# Patient Record
Sex: Male | Born: 1955 | Race: White | Hispanic: No | Smoking: Former smoker
Health system: Southern US, Community
[De-identification: ages and names within clinical notes are randomized; demographics above are authoritative.]

## PROBLEM LIST (undated history)

## (undated) DIAGNOSIS — F319 Bipolar disorder, unspecified: Secondary | ICD-10-CM

## (undated) DIAGNOSIS — Z9289 Personal history of other medical treatment: Secondary | ICD-10-CM

## (undated) DIAGNOSIS — G20 Parkinson's disease: Secondary | ICD-10-CM

## (undated) DIAGNOSIS — J449 Chronic obstructive pulmonary disease, unspecified: Secondary | ICD-10-CM

## (undated) DIAGNOSIS — R7302 Impaired glucose tolerance (oral): Secondary | ICD-10-CM

## (undated) DIAGNOSIS — I10 Essential (primary) hypertension: Secondary | ICD-10-CM

## (undated) DIAGNOSIS — G244 Idiopathic orofacial dystonia: Secondary | ICD-10-CM

## (undated) DIAGNOSIS — R0602 Shortness of breath: Secondary | ICD-10-CM

## (undated) DIAGNOSIS — K219 Gastro-esophageal reflux disease without esophagitis: Secondary | ICD-10-CM

## (undated) DIAGNOSIS — E0590 Thyrotoxicosis, unspecified without thyrotoxic crisis or storm: Secondary | ICD-10-CM

## (undated) DIAGNOSIS — F329 Major depressive disorder, single episode, unspecified: Secondary | ICD-10-CM

## (undated) DIAGNOSIS — K2270 Barrett's esophagus without dysplasia: Secondary | ICD-10-CM

## (undated) DIAGNOSIS — I714 Abdominal aortic aneurysm, without rupture, unspecified: Secondary | ICD-10-CM

## (undated) DIAGNOSIS — I739 Peripheral vascular disease, unspecified: Secondary | ICD-10-CM

## (undated) DIAGNOSIS — N183 Chronic kidney disease, stage 3 unspecified: Secondary | ICD-10-CM

## (undated) DIAGNOSIS — I2510 Atherosclerotic heart disease of native coronary artery without angina pectoris: Secondary | ICD-10-CM

## (undated) DIAGNOSIS — F419 Anxiety disorder, unspecified: Secondary | ICD-10-CM

## (undated) DIAGNOSIS — Z9889 Other specified postprocedural states: Secondary | ICD-10-CM

## (undated) DIAGNOSIS — E782 Mixed hyperlipidemia: Secondary | ICD-10-CM

## (undated) DIAGNOSIS — F259 Schizoaffective disorder, unspecified: Secondary | ICD-10-CM

## (undated) DIAGNOSIS — K5790 Diverticulosis of intestine, part unspecified, without perforation or abscess without bleeding: Secondary | ICD-10-CM

## (undated) DIAGNOSIS — C801 Malignant (primary) neoplasm, unspecified: Secondary | ICD-10-CM

## (undated) DIAGNOSIS — R112 Nausea with vomiting, unspecified: Secondary | ICD-10-CM

## (undated) DIAGNOSIS — Z86010 Personal history of colonic polyps: Secondary | ICD-10-CM

## (undated) DIAGNOSIS — J42 Unspecified chronic bronchitis: Secondary | ICD-10-CM

## (undated) HISTORY — PX: HERNIA REPAIR: SHX51

## (undated) HISTORY — PX: ESOPHAGOGASTRODUODENOSCOPY: SHX1529

## (undated) HISTORY — DX: Mixed hyperlipidemia: E78.2

## (undated) HISTORY — DX: Barrett's esophagus without dysplasia: K22.70

## (undated) HISTORY — DX: Diverticulosis of intestine, part unspecified, without perforation or abscess without bleeding: K57.90

## (undated) HISTORY — DX: Chronic obstructive pulmonary disease, unspecified: J44.9

## (undated) HISTORY — DX: Unspecified chronic bronchitis: J42

## (undated) HISTORY — PX: COLONOSCOPY W/ BIOPSIES: SHX1374

## (undated) HISTORY — DX: Peripheral vascular disease, unspecified: I73.9

## (undated) HISTORY — PX: ESOPHAGEAL DILATION: SHX303

## (undated) HISTORY — DX: Personal history of colonic polyps: Z86.010

## (undated) HISTORY — DX: Gastro-esophageal reflux disease without esophagitis: K21.9

## (undated) HISTORY — PX: PILONIDAL CYST / SINUS EXCISION: SUR543

## (undated) HISTORY — DX: Atherosclerotic heart disease of native coronary artery without angina pectoris: I25.10

## (undated) HISTORY — DX: Thyrotoxicosis, unspecified without thyrotoxic crisis or storm: E05.90

## (undated) HISTORY — DX: Idiopathic orofacial dystonia: G24.4

## (undated) HISTORY — DX: Schizoaffective disorder, unspecified: F25.9

## (undated) HISTORY — DX: Impaired glucose tolerance (oral): R73.02

## (undated) HISTORY — DX: Bipolar disorder, unspecified: F31.9

## (undated) HISTORY — DX: Major depressive disorder, single episode, unspecified: F32.9

## (undated) HISTORY — DX: Parkinson's disease: G20

---

## 2005-03-12 ENCOUNTER — Ambulatory Visit: Payer: Self-pay | Admitting: Internal Medicine

## 2005-04-19 ENCOUNTER — Ambulatory Visit: Payer: Self-pay | Admitting: Internal Medicine

## 2005-05-18 ENCOUNTER — Ambulatory Visit: Payer: Self-pay | Admitting: Internal Medicine

## 2005-05-18 ENCOUNTER — Inpatient Hospital Stay (HOSPITAL_COMMUNITY): Admission: EM | Admit: 2005-05-18 | Discharge: 2005-05-22 | Payer: Self-pay | Admitting: Emergency Medicine

## 2005-05-22 ENCOUNTER — Inpatient Hospital Stay (HOSPITAL_COMMUNITY): Admission: RE | Admit: 2005-05-22 | Discharge: 2005-05-28 | Payer: Self-pay | Admitting: Psychiatry

## 2005-05-22 ENCOUNTER — Ambulatory Visit: Payer: Self-pay | Admitting: Psychiatry

## 2005-06-10 ENCOUNTER — Inpatient Hospital Stay (HOSPITAL_COMMUNITY): Admission: EM | Admit: 2005-06-10 | Discharge: 2005-06-27 | Payer: Self-pay | Admitting: Psychiatry

## 2005-06-10 ENCOUNTER — Encounter: Payer: Self-pay | Admitting: Emergency Medicine

## 2005-06-11 ENCOUNTER — Encounter: Payer: Self-pay | Admitting: Emergency Medicine

## 2005-06-12 ENCOUNTER — Inpatient Hospital Stay (HOSPITAL_COMMUNITY): Admission: AD | Admit: 2005-06-12 | Discharge: 2005-06-14 | Payer: Self-pay | Admitting: Internal Medicine

## 2005-06-15 ENCOUNTER — Ambulatory Visit: Payer: Self-pay | Admitting: Psychiatry

## 2005-08-09 ENCOUNTER — Inpatient Hospital Stay (HOSPITAL_COMMUNITY): Admission: AD | Admit: 2005-08-09 | Discharge: 2005-08-22 | Payer: Self-pay | Admitting: Psychiatry

## 2005-08-10 ENCOUNTER — Ambulatory Visit: Payer: Self-pay | Admitting: Psychiatry

## 2005-12-09 ENCOUNTER — Ambulatory Visit: Payer: Self-pay | Admitting: Internal Medicine

## 2005-12-09 ENCOUNTER — Observation Stay (HOSPITAL_COMMUNITY): Admission: EM | Admit: 2005-12-09 | Discharge: 2005-12-10 | Payer: Self-pay | Admitting: Emergency Medicine

## 2005-12-10 ENCOUNTER — Inpatient Hospital Stay (HOSPITAL_COMMUNITY): Admission: RE | Admit: 2005-12-10 | Discharge: 2005-12-17 | Payer: Self-pay | Admitting: Psychiatry

## 2005-12-11 ENCOUNTER — Ambulatory Visit: Payer: Self-pay | Admitting: Psychiatry

## 2006-01-01 ENCOUNTER — Inpatient Hospital Stay (HOSPITAL_COMMUNITY): Admission: RE | Admit: 2006-01-01 | Discharge: 2006-01-06 | Payer: Self-pay | Admitting: *Deleted

## 2006-01-10 ENCOUNTER — Inpatient Hospital Stay (HOSPITAL_COMMUNITY): Admission: RE | Admit: 2006-01-10 | Discharge: 2006-01-16 | Payer: Self-pay | Admitting: *Deleted

## 2006-01-11 ENCOUNTER — Ambulatory Visit: Payer: Self-pay | Admitting: *Deleted

## 2006-01-25 ENCOUNTER — Inpatient Hospital Stay (HOSPITAL_COMMUNITY): Admission: EM | Admit: 2006-01-25 | Discharge: 2006-01-30 | Payer: Self-pay | Admitting: Psychiatry

## 2006-04-26 IMAGING — CR DG CHEST 1V PORT
1 series · 1 of 1 positions shown · non-contrast
Comparison: Acute abdominal series 06/11/05.
 PORTABLE CHEST - 1 VIEW:

CLINICAL DATA: Nausea, vomiting and diarrhea.

[view not recorded]
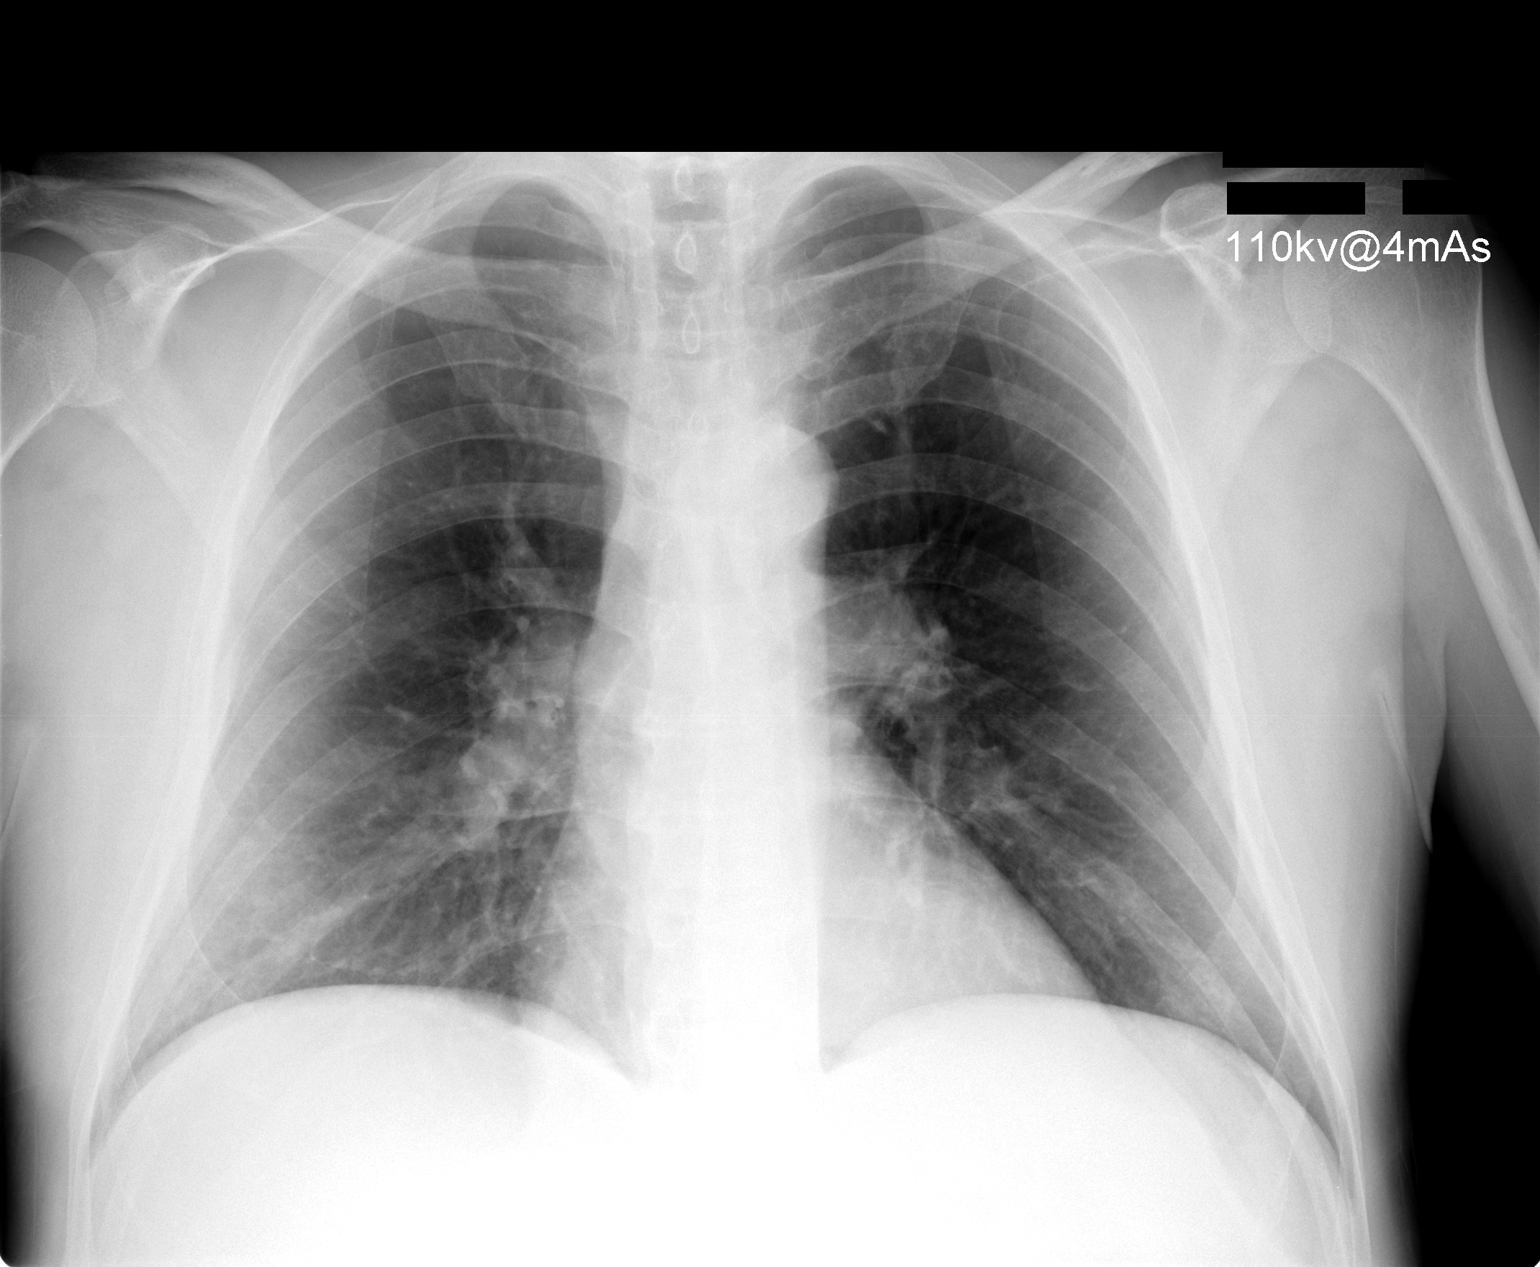

[1 of 1 positions shown; findings below may reference images not displayed]

FINDINGS: Midline trachea.  Heart size normal.  Mediastinal contours within normal limits.  Linear opacity overlying the lower right lung, likely related to minimal subsegmental atelectasis.  Lungs otherwise clear.  No pleural effusion.  Osseous structures are intact.
IMPRESSION: Minimal right basilar subsegmental atelectasis.

## 2006-10-11 ENCOUNTER — Ambulatory Visit: Payer: Self-pay | Admitting: Internal Medicine

## 2006-10-11 LAB — CONVERTED CEMR LAB
ALT: 19 U/L
AST: 17 U/L
Albumin: 3.8 g/dL
Alkaline Phosphatase: 86 U/L
BUN: 11 mg/dL
Basophils Absolute: 0.2 K/uL — ABNORMAL HIGH
Basophils Relative: 2.2 % — ABNORMAL HIGH
Bilirubin, Direct: 0.1 mg/dL
CO2: 32 meq/L
Calcium: 9.5 mg/dL
Chloride: 93 meq/L — ABNORMAL LOW
Cholesterol: 322 mg/dL
Creatinine, Ser: 1.2 mg/dL
Direct LDL: 139.9 mg/dL
Eosinophils Absolute: 0.4 K/uL
Eosinophils Relative: 5 %
GFR calc Af Amer: 83 mL/min
GFR calc non Af Amer: 69 mL/min
Glucose, Bld: 64 mg/dL — ABNORMAL LOW
HCT: 47.9 %
HDL: 44.5 mg/dL
Hemoglobin: 16.4 g/dL
Lymphocytes Relative: 14.1 %
MCHC: 34.3 g/dL
MCV: 101.6 fL — ABNORMAL HIGH
Monocytes Absolute: 1 K/uL — ABNORMAL HIGH
Monocytes Relative: 11 %
Neutro Abs: 6 K/uL
Neutrophils Relative %: 67.7 %
PSA: 0.78 ng/mL
Platelets: 252 K/uL
Potassium: 4.1 meq/L
RBC: 4.71 M/uL
RDW: 12.2 %
Sodium: 131 meq/L — ABNORMAL LOW
TSH: 0.65 u[IU]/mL
Total Bilirubin: 0.6 mg/dL
Total CHOL/HDL Ratio: 7.2
Total Protein: 7 g/dL
Triglycerides: 658 mg/dL
VLDL: 132 mg/dL — ABNORMAL HIGH
WBC: 8.8 10*3/microliter

## 2006-11-19 ENCOUNTER — Ambulatory Visit: Payer: Self-pay | Admitting: Internal Medicine

## 2006-11-19 LAB — CONVERTED CEMR LAB
BUN: 14 mg/dL (ref 6–23)
CO2: 29 meq/L (ref 19–32)
Calcium: 9.6 mg/dL (ref 8.4–10.5)
Chloride: 104 meq/L (ref 96–112)
Cholesterol: 220 mg/dL (ref 0–200)
Creatinine, Ser: 1.4 mg/dL (ref 0.4–1.5)
Direct LDL: 130 mg/dL
GFR calc Af Amer: 70 mL/min
GFR calc non Af Amer: 57 mL/min
Glucose, Bld: 83 mg/dL (ref 70–99)
HDL: 42.1 mg/dL (ref >39.0)
Hgb A1c MFr Bld: 5.7 % (ref 4.6–6.0)
Potassium: 4.8 meq/L (ref 3.5–5.1)
Sodium: 137 meq/L (ref 135–145)
Total CHOL/HDL Ratio: 5.2
Triglycerides: 252 mg/dL (ref 0–149)
VLDL: 50 mg/dL — ABNORMAL HIGH (ref 0–40)

## 2007-05-20 ENCOUNTER — Ambulatory Visit: Payer: Self-pay | Admitting: Internal Medicine

## 2007-05-20 ENCOUNTER — Encounter: Payer: Self-pay | Admitting: Internal Medicine

## 2007-05-20 DIAGNOSIS — R5381 Other malaise: Secondary | ICD-10-CM | POA: Insufficient documentation

## 2007-05-20 DIAGNOSIS — E782 Mixed hyperlipidemia: Secondary | ICD-10-CM

## 2007-05-20 DIAGNOSIS — F259 Schizoaffective disorder, unspecified: Secondary | ICD-10-CM | POA: Insufficient documentation

## 2007-05-20 DIAGNOSIS — Z862 Personal history of diseases of the blood and blood-forming organs and certain disorders involving the immune mechanism: Secondary | ICD-10-CM

## 2007-05-20 DIAGNOSIS — G244 Idiopathic orofacial dystonia: Secondary | ICD-10-CM

## 2007-05-20 DIAGNOSIS — L0501 Pilonidal cyst with abscess: Secondary | ICD-10-CM

## 2007-05-20 DIAGNOSIS — R5383 Other fatigue: Secondary | ICD-10-CM

## 2007-05-20 DIAGNOSIS — Z8639 Personal history of other endocrine, nutritional and metabolic disease: Secondary | ICD-10-CM

## 2007-05-20 HISTORY — DX: Mixed hyperlipidemia: E78.2

## 2007-05-20 HISTORY — DX: Idiopathic orofacial dystonia: G24.4

## 2007-05-20 HISTORY — DX: Schizoaffective disorder, unspecified: F25.9

## 2007-05-20 LAB — CONVERTED CEMR LAB
ALT: 13 units/L (ref 0–53)
AST: 19 units/L (ref 0–37)
Albumin: 4.2 g/dL (ref 3.5–5.2)
Alkaline Phosphatase: 129 units/L — ABNORMAL HIGH (ref 39–117)
BUN: 13 mg/dL (ref 6–23)
Basophils Absolute: 0.1 10*3/uL (ref 0.0–0.1)
Basophils Relative: 1.7 % — ABNORMAL HIGH (ref 0.0–1.0)
Bilirubin, Direct: 0.1 mg/dL (ref 0.0–0.3)
CO2: 26 meq/L (ref 19–32)
Calcium: 9.8 mg/dL (ref 8.4–10.5)
Chloride: 103 meq/L (ref 96–112)
Cholesterol: 209 mg/dL (ref 0–200)
Creatinine, Ser: 1.2 mg/dL (ref 0.4–1.5)
Direct LDL: 126.7 mg/dL
Eosinophils Absolute: 0.3 10*3/uL (ref 0.0–0.6)
Eosinophils Relative: 4.3 % (ref 0.0–5.0)
GFR calc Af Amer: 83 mL/min
GFR calc non Af Amer: 68 mL/min
Glucose, Bld: 89 mg/dL (ref 70–99)
HCT: 40 % (ref 39.0–52.0)
HDL: 47 mg/dL (ref >39.0)
Hemoglobin: 13.4 g/dL (ref 13.0–17.0)
Hgb A1c MFr Bld: 5.5 % (ref 4.6–6.0)
Lymphocytes Relative: 17.1 % (ref 12.0–46.0)
MCHC: 33.5 g/dL (ref 30.0–36.0)
MCV: 100.7 fL — ABNORMAL HIGH (ref 78.0–100.0)
Monocytes Absolute: 0.7 10*3/uL (ref 0.2–0.7)
Monocytes Relative: 8.9 % (ref 3.0–11.0)
Neutro Abs: 5.2 10*3/uL (ref 1.4–7.7)
Neutrophils Relative %: 68 % (ref 43.0–77.0)
Platelets: 399 10*3/uL (ref 150–400)
Potassium: 5 meq/L (ref 3.5–5.1)
RBC: 3.97 M/uL — ABNORMAL LOW (ref 4.22–5.81)
RDW: 12 % (ref 11.5–14.6)
Sodium: 137 meq/L (ref 135–145)
TSH: 0.74 microintl units/mL (ref 0.35–5.50)
Total Bilirubin: 0.6 mg/dL (ref 0.3–1.2)
Total CHOL/HDL Ratio: 4.4
Total Protein: 7.4 g/dL (ref 6.0–8.3)
Triglycerides: 138 mg/dL (ref 0–149)
VLDL: 28 mg/dL (ref 0–40)
WBC: 7.6 10*3/uL (ref 4.5–10.5)

## 2007-09-29 ENCOUNTER — Ambulatory Visit: Payer: Self-pay | Admitting: Internal Medicine

## 2007-09-29 DIAGNOSIS — F17200 Nicotine dependence, unspecified, uncomplicated: Secondary | ICD-10-CM

## 2008-02-13 ENCOUNTER — Emergency Department (HOSPITAL_COMMUNITY): Admission: EM | Admit: 2008-02-13 | Discharge: 2008-02-14 | Payer: Self-pay | Admitting: Emergency Medicine

## 2008-02-14 ENCOUNTER — Emergency Department (HOSPITAL_COMMUNITY): Admission: EM | Admit: 2008-02-14 | Discharge: 2008-02-14 | Payer: Self-pay | Admitting: Emergency Medicine

## 2008-03-20 ENCOUNTER — Inpatient Hospital Stay (HOSPITAL_COMMUNITY): Admission: EM | Admit: 2008-03-20 | Discharge: 2008-04-05 | Payer: Self-pay | Admitting: Psychiatry

## 2008-03-20 ENCOUNTER — Ambulatory Visit: Payer: Self-pay | Admitting: Psychiatry

## 2008-04-18 ENCOUNTER — Emergency Department (HOSPITAL_COMMUNITY): Admission: EM | Admit: 2008-04-18 | Discharge: 2008-04-18 | Payer: Self-pay | Admitting: Emergency Medicine

## 2008-08-18 ENCOUNTER — Ambulatory Visit: Payer: Self-pay | Admitting: Internal Medicine

## 2008-08-18 DIAGNOSIS — F329 Major depressive disorder, single episode, unspecified: Secondary | ICD-10-CM

## 2008-08-18 DIAGNOSIS — E0590 Thyrotoxicosis, unspecified without thyrotoxic crisis or storm: Secondary | ICD-10-CM | POA: Insufficient documentation

## 2008-08-18 DIAGNOSIS — F3289 Other specified depressive episodes: Secondary | ICD-10-CM

## 2008-08-18 HISTORY — DX: Other specified depressive episodes: F32.89

## 2008-08-18 HISTORY — DX: Thyrotoxicosis, unspecified without thyrotoxic crisis or storm: E05.90

## 2008-08-18 HISTORY — DX: Major depressive disorder, single episode, unspecified: F32.9

## 2008-08-19 LAB — CONVERTED CEMR LAB
ALT: 14 units/L (ref 0–53)
AST: 15 units/L (ref 0–37)
Albumin: 3.7 g/dL (ref 3.5–5.2)
Alkaline Phosphatase: 75 units/L (ref 39–117)
BUN: 17 mg/dL (ref 6–23)
Basophils Absolute: 0 10*3/uL (ref 0.0–0.1)
Basophils Relative: 0 % (ref 0.0–3.0)
Bilirubin, Direct: 0.1 mg/dL (ref 0.0–0.3)
CO2: 29 meq/L (ref 19–32)
Calcium: 9.4 mg/dL (ref 8.4–10.5)
Chloride: 102 meq/L (ref 96–112)
Cholesterol: 209 mg/dL (ref 0–200)
Creatinine, Ser: 1.3 mg/dL (ref 0.4–1.5)
Direct LDL: 124.5 mg/dL
Eosinophils Absolute: 0.1 10*3/uL (ref 0.0–0.7)
Eosinophils Relative: 1 % (ref 0.0–5.0)
Folate: >20 ng/mL
GFR calc Af Amer: 75 mL/min
GFR calc non Af Amer: 62 mL/min
Glucose, Bld: 77 mg/dL (ref 70–99)
HCT: 41.6 % (ref 39.0–52.0)
HDL: 43.1 mg/dL (ref >39.0)
Hemoglobin: 14.7 g/dL (ref 13.0–17.0)
Hgb A1c MFr Bld: 5.7 % (ref 4.6–6.0)
Lymphocytes Relative: 11.5 % — ABNORMAL LOW (ref 12.0–46.0)
MCHC: 35.3 g/dL (ref 30.0–36.0)
MCV: 97.8 fL (ref 78.0–100.0)
Monocytes Absolute: 0.7 10*3/uL (ref 0.1–1.0)
Monocytes Relative: 5.9 % (ref 3.0–12.0)
Neutro Abs: 9 10*3/uL — ABNORMAL HIGH (ref 1.4–7.7)
Neutrophils Relative %: 81.6 % — ABNORMAL HIGH (ref 43.0–77.0)
Platelets: 332 10*3/uL (ref 150–400)
Potassium: 3.9 meq/L (ref 3.5–5.1)
RBC: 4.26 M/uL (ref 4.22–5.81)
RDW: 12 % (ref 11.5–14.6)
Sed Rate: 13 mm/hr (ref 0–16)
Sodium: 138 meq/L (ref 135–145)
TSH: 0.09 microintl units/mL — ABNORMAL LOW (ref 0.35–5.50)
Total Bilirubin: 0.4 mg/dL (ref 0.3–1.2)
Total CHOL/HDL Ratio: 4.8
Total Protein: 6.9 g/dL (ref 6.0–8.3)
Triglycerides: 243 mg/dL (ref 0–149)
VLDL: 49 mg/dL — ABNORMAL HIGH (ref 0–40)
Vitamin B-12: 433 pg/mL (ref 211–911)
WBC: 11.1 10*3/uL — ABNORMAL HIGH (ref 4.5–10.5)

## 2008-10-07 ENCOUNTER — Ambulatory Visit: Payer: Self-pay | Admitting: Endocrinology

## 2008-10-07 LAB — CONVERTED CEMR LAB
Free T4: 0.8 ng/dL (ref 0.6–1.6)
TSH: 0.33 microintl units/mL — ABNORMAL LOW (ref 0.35–5.50)

## 2008-12-01 ENCOUNTER — Ambulatory Visit: Payer: Self-pay | Admitting: Internal Medicine

## 2008-12-01 DIAGNOSIS — K219 Gastro-esophageal reflux disease without esophagitis: Secondary | ICD-10-CM

## 2008-12-01 DIAGNOSIS — L0390 Cellulitis, unspecified: Secondary | ICD-10-CM

## 2008-12-01 DIAGNOSIS — L0291 Cutaneous abscess, unspecified: Secondary | ICD-10-CM

## 2008-12-01 HISTORY — DX: Gastro-esophageal reflux disease without esophagitis: K21.9

## 2009-03-15 ENCOUNTER — Telehealth: Payer: Self-pay | Admitting: Internal Medicine

## 2009-03-16 ENCOUNTER — Encounter (INDEPENDENT_AMBULATORY_CARE_PROVIDER_SITE_OTHER): Payer: Self-pay | Admitting: *Deleted

## 2009-03-23 ENCOUNTER — Ambulatory Visit: Payer: Self-pay | Admitting: Internal Medicine

## 2009-03-23 DIAGNOSIS — R079 Chest pain, unspecified: Secondary | ICD-10-CM

## 2009-03-23 LAB — CONVERTED CEMR LAB
ALT: 13 units/L (ref 0–53)
AST: 16 units/L (ref 0–37)
Albumin: 3.8 g/dL (ref 3.5–5.2)
Alkaline Phosphatase: 63 units/L (ref 39–117)
BUN: 12 mg/dL (ref 6–23)
Basophils Absolute: 0 10*3/uL (ref 0.0–0.1)
Basophils Relative: 0 % (ref 0.0–3.0)
Bilirubin Urine: NEGATIVE
Bilirubin, Direct: 0 mg/dL (ref 0.0–0.3)
CO2: 28 meq/L (ref 19–32)
Calcium: 9.3 mg/dL (ref 8.4–10.5)
Chloride: 105 meq/L (ref 96–112)
Cholesterol: 241 mg/dL — ABNORMAL HIGH (ref 0–200)
Creatinine, Ser: 1.3 mg/dL (ref 0.4–1.5)
Direct LDL: 141.1 mg/dL
Eosinophils Absolute: 0.1 10*3/uL (ref 0.0–0.7)
Eosinophils Relative: 1.7 % (ref 0.0–5.0)
GFR calc non Af Amer: 61.83 mL/min (ref >60)
Glucose, Bld: 89 mg/dL (ref 70–99)
HCT: 36.8 % — ABNORMAL LOW (ref 39.0–52.0)
HDL: 33 mg/dL — ABNORMAL LOW (ref >39.00)
Hemoglobin, Urine: NEGATIVE
Hemoglobin: 13 g/dL (ref 13.0–17.0)
Hgb A1c MFr Bld: 5.6 % (ref 4.6–6.5)
Ketones, ur: NEGATIVE mg/dL
Leukocytes, UA: NEGATIVE
Lymphocytes Relative: 14.7 % (ref 12.0–46.0)
Lymphs Abs: 1 10*3/uL (ref 0.7–4.0)
MCHC: 35.2 g/dL (ref 30.0–36.0)
MCV: 98.1 fL (ref 78.0–100.0)
Monocytes Absolute: 0.6 10*3/uL (ref 0.1–1.0)
Monocytes Relative: 8.4 % (ref 3.0–12.0)
Neutro Abs: 5.3 10*3/uL (ref 1.4–7.7)
Neutrophils Relative %: 75.2 % (ref 43.0–77.0)
Nitrite: NEGATIVE
PSA: 1.09 ng/mL (ref 0.10–4.00)
Platelets: 323 10*3/uL (ref 150.0–400.0)
Potassium: 4 meq/L (ref 3.5–5.1)
RBC: 3.75 M/uL — ABNORMAL LOW (ref 4.22–5.81)
RDW: 12.7 % (ref 11.5–14.6)
Sodium: 140 meq/L (ref 135–145)
Specific Gravity, Urine: <=1.005 (ref 1.000–1.030)
TSH: 0.59 microintl units/mL (ref 0.35–5.50)
Total Bilirubin: 0.5 mg/dL (ref 0.3–1.2)
Total CHOL/HDL Ratio: 7
Total Protein, Urine: 30 mg/dL
Total Protein: 7.2 g/dL (ref 6.0–8.3)
Triglycerides: 313 mg/dL — ABNORMAL HIGH (ref 0.0–149.0)
Urine Glucose: NEGATIVE mg/dL
Urobilinogen, UA: 0.2 (ref 0.0–1.0)
VLDL: 62.6 mg/dL — ABNORMAL HIGH (ref 0.0–40.0)
WBC: 7 10*3/uL (ref 4.5–10.5)
pH: 6.5 (ref 5.0–8.0)

## 2009-03-28 ENCOUNTER — Encounter (INDEPENDENT_AMBULATORY_CARE_PROVIDER_SITE_OTHER): Payer: Self-pay | Admitting: *Deleted

## 2009-03-28 ENCOUNTER — Telehealth (INDEPENDENT_AMBULATORY_CARE_PROVIDER_SITE_OTHER): Payer: Self-pay | Admitting: *Deleted

## 2009-03-31 ENCOUNTER — Telehealth (INDEPENDENT_AMBULATORY_CARE_PROVIDER_SITE_OTHER): Payer: Self-pay | Admitting: Radiology

## 2009-04-04 ENCOUNTER — Ambulatory Visit: Payer: Self-pay

## 2009-04-04 ENCOUNTER — Encounter: Payer: Self-pay | Admitting: Cardiology

## 2009-04-20 ENCOUNTER — Telehealth: Payer: Self-pay | Admitting: Internal Medicine

## 2009-04-20 DIAGNOSIS — R9431 Abnormal electrocardiogram [ECG] [EKG]: Secondary | ICD-10-CM

## 2009-04-25 ENCOUNTER — Encounter (INDEPENDENT_AMBULATORY_CARE_PROVIDER_SITE_OTHER): Payer: Self-pay | Admitting: *Deleted

## 2009-05-25 DIAGNOSIS — G20A1 Parkinson's disease without dyskinesia, without mention of fluctuations: Secondary | ICD-10-CM

## 2009-05-25 DIAGNOSIS — G20 Parkinson's disease: Secondary | ICD-10-CM

## 2009-05-25 DIAGNOSIS — F319 Bipolar disorder, unspecified: Secondary | ICD-10-CM

## 2009-05-25 HISTORY — DX: Bipolar disorder, unspecified: F31.9

## 2009-05-25 HISTORY — DX: Parkinson's disease without dyskinesia, without mention of fluctuations: G20.A1

## 2009-05-25 HISTORY — DX: Parkinson's disease: G20

## 2009-05-27 ENCOUNTER — Ambulatory Visit: Payer: Self-pay | Admitting: Cardiovascular Disease

## 2009-05-27 ENCOUNTER — Encounter (INDEPENDENT_AMBULATORY_CARE_PROVIDER_SITE_OTHER): Payer: Self-pay | Admitting: *Deleted

## 2009-06-13 ENCOUNTER — Ambulatory Visit: Payer: Self-pay | Admitting: Cardiovascular Disease

## 2009-06-15 LAB — CONVERTED CEMR LAB
BUN: 11 mg/dL (ref 6–23)
Basophils Absolute: 0 10*3/uL (ref 0.0–0.1)
Basophils Relative: 0.8 % (ref 0.0–3.0)
CO2: 30 meq/L (ref 19–32)
Calcium: 8.7 mg/dL (ref 8.4–10.5)
Chloride: 104 meq/L (ref 96–112)
Creatinine, Ser: 1.2 mg/dL (ref 0.4–1.5)
Eosinophils Absolute: 0.2 10*3/uL (ref 0.0–0.7)
Eosinophils Relative: 3 % (ref 0.0–5.0)
GFR calc non Af Amer: 67.75 mL/min (ref >60)
Glucose, Bld: 77 mg/dL (ref 70–99)
HCT: 39 % (ref 39.0–52.0)
Hemoglobin: 13.8 g/dL (ref 13.0–17.0)
INR: 0.9 (ref 0.8–1.0)
Lymphocytes Relative: 21.9 % (ref 12.0–46.0)
Lymphs Abs: 1.2 10*3/uL (ref 0.7–4.0)
MCHC: 35.3 g/dL (ref 30.0–36.0)
MCV: 101.4 fL — ABNORMAL HIGH (ref 78.0–100.0)
Monocytes Absolute: 0.6 10*3/uL (ref 0.1–1.0)
Monocytes Relative: 11 % (ref 3.0–12.0)
Neutro Abs: 3.5 10*3/uL (ref 1.4–7.7)
Neutrophils Relative %: 63.3 % (ref 43.0–77.0)
Platelets: 261 10*3/uL (ref 150.0–400.0)
Potassium: 4 meq/L (ref 3.5–5.1)
Prothrombin Time: 9.7 s (ref 9.1–11.7)
RBC: 3.85 M/uL — ABNORMAL LOW (ref 4.22–5.81)
RDW: 12 % (ref 11.5–14.6)
Sodium: 140 meq/L (ref 135–145)
WBC: 5.5 10*3/uL (ref 4.5–10.5)

## 2009-06-16 ENCOUNTER — Inpatient Hospital Stay (HOSPITAL_BASED_OUTPATIENT_CLINIC_OR_DEPARTMENT_OTHER): Admission: RE | Admit: 2009-06-16 | Discharge: 2009-06-16 | Payer: Self-pay | Admitting: Cardiovascular Disease

## 2009-06-16 ENCOUNTER — Ambulatory Visit: Payer: Self-pay | Admitting: Cardiovascular Disease

## 2009-06-16 HISTORY — PX: CARDIAC CATHETERIZATION: SHX172

## 2009-07-13 ENCOUNTER — Ambulatory Visit: Payer: Self-pay | Admitting: Cardiovascular Disease

## 2009-10-20 ENCOUNTER — Ambulatory Visit: Payer: Self-pay | Admitting: Internal Medicine

## 2009-10-20 DIAGNOSIS — I2510 Atherosclerotic heart disease of native coronary artery without angina pectoris: Secondary | ICD-10-CM

## 2009-10-20 HISTORY — DX: Atherosclerotic heart disease of native coronary artery without angina pectoris: I25.10

## 2009-10-21 LAB — CONVERTED CEMR LAB
ALT: 13 units/L (ref 0–53)
AST: 16 units/L (ref 0–37)
Albumin: 3.9 g/dL (ref 3.5–5.2)
Alkaline Phosphatase: 66 units/L (ref 39–117)
BUN: 10 mg/dL (ref 6–23)
Basophils Absolute: 0 10*3/uL (ref 0.0–0.1)
Basophils Relative: 0.9 % (ref 0.0–3.0)
Bilirubin Urine: NEGATIVE
Bilirubin, Direct: 0.1 mg/dL (ref 0.0–0.3)
CO2: 29 meq/L (ref 19–32)
Calcium: 9 mg/dL (ref 8.4–10.5)
Chloride: 107 meq/L (ref 96–112)
Cholesterol: 344 mg/dL — ABNORMAL HIGH (ref 0–200)
Creatinine, Ser: 1.4 mg/dL (ref 0.4–1.5)
Direct LDL: 160.9 mg/dL
Eosinophils Absolute: 0.2 10*3/uL (ref 0.0–0.7)
Eosinophils Relative: 3.1 % (ref 0.0–5.0)
Folate: 15.1 ng/mL
GFR calc non Af Amer: 56.63 mL/min (ref >60)
Glucose, Bld: 81 mg/dL (ref 70–99)
HCT: 40.3 % (ref 39.0–52.0)
HDL: 47.3 mg/dL (ref >39.00)
Hemoglobin, Urine: NEGATIVE
Hemoglobin: 13.4 g/dL (ref 13.0–17.0)
Iron: 91 ug/dL (ref 42–165)
Ketones, ur: NEGATIVE mg/dL
Leukocytes, UA: NEGATIVE
Lymphocytes Relative: 24.9 % (ref 12.0–46.0)
Lymphs Abs: 1.3 10*3/uL (ref 0.7–4.0)
MCHC: 33.2 g/dL (ref 30.0–36.0)
MCV: 99.1 fL (ref 78.0–100.0)
Monocytes Absolute: 0.5 10*3/uL (ref 0.1–1.0)
Monocytes Relative: 9.1 % (ref 3.0–12.0)
Neutro Abs: 3.4 10*3/uL (ref 1.4–7.7)
Neutrophils Relative %: 62 % (ref 43.0–77.0)
Nitrite: NEGATIVE
PSA: 0.86 ng/mL (ref 0.10–4.00)
Platelets: 263 10*3/uL (ref 150.0–400.0)
Potassium: 4 meq/L (ref 3.5–5.1)
RBC: 4.06 M/uL — ABNORMAL LOW (ref 4.22–5.81)
RDW: 12.6 % (ref 11.5–14.6)
Saturation Ratios: 26.5 % (ref 20.0–50.0)
Sed Rate: 12 mm/hr (ref 0–22)
Sodium: 140 meq/L (ref 135–145)
Specific Gravity, Urine: <=1.005 (ref 1.000–1.030)
TSH: 0.85 microintl units/mL (ref 0.35–5.50)
Total Bilirubin: 0.6 mg/dL (ref 0.3–1.2)
Total CHOL/HDL Ratio: 7
Total Protein, Urine: 30 mg/dL
Total Protein: 7.4 g/dL (ref 6.0–8.3)
Transferrin: 244.9 mg/dL (ref 212.0–360.0)
Triglycerides: 570 mg/dL — ABNORMAL HIGH (ref 0.0–149.0)
Urine Glucose: NEGATIVE mg/dL
Urobilinogen, UA: 0.2 (ref 0.0–1.0)
VLDL: 114 mg/dL — ABNORMAL HIGH (ref 0.0–40.0)
Vit D, 25-Hydroxy: 17 ng/mL — ABNORMAL LOW (ref 30–89)
Vitamin B-12: 734 pg/mL (ref 211–911)
WBC: 5.4 10*3/uL (ref 4.5–10.5)
pH: 6 (ref 5.0–8.0)

## 2009-11-17 ENCOUNTER — Ambulatory Visit: Payer: Self-pay | Admitting: Internal Medicine

## 2009-11-17 LAB — CONVERTED CEMR LAB
ALT: 16 units/L (ref 0–53)
AST: 16 units/L (ref 0–37)
Albumin: 3.9 g/dL (ref 3.5–5.2)
Alkaline Phosphatase: 66 units/L (ref 39–117)
Bilirubin, Direct: 0.1 mg/dL (ref 0.0–0.3)
Cholesterol: 253 mg/dL — ABNORMAL HIGH (ref 0–200)
Direct LDL: 134.9 mg/dL
HDL: 45.2 mg/dL (ref >39.00)
Total Bilirubin: 0.5 mg/dL (ref 0.3–1.2)
Total CHOL/HDL Ratio: 6
Total Protein: 7.3 g/dL (ref 6.0–8.3)
Triglycerides: 351 mg/dL — ABNORMAL HIGH (ref 0.0–149.0)
VLDL: 70.2 mg/dL — ABNORMAL HIGH (ref 0.0–40.0)

## 2010-08-22 ENCOUNTER — Ambulatory Visit
Admission: RE | Admit: 2010-08-22 | Discharge: 2010-08-22 | Payer: Self-pay | Source: Home / Self Care | Attending: Internal Medicine | Admitting: Internal Medicine

## 2010-08-22 ENCOUNTER — Encounter: Payer: Self-pay | Admitting: Internal Medicine

## 2010-08-22 ENCOUNTER — Other Ambulatory Visit: Payer: Self-pay | Admitting: Internal Medicine

## 2010-08-22 DIAGNOSIS — J4489 Other specified chronic obstructive pulmonary disease: Secondary | ICD-10-CM

## 2010-08-22 DIAGNOSIS — J439 Emphysema, unspecified: Secondary | ICD-10-CM | POA: Insufficient documentation

## 2010-08-22 DIAGNOSIS — L989 Disorder of the skin and subcutaneous tissue, unspecified: Secondary | ICD-10-CM | POA: Insufficient documentation

## 2010-08-22 DIAGNOSIS — E739 Lactose intolerance, unspecified: Secondary | ICD-10-CM | POA: Insufficient documentation

## 2010-08-22 DIAGNOSIS — J449 Chronic obstructive pulmonary disease, unspecified: Secondary | ICD-10-CM

## 2010-08-22 DIAGNOSIS — R05 Cough: Secondary | ICD-10-CM | POA: Insufficient documentation

## 2010-08-22 DIAGNOSIS — R059 Cough, unspecified: Secondary | ICD-10-CM | POA: Insufficient documentation

## 2010-08-22 HISTORY — DX: Chronic obstructive pulmonary disease, unspecified: J44.9

## 2010-08-22 HISTORY — DX: Other specified chronic obstructive pulmonary disease: J44.89

## 2010-08-22 LAB — HEPATIC FUNCTION PANEL
ALT: 30 U/L (ref 0–53)
AST: 33 U/L (ref 0–37)
Albumin: 3.8 g/dL (ref 3.5–5.2)
Alkaline Phosphatase: 64 U/L (ref 39–117)
Bilirubin, Direct: 0.1 mg/dL (ref 0.0–0.3)
Total Bilirubin: 0.4 mg/dL (ref 0.3–1.2)
Total Protein: 7 g/dL (ref 6.0–8.3)

## 2010-08-22 LAB — URINALYSIS
Bilirubin Urine: NEGATIVE
Ketones, ur: NEGATIVE
Leukocytes, UA: NEGATIVE
Nitrite: NEGATIVE
Specific Gravity, Urine: <=1.005 (ref 1.000–1.030)
Urine Glucose: NEGATIVE
Urobilinogen, UA: 0.2 (ref 0.0–1.0)
pH: 6.5 (ref 5.0–8.0)

## 2010-08-22 LAB — CBC WITH DIFFERENTIAL/PLATELET
Basophils Absolute: 0 10*3/uL (ref 0.0–0.1)
Basophils Relative: 0.4 % (ref 0.0–3.0)
Eosinophils Absolute: 0.3 10*3/uL (ref 0.0–0.7)
Eosinophils Relative: 4.7 % (ref 0.0–5.0)
HCT: 39 % (ref 39.0–52.0)
Hemoglobin: 13.6 g/dL (ref 13.0–17.0)
Lymphocytes Relative: 19.9 % (ref 12.0–46.0)
Lymphs Abs: 1.1 10*3/uL (ref 0.7–4.0)
MCHC: 34.9 g/dL (ref 30.0–36.0)
MCV: 101.8 fl — ABNORMAL HIGH (ref 78.0–100.0)
Monocytes Absolute: 0.6 10*3/uL (ref 0.1–1.0)
Monocytes Relative: 10.7 % (ref 3.0–12.0)
Neutro Abs: 3.7 10*3/uL (ref 1.4–7.7)
Neutrophils Relative %: 64.3 % (ref 43.0–77.0)
Platelets: 249 10*3/uL (ref 150.0–400.0)
RBC: 3.82 Mil/uL — ABNORMAL LOW (ref 4.22–5.81)
RDW: 13.4 % (ref 11.5–14.6)
WBC: 5.8 10*3/uL (ref 4.5–10.5)

## 2010-08-22 LAB — LIPID PANEL
Cholesterol: 199 mg/dL (ref 0–200)
HDL: 44 mg/dL (ref >39.00)
Total CHOL/HDL Ratio: 5
Triglycerides: 240 mg/dL — ABNORMAL HIGH (ref 0.0–149.0)
VLDL: 48 mg/dL — ABNORMAL HIGH (ref 0.0–40.0)

## 2010-08-22 LAB — TSH: TSH: 0.57 u[IU]/mL (ref 0.35–5.50)

## 2010-08-22 LAB — BASIC METABOLIC PANEL
BUN: 15 mg/dL (ref 6–23)
CO2: 29 mEq/L (ref 19–32)
Calcium: 9 mg/dL (ref 8.4–10.5)
Chloride: 104 mEq/L (ref 96–112)
Creatinine, Ser: 1.3 mg/dL (ref 0.4–1.5)
GFR: 64.33 mL/min (ref >60.00)
Glucose, Bld: 91 mg/dL (ref 70–99)
Potassium: 4.3 mEq/L (ref 3.5–5.1)
Sodium: 141 mEq/L (ref 135–145)

## 2010-08-22 LAB — PSA: PSA: 0.99 ng/mL (ref 0.10–4.00)

## 2010-08-22 LAB — LDL CHOLESTEROL, DIRECT: Direct LDL: 116.4 mg/dL

## 2010-08-22 LAB — HEMOGLOBIN A1C: Hgb A1c MFr Bld: 5.9 % (ref 4.6–6.5)

## 2010-08-24 ENCOUNTER — Encounter: Payer: Self-pay | Admitting: Internal Medicine

## 2010-09-12 NOTE — Assessment & Plan Note (Signed)
Summary: PER PT 4 WK FU  STC   Vital Signs:  Patient profile:   55 year old male Height:      68.5 inches Weight:      176 pounds BMI:     26.47 O2 Sat:      94 % on Room air Temp:     98.4 degrees F oral Pulse rate:   75 / minute BP sitting:   112 / 80  (left arm) Cuff size:   regular  Vitals Entered ByZella Ball Ewing (November 17, 2009 2:32 PM)  O2 Flow:  Room air CC: 4 week followup/RE   Primary Care Provider:  Corwin Levins MD  CC:  4 week followup/RE.  History of Present Illness: quit smoking for 2 mo;  with some wt gain and higher cholesterol diet he admits;  has had increased reflux symptoms as well mostly at night, but during the day as well; no dysphagia, n/v, wt loss, bowel habit change, or blood.  Pt denies CP, sob, doe, wheezing, orthopnea, pnd, worsening LE edema, palps, dizziness or syncope   Pt denies new neuro symptoms such as headache, facial or extremity weakness .  Denies polydipsia or polyuria or known elev blood sugar recently with the wt gain.    Problems Prior to Update: 1)  Hepatotoxicity, Drug-induced, Risk of  (ICD-V58.69) 2)  Preventive Health Care  (ICD-V70.0) 3)  Coronary Artery Disease  (ICD-414.00) 4)  Hyperlipidemia  (ICD-272.2) 5)  Chest Pain  (ICD-786.50) 6)  Hyperlipidemia  (ICD-272.4) 7)  Parkinson's Disease  (ICD-332.0) 8)  Bipolar Disorder Unspecified  (ICD-296.80) 9)  Abnormal Stress Electrocardiogram  (ICD-794.31) 10)  Gerd  (ICD-530.81) 11)  Cellulitis  (ICD-682.9) 12)  Hyperthyroidism  (ICD-242.90) 13)  Special Screening Malig Neoplasms Other Sites  (ICD-V76.49) 14)  Depression  (ICD-311) 15)  Fatigue  (ICD-780.79) 16)  Smoker  (ICD-305.1) 17)  Fatigue  (ICD-780.79) 18)  Family History Diabetes 1st Degree Relative  (ICD-V18.0) 19)  Pilonidal Cyst, With Abscess  (ICD-685.0) 20)  Tardive Dyskinesia  (ICD-333.82) 21)  Schizoaffective Disorder  (ICD-295.70) 22)  Glucose Intolerance, Hx of  (ICD-V12.2)  Medications Prior to  Update: 1)  Benztropine Mesylate 1 Mg  Tabs (Benztropine Mesylate) .Marland Kitchen.. 1po Bid 2)  Haloperidol 5 Mg Tabs (Haloperidol) .Marland Kitchen.. 1 Tab By Mouth Once Daily 3)  Fluoxetine Hcl 20 Mg Tabs (Fluoxetine Hcl) .Marland Kitchen.. 1po Once Daily 4)  Depakote 500 Mg Tbec (Divalproex Sodium) .... 2po Qhs 5)  Diphenhydramine Hcl 50 Mg Caps (Diphenhydramine Hcl) 6)  Simvastatin 40 Mg Tabs (Simvastatin) .Marland Kitchen.. 1 By Mouth Once Daily  Current Medications (verified): 1)  Benztropine Mesylate 1 Mg  Tabs (Benztropine Mesylate) .Marland Kitchen.. 1po Bid 2)  Haloperidol 5 Mg Tabs (Haloperidol) .Marland Kitchen.. 1 Tab By Mouth Once Daily 3)  Fluoxetine Hcl 20 Mg Tabs (Fluoxetine Hcl) .Marland Kitchen.. 1po Once Daily 4)  Depakote 500 Mg Tbec (Divalproex Sodium) .... 2po Qhs 5)  Diphenhydramine Hcl 50 Mg Caps (Diphenhydramine Hcl) 6)  Simvastatin 40 Mg Tabs (Simvastatin) .Marland Kitchen.. 1 By Mouth Once Daily 7)  Zantac 300 Mg Tabs (Ranitidine Hcl) .Marland Kitchen.. 1po At Bedtime  Allergies (verified): 1)  ! Sulfa  Past History:  Past Medical History: Last updated: 10/20/2009 Current Problems:  HYPERLIPIDEMIA (ICD-272.2) CHEST PAIN (ICD-786.50) HYPERLIPIDEMIA (ICD-272.4) PARKINSON'S DISEASE (ICD-332.0) BIPOLAR DISORDER UNSPECIFIED (ICD-296.80) ABNORMAL STRESS ELECTROCARDIOGRAM (ICD-794.31) GERD (ICD-530.81) CELLULITIS (ICD-682.9) HYPERTHYROIDISM (ICD-242.90) SPECIAL SCREENING MALIG NEOPLASMS OTHER SITES (ICD-V76.49) DEPRESSION (ICD-311) FATIGUE (ICD-780.79) SMOKER (ICD-305.1) FATIGUE (ICD-780.79) FAMILY HISTORY DIABETES 1ST DEGREE RELATIVE (ICD-V18.0)  PILONIDAL CYST, WITH ABSCESS (ICD-685.0) TARDIVE DYSKINESIA (ICD-333.82) SCHIZOAFFECTIVE DISORDER (ICD-295.70) GLUCOSE INTOLERANCE, HX OF (ICD-V12.2) tardive dyskinesia Coronary artery disease  Past Surgical History: Last updated: 08/18/2008 s/p pilonidal cyst x 2  Social History: Last updated: 10/20/2009 Alcohol use-no Divorced 1 daughter disabled - psych Former Smoker Drug use-no  Risk Factors: Smoking Status:  quit (10/20/2009)  Review of Systems       all otherwise negative per pt -    Physical Exam  General:  alert and overweight-appearing.   Head:  normocephalic and atraumatic.   Eyes:  vision grossly intact, pupils equal, and pupils round.   Ears:  R ear normal and L ear normal.   Nose:  no external deformity and no nasal discharge.   Mouth:  no gingival abnormalities and pharynx pink and moist.   Neck:  supple and no masses.   Lungs:  normal respiratory effort and normal breath sounds.   Heart:  normal rate and regular rhythm.   Abdomen:  soft, non-tender, and normal bowel sounds.   Extremities:  no edema, no erythema    Impression & Recommendations:  Problem # 1:  HYPERLIPIDEMIA (ICD-272.2)  His updated medication list for this problem includes:    Simvastatin 40 Mg Tabs (Simvastatin) .Marland Kitchen... 1 by mouth once daily  Orders: TLB-Lipid Panel (80061-LIPID)  Labs Reviewed: SGOT: 16 (10/20/2009)   SGPT: 13 (10/20/2009)   HDL:47.30 (10/20/2009), 33.00 (03/23/2009)  LDL:DEL (08/18/2008), DEL (05/20/2007)  Chol:344 (10/20/2009), 241 (03/23/2009)  Trig:570.0 (10/20/2009), 313.0 (03/23/2009) Pt to continue diet efforts, good med tolerance; to check labs - goal LDL less than 70 , stable overall by hx and exam, ok to continue meds/tx as is   Problem # 2:  GERD (ICD-530.81)  His updated medication list for this problem includes:    Zantac 300 Mg Tabs (Ranitidine hcl) .Marland Kitchen... 1po at bedtime finances are severe - treat as above, f/u any worsening signs or symptoms - on $4 lists;  to f/u any worsening symptoms  Problem # 3:  GLUCOSE INTOLERANCE, HX OF (ICD-V12.2) asympt - to follow, encouraged wt loss and lower calorie diet  Complete Medication List: 1)  Benztropine Mesylate 1 Mg Tabs (Benztropine mesylate) .Marland Kitchen.. 1po bid 2)  Haloperidol 5 Mg Tabs (Haloperidol) .Marland Kitchen.. 1 tab by mouth once daily 3)  Fluoxetine Hcl 20 Mg Tabs (Fluoxetine hcl) .Marland Kitchen.. 1po once daily 4)  Depakote 500 Mg Tbec  (Divalproex sodium) .... 2po qhs 5)  Diphenhydramine Hcl 50 Mg Caps (Diphenhydramine hcl) 6)  Simvastatin 40 Mg Tabs (Simvastatin) .Marland Kitchen.. 1 by mouth once daily 7)  Zantac 300 Mg Tabs (Ranitidine hcl) .Marland Kitchen.. 1po at bedtime  Other Orders: TLB-Hepatic/Liver Function Pnl (80076-HEPATIC)  Patient Instructions: 1)  Please go to the Lab in the basement for your blood and/or urine tests today  2)  Please take all new medications as prescribed - the generic zantac at night 3)  Continue all previous medications as before this visit  4)  Please schedule a follow-up appointment in 1 year or sooner if needed Prescriptions: ZANTAC 300 MG TABS (RANITIDINE HCL) 1po at bedtime  #90 x 3   Entered and Authorized by:   Corwin Levins MD   Signed by:   Corwin Levins MD on 11/17/2009   Method used:   Electronically to        [facility] [phone]* (retail)       [address]       [place], [place]  [address]  Ph: [phone]       Fax: [phone]   RxID:   [id number]

## 2010-09-12 NOTE — Assessment & Plan Note (Signed)
Summary: f/u appt/#/cd   Vital Signs:  Patient profile:   55 year old male Height:      68 inches Weight:      173.75 pounds BMI:     26.51 O2 Sat:      96 % on Room air Temp:     99.5 degrees F oral Pulse rate:   78 / minute BP sitting:   148 / 90  (left arm) Cuff size:   regular  Vitals Entered ByMarland Kitchen Zella Ball Ewing (October 20, 2009 1:59 PM)  O2 Flow:  Room air  Preventive Care Screening     declines colon test for now  CC: followup/RE   Primary Care Provider:  Corwin Levins MD  CC:  followup/RE.  History of Present Illness: quit smoking jan 28, none since then;  no c/o fever, BP usually < 140/90, Pt denies CP, sob, doe, wheezing, orthopnea, pnd, worsening LE edema, palps, dizziness or syncope   Pt denies new neuro symptoms such as headache, facial or extremity weakness  Has some mild reflux it seems to eat later at night and then lie down.  He is unaware of any pain or reason for fever today, such as recent ST, headache, cough, or urinary symtpoms such as dysuria, or rash or diarrhea.  Here for wellness Diet: Heart Healthy or DM if diabetic Physical Activities: Sedentary Depression/mood screen: Negative Hearing: Intact bilateral Visual Acuity: Grossly normal ADL's: Capable  Fall Risk: None Home Safety: Good End-of-Life Planning: Advance directive - Full code/I agree   Preventive Screening-Counseling & Management  Alcohol-Tobacco     Smoking Status: quit      Drug Use:  no.    Problems Prior to Update: 1)  Coronary Artery Disease  (ICD-414.00) 2)  Hyperlipidemia  (ICD-272.2) 3)  Chest Pain  (ICD-786.50) 4)  Hyperlipidemia  (ICD-272.4) 5)  Parkinson's Disease  (ICD-332.0) 6)  Bipolar Disorder Unspecified  (ICD-296.80) 7)  Abnormal Stress Electrocardiogram  (ICD-794.31) 8)  Gerd  (ICD-530.81) 9)  Cellulitis  (ICD-682.9) 10)  Hyperthyroidism  (ICD-242.90) 11)  Special Screening Malig Neoplasms Other Sites  (ICD-V76.49) 12)  Depression  (ICD-311) 13)  Fatigue   (ICD-780.79) 14)  Smoker  (ICD-305.1) 15)  Fatigue  (ICD-780.79) 16)  Family History Diabetes 1st Degree Relative  (ICD-V18.0) 17)  Pilonidal Cyst, With Abscess  (ICD-685.0) 18)  Tardive Dyskinesia  (ICD-333.82) 19)  Schizoaffective Disorder  (ICD-295.70) 20)  Glucose Intolerance, Hx of  (ICD-V12.2)  Medications Prior to Update: 1)  Benztropine Mesylate 1 Mg  Tabs (Benztropine Mesylate) .Marland Kitchen.. 1po Bid 2)  Haloperidol 5 Mg Tabs (Haloperidol) .Marland Kitchen.. 1 Tab By Mouth Once Daily 3)  Fluoxetine Hcl 20 Mg Tabs (Fluoxetine Hcl) .Marland Kitchen.. 1po Once Daily 4)  Depakote 500 Mg Tbec (Divalproex Sodium) .... 2po Qhs 5)  Benadryl 25 Mg Caps (Diphenhydramine Hcl) .... As Needed  Current Medications (verified): 1)  Benztropine Mesylate 1 Mg  Tabs (Benztropine Mesylate) .Marland Kitchen.. 1po Bid 2)  Haloperidol 5 Mg Tabs (Haloperidol) .Marland Kitchen.. 1 Tab By Mouth Once Daily 3)  Fluoxetine Hcl 20 Mg Tabs (Fluoxetine Hcl) .Marland Kitchen.. 1po Once Daily 4)  Depakote 500 Mg Tbec (Divalproex Sodium) .... 2po Qhs 5)  Benadryl 25 Mg Caps (Diphenhydramine Hcl) .... As Needed  Allergies (verified): 1)  ! Sulfa  Past History:  Past Surgical History: Last updated: 08/18/2008 s/p pilonidal cyst x 2  Family History: Last updated: 10/07/2008 Family History Diabetes - father as teen Family History Psychiatric care brother and uncle iwth DM 3 sibs with  cancer - melanoma, colon polyps in sister - not sure what kind no thyroid dz  Social History: Last updated: 10/20/2009 Alcohol use-no Divorced 1 daughter disabled - psych Former Smoker Drug use-no  Risk Factors: Smoking Status: quit (10/20/2009)  Past Medical History: Current Problems:  HYPERLIPIDEMIA (ICD-272.2) CHEST PAIN (ICD-786.50) HYPERLIPIDEMIA (ICD-272.4) PARKINSON'S DISEASE (ICD-332.0) BIPOLAR DISORDER UNSPECIFIED (ICD-296.80) ABNORMAL STRESS ELECTROCARDIOGRAM (ICD-794.31) GERD (ICD-530.81) CELLULITIS (ICD-682.9) HYPERTHYROIDISM (ICD-242.90) SPECIAL SCREENING MALIG  NEOPLASMS OTHER SITES (ICD-V76.49) DEPRESSION (ICD-311) FATIGUE (ICD-780.79) SMOKER (ICD-305.1) FATIGUE (ICD-780.79) FAMILY HISTORY DIABETES 1ST DEGREE RELATIVE (ICD-V18.0) PILONIDAL CYST, WITH ABSCESS (ICD-685.0) TARDIVE DYSKINESIA (ICD-333.82) SCHIZOAFFECTIVE DISORDER (ICD-295.70) GLUCOSE INTOLERANCE, HX OF (ICD-V12.2) tardive dyskinesia Coronary artery disease  Social History: Reviewed history from 09/29/2007 and no changes required. Alcohol use-no Divorced 1 daughter disabled - psych Former Smoker Drug use-no Smoking Status:  quit Drug Use:  no  Review of Systems  The patient denies anorexia, fever, weight loss, weight gain, vision loss, decreased hearing, hoarseness, chest pain, syncope, dyspnea on exertion, peripheral edema, prolonged cough, headaches, hemoptysis, melena, hematochezia, severe indigestion/heartburn, hematuria, incontinence, muscle weakness, transient blindness, difficulty walking, unusual weight change, abnormal bleeding, enlarged lymph nodes, and angioedema.         all otherwise negative per pt -  no dysphagia  Physical Exam  General:  alert and overweight-appearing.   Head:  normocephalic and atraumatic.   Eyes:  vision grossly intact, pupils equal, and pupils round.   Ears:  R ear normal and L ear normal.   Nose:  no external deformity and no nasal discharge.   Mouth:  no gingival abnormalities and pharynx pink and moist.   Neck:  supple and no masses.   Lungs:  normal respiratory effort and normal breath sounds.   Heart:  normal rate and regular rhythm.   Abdomen:  soft, non-tender, and normal bowel sounds.   Msk:  no joint tenderness and no joint swelling.   Extremities:  no edema, no erythema  Neurologic:  cranial nerves II-XII intact and strength normal in all extremities.     Impression & Recommendations:  Problem # 1:  Preventive Health Care (ICD-V70.0)  Overall doing well, age appropriate education and counseling updated and referral  for appropriate preventive services done unless declined, immunizations up to date or declined, diet counseling done if overweight, urged to quit smoking if smokes , most recent labs reviewed and current ordered if appropriate, ecg reviewed or declined (interpretation per ECG scanned in the EMR if done); information regarding Medicare Prevention requirements given if appropriate   Orders: First annual wellness visit with prevention plan  ([id number])  Problem # 2:  HYPERLIPIDEMIA (ICD-272.4)  goal ldl less than 70 - to check lipids, consider statin therapy   Labs Reviewed: SGOT: 16 (03/23/2009)   SGPT: 13 (03/23/2009)   HDL:33.00 (03/23/2009), 43.1 (08/18/2008)  LDL:DEL (08/18/2008), DEL (05/20/2007)  Chol:241 (03/23/2009), 209 (08/18/2008)  Trig:313.0 (03/23/2009), 243 (08/18/2008)  Problem # 3:  FATIGUE (ICD-780.79) exam benign, to check labs below; follow with expectant management  Orders: T-Vitamin D (25-Hydroxy) (60454-09811) TLB-BMP (Basic Metabolic Panel-BMET) (80048-METABOL) TLB-CBC Platelet - w/Differential (85025-CBCD) TLB-Hepatic/Liver Function Pnl (80076-HEPATIC) TLB-TSH (Thyroid Stimulating Hormone) (84443-TSH) TLB-Sedimentation Rate (ESR) (85652-ESR) TLB-IBC Pnl (Iron/FE;Transferrin) (83550-IBC) TLB-B12 + Folate Pnl (82746_82607-B12/FOL) TLB-Udip ONLY (81003-UDIP)  Problem # 4:  GLUCOSE INTOLERANCE, HX OF (ICD-V12.2) asympt - to check a1c  Problem # 5:  GERD (ICD-530.81) mild, to avoid eating later at night, tums prn  Complete Medication List: 1)  Benztropine Mesylate 1 Mg Tabs (Benztropine mesylate) .Marland KitchenMarland KitchenMarland Kitchen  1po bid 2)  Haloperidol 5 Mg Tabs (Haloperidol) .Marland Kitchen.. 1 tab by mouth once daily 3)  Fluoxetine Hcl 20 Mg Tabs (Fluoxetine hcl) .Marland Kitchen.. 1po once daily 4)  Depakote 500 Mg Tbec (Divalproex sodium) .... 2po qhs 5)  Diphenhydramine Hcl 50 Mg Caps (Diphenhydramine hcl) 6)  Simvastatin 40 Mg Tabs (Simvastatin) .Marland Kitchen.. 1 by mouth once daily  Other Orders: Tdap => 47yrs IM  (73710) Admin 1st Vaccine (62694) TLB-Lipid Panel (80061-LIPID) TLB-PSA (Prostate Specific Antigen) (84153-PSA)  Patient Instructions: 1)  you had the tetanus shot today 2)  Please go to the Lab in the basement for your blood and/or urine tests today  3)  Please schedule a follow-up appointment in 1 year or sooner if needed   Immunizations Administered:  Tetanus Vaccine:    Vaccine Type: Tdap    Site: right deltoid    Mfr: GlaxoSmithKline    Dose: 0.5 ml    Route: IM    Given by: Robin Ewing    Exp. Date: 06/08/2010    Lot #: [id number]    VIS given: 07/01/07 version given October 20, 2009.

## 2010-09-14 NOTE — Assessment & Plan Note (Signed)
Summary: OV---BACK/BUMP  STC   Vital Signs:  Patient profile:   55 year old male Height:      68.5 inches Weight:      178.13 pounds BMI:     26.79 O2 Sat:      98 % on Room air Temp:     98.9 degrees F oral Pulse rate:   79 / minute BP sitting:   132 / 90  (left arm) Cuff size:   regular  Vitals Entered By: Zella Ball Ewing CMA Duncan Dull) (August 22, 2010 2:29 PM)  O2 Flow:  Room air  CC: Moles on Back/RE   Primary Care Provider:  Corwin Levins MD  CC:  Moles on Back/RE.  History of Present Illness: here for f/u; has several skin lesions to the back with which he is concerned, overall o/w doing ok, does have significant fatigue, but denies OSA symtpoms and Denies worsening depressive symptoms, suicidal ideation, or panic.   Pt denies CP, worsening sob, doe, wheezing, orthopnea, pnd, worsening LE edema, palps, dizziness or syncope  Pt denies new neuro symptoms such as headache, facial or extremity weakness  Pt denies polydipsia, polyuria  Overall good compliance with meds, trying to follow low chol  diet, wt stable, little excercise however .  No fever, wt loss, night sweats, loss of appetite or other constitutional symptoms  Overall good compliance with meds, and good tolerability. Pt states good ability with ADL's, low fall risk, home safety reviewed and adequate, no significant change in hearing or vision, trying to follow lower chol diet, and occasionally active only with regular excercise.  Stills sees psychiatry and needs lab results faxed there.  Denies reflux, dysphagia, n/v, abd pain, bowel change or blood.  Has occasional cough, nonprod, no hemoptysis.  Problems Prior to Update: 1)  COPD  (ICD-496) 2)  Need Prophylactic Vaccination&inoculation Flu  (ICD-V04.81) 3)  Cough  (ICD-786.2) 4)  Fatigue  (ICD-780.79) 5)  Glucose Intolerance  (ICD-271.3) 6)  Skin Lesion  (ICD-709.9) 7)  Hepatotoxicity, Drug-induced, Risk of  (ICD-V58.69) 8)  Preventive Health Care  (ICD-V70.0) 9)   Coronary Artery Disease  (ICD-414.00) 10)  Hyperlipidemia  (ICD-272.2) 11)  Chest Pain  (ICD-786.50) 12)  Parkinson's Disease  (ICD-332.0) 13)  Bipolar Disorder Unspecified  (ICD-296.80) 14)  Abnormal Stress Electrocardiogram  (ICD-794.31) 15)  Gerd  (ICD-530.81) 16)  Cellulitis  (ICD-682.9) 17)  Hyperthyroidism  (ICD-242.90) 18)  Special Screening Malig Neoplasms Other Sites  (ICD-V76.49) 19)  Depression  (ICD-311) 20)  Fatigue  (ICD-780.79) 21)  Smoker  (ICD-305.1) 22)  Fatigue  (ICD-780.79) 23)  Family History Diabetes 1st Degree Relative  (ICD-V18.0) 24)  Pilonidal Cyst, With Abscess  (ICD-685.0) 25)  Tardive Dyskinesia  (ICD-333.82) 26)  Schizoaffective Disorder  (ICD-295.70) 27)  Glucose Intolerance, Hx of  (ICD-V12.2)  Medications Prior to Update: 1)  Benztropine Mesylate 1 Mg  Tabs (Benztropine Mesylate) .Marland Kitchen.. 1po Bid 2)  Haloperidol 5 Mg Tabs (Haloperidol) .Marland Kitchen.. 1 Tab By Mouth Once Daily 3)  Fluoxetine Hcl 20 Mg Tabs (Fluoxetine Hcl) .Marland Kitchen.. 1po Once Daily 4)  Depakote 500 Mg Tbec (Divalproex Sodium) .... 2po Qhs 5)  Diphenhydramine Hcl 50 Mg Caps (Diphenhydramine Hcl) 6)  Simvastatin 40 Mg Tabs (Simvastatin) .Marland Kitchen.. 1 By Mouth Once Daily 7)  Zantac 300 Mg Tabs (Ranitidine Hcl) .Marland Kitchen.. 1po At Bedtime  Current Medications (verified): 1)  Benztropine Mesylate 1 Mg  Tabs (Benztropine Mesylate) .Marland Kitchen.. 1po Bid 2)  Haloperidol 5 Mg Tabs (Haloperidol) .Marland Kitchen.. 1 Tab By Mouth Once  Daily 3)  Fluoxetine Hcl 20 Mg Tabs (Fluoxetine Hcl) .Marland Kitchen.. 1po Once Daily 4)  Depakote 500 Mg Tbec (Divalproex Sodium) .... 2po Qhs 5)  Diphenhydramine Hcl 50 Mg Caps (Diphenhydramine Hcl) 6)  Simvastatin 40 Mg Tabs (Simvastatin) .Marland Kitchen.. 1 By Mouth Once Daily 7)  Zantac 300 Mg Tabs (Ranitidine Hcl) .Marland Kitchen.. 1po At Bedtime  Allergies (verified): 1)  ! Sulfa  Past History:  Past Surgical History: Last updated: 08/18/2008 s/p pilonidal cyst x 2  Family History: Last updated: 10/07/2008 Family History Diabetes - father  as teen Family History Psychiatric care brother and uncle iwth DM 3 sibs with cancer - melanoma, colon polyps in sister - not sure what kind no thyroid dz  Social History: Last updated: 08/22/2010 Alcohol use-no Divorced 1 daughter disabled - psych Former Smoker Drug use-no lives with mother - Santiel Topper (also pt here)  Risk Factors: Smoking Status: quit (10/20/2009)  Past Medical History: Current Problems:  HYPERLIPIDEMIA (ICD-272.2) CHEST PAIN (ICD-786.50) HYPERLIPIDEMIA (ICD-272.4) PARKINSON'S DISEASE (ICD-332.0) BIPOLAR DISORDER UNSPECIFIED (ICD-296.80) ABNORMAL STRESS ELECTROCARDIOGRAM (ICD-794.31) GERD (ICD-530.81) CELLULITIS (ICD-682.9) HYPERTHYROIDISM (ICD-242.90) SPECIAL SCREENING MALIG NEOPLASMS OTHER SITES (ICD-V76.49) DEPRESSION (ICD-311) FATIGUE (ICD-780.79) SMOKER (ICD-305.1) FATIGUE (ICD-780.79) FAMILY HISTORY DIABETES 1ST DEGREE RELATIVE (ICD-V18.0) PILONIDAL CYST, WITH ABSCESS (ICD-685.0) TARDIVE DYSKINESIA (ICD-333.82) SCHIZOAFFECTIVE DISORDER (ICD-295.70) GLUCOSE INTOLERANCE, HX OF (ICD-V12.2) tardive dyskinesia Coronary artery disease glucose intolerance COPD  Social History: Reviewed history from 10/20/2009 and no changes required. Alcohol use-no Divorced 1 daughter disabled - psych Former Smoker Drug use-no lives with mother - Reino Lybbert (also pt here)  Review of Systems       all otherwise negative per pt -    Physical Exam  General:  alert and overweight-appearing.   Head:  normocephalic and atraumatic.   Eyes:  vision grossly intact, pupils equal, and pupils round.   Ears:  R ear normal and L ear normal.   Nose:  no external deformity and no nasal discharge.   Mouth:  no gingival abnormalities and pharynx pink and moist.   Neck:  supple and no masses.   Lungs:  normal respiratory effort and normal breath sounds.   Heart:  normal rate and regular rhythm.   Abdomen:  soft, non-tender, and normal bowel sounds.     Msk:  no joint tenderness and no joint swelling.   Extremities:  no edema, no erythema  Neurologic:  cranial nerves II-XII intact and strength normal in all extremities.   Skin:  color normal and no rashes.  , but has several seb keratosis to the lower back, not suspicious lesions Psych:  not depressed appearing and slightly anxious.     Impression & Recommendations:  Problem # 1:  SKIN LESION (ICD-709.9) with mult seb keratosis to back - benign appearing, no further eval or tx needed, pt reassured  Problem # 2:  HYPERLIPIDEMIA (ICD-272.2)  His updated medication list for this problem includes:    Simvastatin 40 Mg Tabs (Simvastatin) .Marland Kitchen... 1 by mouth once daily  Orders: TLB-Lipid Panel (80061-LIPID)  Labs Reviewed: SGOT: 16 (11/17/2009)   SGPT: 16 (11/17/2009)   HDL:45.20 (11/17/2009), 47.30 (10/20/2009)  LDL:DEL (08/18/2008), DEL (05/20/2007)  Chol:253 (11/17/2009), 344 (10/20/2009)  Trig:351.0 (11/17/2009), 570.0 (10/20/2009) stable overall by hx and exam, ok to continue meds/tx as is - Pt to continue diet efforts, good med tolerance; to check labs - goal LDL less than  70  Problem # 3:  GERD (ICD-530.81)  His updated medication list for this problem includes:    Zantac 300 Mg  Tabs (Ranitidine hcl) .Marland Kitchen... 1po at bedtime  Labs Reviewed: Hgb: 13.4 (10/20/2009)   Hct: 40.3 (10/20/2009) stable overall by hx and exam, ok to continue meds/tx as is   Problem # 4:  GLUCOSE INTOLERANCE (ICD-271.3) asympt - to check a1c with goal < 7.0 Orders: TLB-A1C / Hgb A1C (Glycohemoglobin) (83036-A1C)  Problem # 5:  FATIGUE (ICD-780.79) exam benign, to check labs below; follow with expectant management  Orders: TLB-BMP (Basic Metabolic Panel-BMET) (80048-METABOL) TLB-CBC Platelet - w/Differential (85025-CBCD) TLB-Hepatic/Liver Function Pnl (80076-HEPATIC) TLB-TSH (Thyroid Stimulating Hormone) (84443-TSH) TLB-Udip ONLY (81003-UDIP)  Problem # 6:  COUGH (ICD-786.2)  suspect related to  smoker cough since he started back smoking, pt requests cxr -   Orders: T-2 View CXR, Same Day (71020.5TC)  Problem # 7:  BIPOLAR DISORDER UNSPECIFIED (ICD-296.80) stable overall by hx and exam, ok to continue meds/tx as is  Orders: T-Valproic Acid (Depakene) (16109-60454)  Complete Medication List: 1)  Benztropine Mesylate 1 Mg Tabs (Benztropine mesylate) .Marland Kitchen.. 1po bid 2)  Haloperidol 5 Mg Tabs (Haloperidol) .Marland Kitchen.. 1 tab by mouth once daily 3)  Fluoxetine Hcl 20 Mg Tabs (Fluoxetine hcl) .Marland Kitchen.. 1po once daily 4)  Depakote 500 Mg Tbec (Divalproex sodium) .... 2po qhs 5)  Diphenhydramine Hcl 50 Mg Caps (Diphenhydramine hcl) 6)  Simvastatin 40 Mg Tabs (Simvastatin) .Marland Kitchen.. 1 by mouth once daily 7)  Zantac 300 Mg Tabs (Ranitidine hcl) .Marland Kitchen.. 1po at bedtime  Other Orders: Flu Vaccine 65yrs + MEDICARE PATIENTS (U9811) Administration Flu vaccine - MCR (G0008) TLB-PSA (Prostate Specific Antigen) (84153-PSA)  Patient Instructions: 1)  Continue all previous medications as before this visit 2)  Please go to Radiology in the basement level for your X-Ray today  3)  Please go to the Lab in the basement for your blood and/or urine tests today 4)  Please call the number on the [facility] Card for results of your testing  5)  You had the flu shot today 6)  Please schedule a follow-up appointment in 6 months.   Orders Added: 1)  T-Valproic Acid (Depakene) [80164-23520] 2)  Flu Vaccine 18yrs + MEDICARE PATIENTS [Q2039] 3)  Administration Flu vaccine - MCR [G0008] 4)  TLB-BMP (Basic Metabolic Panel-BMET) [80048-METABOL] 5)  TLB-CBC Platelet - w/Differential [85025-CBCD] 6)  TLB-Hepatic/Liver Function Pnl [80076-HEPATIC] 7)  TLB-TSH (Thyroid Stimulating Hormone) [84443-TSH] 8)  TLB-A1C / Hgb A1C (Glycohemoglobin) [83036-A1C] 9)  TLB-PSA (Prostate Specific Antigen) [84153-PSA] 10)  TLB-Lipid Panel [80061-LIPID] 11)  TLB-Udip ONLY [81003-UDIP] 12)  T-2 View CXR, Same Day [71020.5TC] 13)  Est. Patient Level IV  [91478]   Immunizations Administered:  Influenza Vaccine # 1:    Vaccine Type: Fluvax 6-43mos    Site: left deltoid    Mfr: Sanofi Pasteur    Dose: 0.5 ml    Route: IM    Given by: Zella Ball Ewing CMA (AAMA)    Exp. Date: 02/10/2011    Lot #: [id number]    VIS given: 03/07/10 version given August 22, 2010.   Immunizations Administered:  Influenza Vaccine # 1:    Vaccine Type: Fluvax 6-44mos    Site: left deltoid    Mfr: Sanofi Pasteur    Dose: 0.5 ml    Route: IM    Given by: Zella Ball Ewing CMA (AAMA)    Exp. Date: 02/10/2011    Lot #: [id number]    VIS given: 03/07/10 version given August 22, 2010.

## 2010-09-14 NOTE — Letter (Signed)
Summary: Referral - not able to see patient  [facility]  [address] [place], [place] [address]   Phone: [phone]  Fax: [phone]    August 24, 2010   Oliver Barre, MD [address] [place], [place] [address]   Re:   MANAS HICKLING DOB:  03-01-1956 MRN:   [id number]    Dear Dr. Jonny Ruiz:  Thank you for your kind referral of the above patient.  We have attempted to schedule the recommended procedure Screening Colonoscopy but have not been able to schedule because:  ___ The patient was not available by phone and/or has not returned our calls.  X   The patient declined to schedule the procedure at this time.  We appreciate the referral and hope that we will have the opportunity to treat this patient in the future.    Sincerely,    [facility] [phone]

## 2010-09-14 NOTE — Miscellaneous (Signed)
Summary: Orders Update   Clinical Lists Changes  Orders: Added new Referral order of Gastroenterology Referral (GI) - Signed 

## 2010-12-26 NOTE — H&P (Signed)
NAME:  Ruben Blackwell, Ruben Blackwell NO.:  [id number]   MEDICAL RECORD NO.:  [id number]          PATIENT TYPE:  IPS   LOCATION:  0403                          FACILITY:  [facility]   PHYSICIAN:  Anselm Jungling, MD  DATE OF BIRTH:  1956-07-05   DATE OF ADMISSION:  03/20/2008  DATE OF DISCHARGE:                       PSYCHIATRIC ADMISSION ASSESSMENT   This is a voluntary admission to the services of Dr. Geralyn Flash.  This is a 55 year old divorced white male.  He presented to the  [facility] yesterday.  He was brought here by the [facility] sheriff's department.  They reported that he had disorganized  thinking, bizarre behavior and medication noncompliance.  The patient  has a long history for schizophrenia.  He is currently outpatient with  Dr. Donell Beers.  He reports he has been off his medications for at least 2-  3 weeks.  He has been experiencing confusion, disorganized thinking,  decreased sleep and decreased appetite and he has been exhibiting  bizarre behaviors such as showing up naked at his neighbor's house  offering to weed-eat her lawn.  He has also been leaving the gas stove  on and unattended.  He was cooperative and willing to sign into the  hospital.   PAST PSYCHIATRIC HISTORY:  As already stated, he is currently under the  care of Dr. Archer Asa.  He was last here April 30 to Dec 17, 2005.  He reports having been at [facility] in July of 2009 and [facility].   SOCIAL HISTORY:  He went to the fourth grade.  He has been married and  divorced once.  He has one daughter, age 40.  He gets SSDI.  He lives  alone with a cat.   FAMILY HISTORY:  He denies.   ALCOHOL AND DRUG HISTORY:  He reports smoking.   MEDICAL HISTORY AND PRIMARY CARE Jenise Iannelli:  He is not known to have any  medical illnesses.  He denies having a PCP.  His psychiatrist is Dr.  Donell Beers.   MEDICAL PROBLEMS:  He reports that he needs dental care.   MEDICATIONS:  He  states that he is currently prescribed lithium 300 mg  in the morning, 600 mg at bedtime.  However, because he does not have a  watch he does not remember to take his medicines.   DRUG ALLERGIES:  He says SULFA gives him a rash.   POSITIVE PHYSICAL FINDINGS:  He appears his stated age of 55.  He is a  little light for his height.  He denies any current issues.  He states  that he  might need hernia repair and he has an old hernia by his naval.  His vital signs on admission show he is 68 inches tall, he weighs 158.  Temperature is 97.9, blood pressure was 137/79, pulse was 99,  respirations are 18.  His labs are pending, I do not actually have those  yet.   MENTAL STATUS EXAM:  He could be alert and oriented although he  frequently lapsed into responding to internal stimuli.  His appearance,  he is  casually groomed and dressed.  He appears to be undernourished.  He reports issues with his teeth and would like dental care.  His speech  could be clear although he did just go off and become delusional.  His  mood is anxiously depressed.  His affect had a wide range.  Thought  processes were not clear, not rational.  They were goal oriented.  He  realizes that he needs to get medicine.  Judgment and insight are fair.  Concentration and memory are poor.  Intelligence is average to below.  He is responding to auditory hallucinations.  He denies being suicidal  or homicidal.   DIAGNOSES:  AXIS I:  Schizoaffective disorder, exacerbation due to  noncompliance.  AXIS II:  Deferred.  AXIS III:  None known.  He does have an umbilical hernia and he is  reporting dental issues.  AXIS IV:  Severe, chronic illness.  AXIS V:  20.   PLAN:  Admit for safety and stabilization.  We will adjust his  medications.  His mood is labile.  He is responding to internal stimuli  and toward that end we will increase his Seroquel at bedtime to 300 mg.  He is taking Cogentin 1 mg b.i.d., lithium carbonate 300 mg  b.i.d. and  Seroquel 100 mg p.o. t.i.d. p.r.n. agitation and psychosis.  We will  have the case manager to look into dental care after discharge and he  may need an assisted living facility to ensure medication compliance.  Estimated length of stay is 5 days.      Mickie Leonarda Salon, P.A.-C.      Anselm Jungling, MD  Electronically Signed    MD/MEDQ  D:  03/21/2008  T:  03/21/2008  Job:  [phone]

## 2010-12-26 NOTE — Discharge Summary (Signed)
NAME:  SOMA, LIZAK NO.:  [id number]   MEDICAL RECORD NO.:  [id number]          PATIENT TYPE:  IPS   LOCATION:  0404                          FACILITY:  [facility]   PHYSICIAN:  Anselm Jungling, MD  DATE OF BIRTH:  [birth year]   DATE OF ADMISSION:  03/20/2008  DATE OF DISCHARGE:  04/05/2008                               DISCHARGE SUMMARY   IDENTIFYING DATA/REASON FOR ADMISSION:  This was a voluntary admission  for Camara, a 55 year old divorced white male, who has had numerous prior  admissions to our psychiatric service.  He was brought by the sheriff's  department with disorganized thinking and bizarre behavior.  He had been  seeing Dr. Donell Beers, a psychiatrist on an outpatient basis, but had been  noncompliant with his medications for 2-3 weeks.  Please refer to the  admission note for further details pertaining to the symptoms,  circumstances and history that led to his hospitalization.  He was given  an initial Axis I diagnosis of schizoaffective disorder, with acute  exacerbation secondary to medication noncompliance.   MEDICAL/LABORATORY:  The patient is in generally good health without any  active or chronic medical problems.  There were no significant medical  issues.   HOSPITAL COURSE:  The patient was admitted the adult inpatient  psychiatric service.  He presented as a well-nourished, normally-  developed adult male whose thoughts and speech were disorganized.  His  thinking is bizarre and eccentric, and has continued throughout his  stay.  His level of insight was relatively intact in that he understood  that he was being treated for mental disorder and initially was not  opposed to this.  However, because he could not smoke while a patient  here, he became increasingly dissatisfied with his stay.  He minimized  his symptoms, and did not seem to be able to fully appreciate them.  However, he was, for the most part, compliant with medications, and his  treatment with medication consisted of a regimen of Seroquel and  lithium.  Lithium levels were found to be low and increased up wise to  an ultimate level of 900 mg b.i.d.  Seroquel was ultimately increased to  a level of 100 mg b.i.d. and 200 mg q.h.s.  Cogentin was given for side  effects.  His medications were generally well tolerated.   During the middle portion of his stay, he did require one-to-one  observation for period of several days due to his impulsive behavior,  and his lack of boundaries.  On one occasion, he was found showering in  the shower stall of the bathroom of a male patient down the hall from  him.  However, as medication became more effective for him.  He was able  to behave in a relatively more conventional fashion, although he  continued quite eccentric.   Towards the end of his stay, it was not clear whether he had yet reached  his baseline level of functioning, or the best that could be expected,  however, he was insisting on discharge, and given that he was a  voluntary patient, there were no  criteria for holding him involuntarily.   The case manager had been in touch with the patient's adult daughter  prior to discharge.  The patient was discharged to his home.  He was  pleased about his discharge and agreed to the following aftercare plan.   AFTERCARE:  The patient was to follow-up for medication management, most  likely at [facility].   DISCHARGE MEDICATIONS:  1. Seroquel 100 mg b.i.d. and 200 mg q.h.s.  2. Lithium carbonate 900 mg b.i.d.  3. Cogentin 1 mg b.i.d..   DISCHARGE DIAGNOSES:  AXIS I: Schizophrenia, chronic paranoid type,  acute exacerbation, resolving.  AXIS II:  Deferred.  AXIS III:  No acute or chronic illnesses.  AXIS IV:  Stressors severe.  AXIS V:  GAF on discharge 50.      Anselm Jungling, MD  Electronically Signed     SPB/MEDQ  D:  04/05/2008  T:  04/05/2008  Job:  [id number]

## 2010-12-29 NOTE — H&P (Signed)
NAME:  Ruben Blackwell, Ruben Blackwell NO.:  [id number]   MEDICAL RECORD NO.:  [id number]          PATIENT TYPE:  INP   LOCATION:  5504                         FACILITY:  [facility]   PHYSICIAN:  Corwin Levins, M.D. LHCDATE OF BIRTH:  1956/02/18   DATE OF ADMISSION:  06/12/2005  DATE OF DISCHARGE:  06/12/2005                                HISTORY & PHYSICAL   CHIEF COMPLAINT:  Nausea, vomiting, diarrhea, fever noted at mental health  inpatient service; questionable nonproductive cough, though he is a smoker.   HISTORY OF PRESENT ILLNESS:  Ruben Blackwell is a 55 year old white male  admitted involuntarily with psychotic behavior June 10, 2005, after  clearing medically in the ER October 29. Labs were checked, apparently  negative at that time, although there was some questionable right lobe  opacity, although this is overall unclear. There was recommendation for  follow-up, not done so far. Unfortunately, October 30 he developed  increasing __________, fever, questionable increased cough per the sitter  that is with him today, although, again, this is unclear as he smokes three  packs per day. More obvious is he had nausea and several episodes of loose  stool. This is of questionable clinical significance. He also has multiple  p.r.n.'s for bowels including Dulcolax and Milk of Magnesia. It is unclear  how much of this he received.   PAST MEDICAL HISTORY:  Illnesses:  1.  Schizoaffective disorder.  2.  Tardive dyskinesia.  3.  Questionable Parkinson's, although it is unclear how this diagnosis was      added to his record.   Surgeries:  History of pilonidal cyst.   ALLERGIES:  No known drug allergies.   MEDICATIONS:  October 29 prior to his psychiatric admission:  1.  Lithium 300 mg q.h.s.  2.  Diazepam 5 mg t.i.d.  3.  Benadryl 50 mg q.h.s.  4.  Risperdal 1 mg q.6h. then 4 mg q.12h.  5.  Protonix 40 mg p.o. daily.  6.  Guaifenesin 600 mg b.i.d.  7.  Tylenol p.r.n.   Current medications are the same, except Risperdal is 2 mg b.i.d. Also added  is:  1.  Cogentin 1 mg q.4h. p.r.n.  2.  Risperdal 1 mg q.6h. p.r.n.  3.  Nicotine patch as directed.  4.  Phenergan 25 mg p.r.n.  5.  Dulcolax, Imodium, Maalox, and Milk of Magnesia on a p.r.n. basis.   SOCIAL HISTORY:  Tobacco:  One to three packs per day. Alcohol:  None.   FAMILY HISTORY:  Mother with bipolar, others with diabetes.   REVIEW OF SYSTEMS:  Otherwise noncontributory.   PHYSICAL EXAMINATION:  VITAL SIGNS:  Temperature 103.5, blood pressure  125/66, heart rate 107, respirations 20. O2 saturation 92% on room air, 100%  on 2 L.  ENT:  Sclerae clear, TMs clear, pharynx benign.  NECK:  Without lymphadenopathy, JVD, or thyromegaly.  CHEST:  Somewhat decreased breath sounds right chest with expiratory wheeze;  otherwise, clear to auscultation.  CARDIAC:  Regular rate and rhythm.  ABDOMEN:  Soft, nontender, positive bowel sounds.  EXTREMITIES:  No edema.   LABORATORY DATA:  October 29, acute abdominal series:  Questionable opacity  right mid to lower lung, though this is unclear if definite. October 30, UA  negative. October 31, lithium less than 0.25. CK 214. White blood cell count  6.5, hemoglobin 12.7. Electrolytes with potassium 3.2, BUN 7, creatinine  1.4, glucose 125. LFTs within normal limits. October 11, TSH normal.   ASSESSMENT AND PLAN:  1.  Febrile illness, most likely infectious, probable viral acute      gastroenteritis versus bacterial or bacterial pneumonitis. He is to be      admitted. Check blood, urine, sputum cultures if possible. Other routine      labs as well as repeat acute abdominal series. He is to given empiric IV      fluids and Avelox as well as Imodium on a p.r.n. basis and check a C.      difficile toxin.  2.  Hypokalemia. Replace IV with fluids.  3.  Psychiatric. Continue medications.           ______________________________  Corwin Levins, M.D. [facility]      JWJ/MEDQ  D:  06/12/2005  T:  06/12/2005  Job:  [id number]   cc:   Geoffery Lyons, M.D.   Archer Asa, M.D.

## 2010-12-29 NOTE — Discharge Summary (Signed)
NAME:  Ruben Blackwell, Ruben Blackwell             ACCOUNT NO.:  [id number]   MEDICAL RECORD NO.:  [id number]          PATIENT TYPE:  INP   LOCATION:  1426                         FACILITY:  [facility]   PHYSICIAN:  Rene Paci, M.D. LHCDATE OF BIRTH:  01-29-1956   DATE OF ADMISSION:  06/11/2005  DATE OF DISCHARGE:                                 DISCHARGE SUMMARY   DISCHARGE DIAGNOSES:  1.  Fever with acute nausea, vomiting and diarrhea secondary to infectious      gastroenteritis. No other etiologies identified. Status post 3 days      empiric Avelox. Symptoms resolved.  2.  Acute psychosis with history of schizoaffective disorder, for return to      [facility] inpatient admission status post inpatient reevaluation      by Dr. Jeanie Sewer.  3.  Mild anemia, hemodynamically stable. No sign or symptoms of bleeding.      Discharge hemoglobin 11.5.  4.  Mild hypernatremia, hyperchloridemia, secondary hypertonic solution,      resolved; see laboratory below.  5.  Mild hypokalemia secondary to gastrointestinal loss status post      replacement and resolved; see laboratory below.   DISCHARGE MEDICATIONS:  Are as prior to admission/transfer and are as  follows:  1.  Lithium 300 mg p.o. q.h.s.  2.  Valium 5 mg p.o. t.i.d.  3.  Benadryl 50 mg q.h.s.  4.  Risperdal 2 mg p.o. b.i.d.  5.  Protonix 40 mg daily.  6.  Guaifenesin 600 mg b.i.d.   DISPOSITION:  The patient is afebrile greater than 24 hours with no symptoms  of nausea, vomiting or diarrhea since transfer from [facility],  tolerating p.o. Still requiring inpatient psych care due to potentially  lethal harm to self though neglect due to acute psychosis per psychiatry.  Stable for transfer to [facility].   HOSPITAL COURSE BY PROBLEM:  FEVER WITH NAUSEA, VOMITING. The patient is a  55 year old gentleman who was transferred to [facility] emergency room from  [facility] where he was being involuntarily kept for the  second time  this month due to acute psychosis and neglect of self. While he was at  [facility] he developed a suddenly high fever of 102 with nausea and  vomiting. Because of this, he was transferred to [facility] for further  evaluation of etiology of fever. He was admitted to rule out pneumonia or  other infectious causes, though his symptoms and time course were most  consistent with infectious gastroenteritis. He was begun empirically on IV  Avelox and monitored carefully by nursing and medical staff where he had no  further GI symptoms. He had a low-grade temperature in the 99-100 range for  the first 24 hours and has been afebrile since. He has had no recurrence of  his nausea, vomiting or diarrhea, no complaints of pain. He was seen in  consultation the day prior to transfer by Dr. Jeanie Sewer of psychiatry who  agreed that inpatient psych care was still necessary for this patient and  thus this patient is medically stable, will arrange for transfer back to  [facility] when bed is available, hopefully today.   All other medical issues were stable. No changes are made in his psychiatric  or medical regimen and are as prior to transfer.   PHYSICAL EXAMINATION AT TIME OF TRANSFER:  VITAL SIGNS:  Temperature 97.7,  blood pressure 133/88, pulse is 68, respirations 27, 98% on room air.  GENERAL:  He is awake and interactive but inappropriate conversation,  tolerating a regular diet and p.o. medications.  LUNGS:  Clear to auscultation bilaterally.  CARDIOVASCULAR:  Regular rate and rhythm.  ABDOMEN:  Soft, nontender, nondistended, and good bowel sounds throughout.  EXTREMITIES:  Show no edema.  NEUROLOGIC:  He is awake, alert, and appears oriented to self though  ramblings and mutterings, as well as flight of ideas make it difficult for  the patient to answer other questions regarding mental status. There no  gross motor deficits.   LABORATORY DATA ON DAY OF  DISCHARGE:  White count 4.6, hemoglobin 11.5,  platelet count 254. Basic metabolic panel is normal and read as follows:  Sodium is 142, potassium of 3.6, chloride 107, bicarb 29, glucose 94, BUN of  8, creatinine 1.4, calcium of 9.1. Respiratory culture still pending but  likely be normal flora. C. difficile October 31 negative x1. Blood culture  x4 from October 31:  No growth to date. Lithium level at time of admission  October 31 was less than 0.25. Urinalysis negative.      Rene Paci, M.D. [facility]  Electronically Signed     VL/MEDQ  D:  06/14/2005  T:  06/14/2005  Job:  [id number]

## 2010-12-29 NOTE — Discharge Summary (Signed)
NAME:  Ruben Blackwell, WHITENACK NO.:  [id number]   MEDICAL RECORD NO.:  [id number]          PATIENT TYPE:  IPS   LOCATION:  0401                          FACILITY:  [facility]   PHYSICIAN:  Jasmine Pang, M.D. DATE OF BIRTH:  [birth year]   DATE OF ADMISSION:  01/10/2006  DATE OF DISCHARGE:  01/16/2006                                 DISCHARGE SUMMARY   IDENTIFYING INFORMATION:  This is a 55 year old Caucasian male who is  single.  This is a voluntary admission.   HISTORY OF PRESENT ILLNESS:  Patient is a 55 year old Caucasian male with a  history of schizoaffective disorder.  He has been living with his mother  since he was discharged a week ago from our unit at [facility].  He got upset with her and said he could not stand living  there any longer.  She was cleaning up and messing in his things.  This  upset him, and he took an overdose of several medications, at least 50  tablets of Valium 5 mg.  He states he possibly took some propranolol, and he  is not sure if he took any lithium.  The patient was seen in the emergency  room.  He reports that since he went to live with his mother, he is not  eating or drinking regularly.  It is not clear if he has been taking his  medications regularly.  He does not want to go back to his mother's home.  His smoking has increased markedly.  He has been having suicidal thoughts,  and his mood is much worse.  He states he feels, I am at the bottom of the  barrel with my depression.  He endorses suicidal thoughts.  Denies any  homicidal thoughts.  Denies any auditory or visual hallucinations.   ADMISSION DIAGNOSIS:  Schizoaffective disorder.  Rule out bipolar type,  depressed.   ADMISSION MENTAL STATUS EXAMINATION:  The patient appears mildly sedated.  He is up and about doing his ADLs and has been in the shower.  His reaction  time is slowed.  His speech is slightly slurred.  He has persistent mild  tremor at  rest, pill-rolling tremor of both hands and arms.  He has a  shuffling gait.  He does not readily initiate a lot of speech.  However,  once he is asked questions about his home situation, he volunteers all his  frustrations about his mother.  At that point, he becomes fluent and  articulate.  There are no signs of internal distraction.  He states he is  not sure when he wants to do with himself.  He fears going to assisted  living facility because he thinks that he cannot smoke there.  He does not  want to return to his mother's home.  He has the option of going to his own  home which is still intact.  He denies any suicidal ideation today though  did have some on admission.  He readily contracted for safety.  There were  no homicidal thoughts.  His mood has been depressed.  Cognitively, he is  intact and oriented times three.   PAST PSYCHIATRIC HISTORY:  This is one of several admissions for this  patient since January of this year, and he is well known to [place].  He has a  history of schizoaffective disorder.  He is followed as an outpatient by Dr.  Archer Asa.  He had been on lithium for many years in the past.  He has  generally been compliant with his medications.  He has had a history of  prior suicide attempts.  He currently is on lithium carbonate 300 mg p.o.  q.a.m. and 600 mg p.o. q.h.s.  He is also on propranolol 20 mg b.i.d. to  manage his tremor.  He is on Artane 2 mg p.o. b.i.d.  Valium 5 mg p.o.  b.i.d. and Haldol 5 mg p.o. t.i.d.   SOCIAL HISTORY:  The patient graduated from high school in 1977.  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  He  has been divorced for many years.  He has a 53 year old daughter who is in  college majoring in psychology whom he stays in touch with.   FAMILY HISTORY:  Remarkable for some substance abuse in the extended family,  and he has a cousin that has previously attempted suicide.   SUBSTANCE USE  HISTORY:  The patient does abuse approximately three packs of  cigarettes per day.  He also has a history of overusing his Valium from time  to time.   MEDICAL HISTORY:  The patient's primary care physician is to Dr. Tresa Endo, whom he has not seen in approximately a year.   MEDICAL PROBLEMS:  Include tobacco abuse, bronchitis not otherwise  specified, and pill-rolling tremor, rule out dyskinesia secondary to  antipsychotic.   CURRENT MEDICATIONS:  1.  Valium 5 mg p.o. b.i.d.  2.  Haldol 5 mg p.o. t.i.d.  3.  Lithium carbonate 300 mg in the morning and 600 mg at night.  4.  He is also on propranolol, started on his last admission, 20 mg p.o.      b.i.d. to help manage the tremor that he is having.  5.  He is also on Artane 2 mg p.o. b.i.d.  6.  He is used Ambien 10 mg p.o. q.h.s. in the past but does not use this at      home.   ALLERGIES:  He is allergic to SULFA.   PHYSICAL EXAMINATION:  The patient's full physical exam was done in the  emergency room.  It is noted in the record.  There were no acute medical  problems on admission to the unit.  Vital signs were within normal limits.   ADMISSION LABORATORY:  CBC was within normal limits except for a mildly  elevated WBC count of 10.6.  Chem-7 panel on Jan 10, 2006 revealed a  decreased sodium of 134 and an elevated glucose of 117.  On January 13, 2006, a  chem panel revealed a sodium of 142 and increased glucose of 112.  Otherwise, it was within normal limits.  Hepatic profile was within normal  limits.  Albumin 4.  Calcium 9.2 upon admission; repeat on February 09, 2006, it  was 10.  Lithium level upon admission was 0.79 and (0.8-1.4).  Lithium level  on January 13, 2006 was 0.78 (0.8-1.4).  Acetaminophen level was less than 10.  Alcohol level less than 5.  Drug screen was negative.   HOSPITAL COURSE:  Upon  admission, the patient was not continued on any medication due to his overdose.  On January 11, 2006, the patient was started  back  on lithium 300 mg p.o. q.a.m. daily, propranolol 20 mg b.i.d., Ambien  10 mg p.o. q.h.s., lithium 600 mg p.o. q.h.s.  On January 11, 2006, an order was  written for guaifenesin 600 mg p.o. b.i.d., DuoNeb nebulizer treatments  t.i.d.  The Artane 2 mg p.o. b.i.d. was restarted.  Haldol 5 mg p.o. b.i.d.  was restarted.  Valium 5 mg p.o. b.i.d. was restarted.  The patient  tolerated his medications well with no significant side effects.   Upon my first session with Quante on January 11, 2006, he stated he was under a  lot of stress living with his schizophrenic mother.  He took an overdose of  Valium and Haldol (however, earlier he had stated he took an overdose of  lithium and possibly propranolol).  He states he sort of wanted to die.  He was very tremulous (? dyskinesia from Haldol).  Mood was depressed and  anxious.  Affect constricted.  There was positive suicidal ideation prior to  admission.  He was able to contract for safety on the unit.  No homicidal  ideation.  Denied auditory or visual hallucinations.  No obvious paranoia  and/or delusions noted today.  Through the weekend, on January 12, 2006 and January 13, 2006, there was no significant change in his mental status.  The  continued tremor was noted, and he was no longer suicidal.  On January 14, 2006,  the patient stated he was doing better.  He felt less depressed, he was  better groomed and well-kempt.  He continued to discuss his mother's mental  illness and describes it was difficult to live with her.  He was excited  that his 72 year old daughter visited yesterday, and he was happy about  that.  He is proud that she is Glass blower/designer in psychology.  On January 15, 2006, the  patient continued to state he felt better.  There was no significant change  in his mental status at that point.  He planned to return home to live with  a friend, but it was unclear how viable this placement was.  He still  refuses assisted-living because he is afraid he will not be  able to smoke.   On January 16, 2006, mental status had improved from admission status.  The  patient was friendly and cooperative with intermittent eye contact.  Psychomotor activity:  There was still some psychomotor retardation.  Speech  soft and slow.  Mood less depressed and anxious.  Affect constricted but  wider range than upon admission.  No suicidal or homicidal ideation.  No  self-injurious behavior.  No auditory or visual hallucinations.  No paranoia  or delusions.  Thoughts logical and goal-directed.  Thought content within  normal limits.  Cognitive exam back to baseline.  The patient was able to be  discharged to home today.  He was looking forward to this.   DISCHARGE DIAGNOSES:  AXIS I:  Schizoaffective disorder, bipolar type,  depressed, severe.  AXIS II:  Rule out personality disorder, not otherwise specified.  AXIS III:  Bronchitis, not otherwise specified.  Status post polypharmacy overdose.  Tobacco abuse.  Rule out neuroleptic-induced extrapyramidal  symptoms.  AXIS IV:  Severe (problems with housing and economic problems).  AXIS V:  Global Assessment of Functioning upon discharge was 45; Global  Assessment of Functioning upon admission was 30;  Global Assessment of  Functioning highest past year was 58.   DISCHARGE/PLANS:  There were no specific activity level or dietary  restrictions other than those advised by his primary care physician, Dr.  Melvyn Novas.   DISCHARGE MEDICATIONS:  1.  Lithium carbonate 300 mg one pill q.a.m. and two pills q.h.s.  2.  Propranolol 10 mg twice daily.  3.  Artane 2 mg twice daily.  4.  Haldol 5 mg twice daily and at bedtime.  5.  Diazepam 5 mg twice daily.  6.  Ambien 10 mg at bedtime,  7.  Mucinex 600 mg p.o. b.i.d.  8.  DuoNeb nebulizer treatments three times daily.   POST-HOSPITAL CARE PLANS:  The patient will see Dr. Archer Asa at the  [facility] on Monday, June 11 at 3:15 p.m.      Jasmine Pang, M.D.  Electronically Signed     BHS/MEDQ  D:  01/17/2006  T:  01/17/2006  Job:  [id number]   cc:   Archer Asa, M.D.

## 2010-12-29 NOTE — Discharge Summary (Signed)
NAME:  Ruben Blackwell, Ruben Blackwell NO.:  [id number]   MEDICAL RECORD NO.:  [id number]          PATIENT TYPE:  IPS   LOCATION:  0401                          FACILITY:  [facility]   PHYSICIAN:  Geoffery Lyons, M.D.      DATE OF BIRTH:  1955-11-15   DATE OF ADMISSION:  08/09/2005  DATE OF DISCHARGE:  08/22/2005                                 DISCHARGE SUMMARY   CHIEF COMPLAINT AND PRESENT ILLNESS:  This is one of multiple admissions to  [facility] for this 55 year old single white male  involuntarily committed.  He was found wandering outside without clothes,  positive auditory hallucinations, more recently hospitalized at [facility] and noncompliant with medications.   PAST PSYCHIATRIC HISTORY:  First admission October 2006, was on Lithium and  was toxic.  Lithium was discontinued and switched to Risperdal.  Since then  has had multiple hospitalizations with frequent decompensations and criminal  history.  No evidence of active alcohol or drug use.   MEDICAL HISTORY:  Noncontributory.   MEDICATIONS:  1.  Valium 5 mg three times a day.  2.  Benadryl 50 at night.  3.  Haldol 4 mg twice a day.   PHYSICAL EXAMINATION:  Performed and failed to show any acute findings.   LABORATORY WORKUP:  Blood chemistries/CBC:  White blood cells 4.7,  hemoglobin 12.7, glucose 91.  Liver enzymes:  SGOT 26, SGPT 22.  TSH 0.623.  B12, folate levels within normal limits.   MENTAL STATUS EXAM:  Male initially in bed, disheveled, unshaven, not  responsive to verbal stimuli, mumbles, auditory hallucinations.  Unable to  get a clear history from him.  Cognition could not be assessed due to the  acute psychotic process.   ADMITTING DIAGNOSES:  Axis I.  Schizoaffective disorder.  Axis II.  No diagnosis.  Axis III.  No diagnosis.  Axis IV.  Moderate.  Axis V.  On admission 25, highest in the last year 75.   COURSE IN THE HOSPITAL:  He was admitted.  He was started in  milieu  psychotherapy.  He was initially placed on Valium 5 mg three times a day.  He was on Risperdal 0.5 twice a day but he required Geodon 20 mg IM and he  was switched to Geodon 80 mg twice a day.  He required some Risperdal  acutely.  He did not well to Risperdal so we started working with Haldol and  Ativan.  As already stated he was readmitted acutely psychotic, question of  compliance.  Upon admission there was a brief history, he was very  disorganized, irrelevant, somewhat guarded, responding to internal stimuli,  somewhat irritable when questioned, ruminating about numbers, answering  questions with questions, perseverating.  He continued to be disorganized,  laughing inappropriately.  By August 14, 2005 he was already switched to  Haldol and was placed on 5 mg twice a day and then 5 mg three times a day.  He was pretty disorganized and irrelevant, idiosyncratic, __________ , some  looseness of associations but there was decreased irritation.  He continued  to comply  with the medications.  He started coming around, somewhat  disorganized but there were more moments of clarity.  Some inappropriate  behavior standing in the middle of the group and kicking what he saw as a  soccer ball.  Upon evaluation later he was demonstrating his kicking skills  kicking these imaginary soccer balls claiming that he needed to train.  He  was pretty loose and tangential.  At the same time he was tearful when  talking about his daughter and past events in his life.  Remembered a lot  about the past and the family claimed that this was new for him.  In the  next couple of days we continued the medication.  He was more in touch with  reality, less disorganized, was aware that he was ruminating about the past.  The living arrangements were not the best but he could not see any other  options.  More subdued, less spontaneous content but more organized.  Generalized pains, he was definitely better,  compliant with medications,  ruminating about the past, able to stay in reality, wanting to stay on the  medications, no side effects.  Was going to continue to see Dr. Olevia Bowens.   DISCHARGE DIAGNOSES:  Axis I.  Schizoaffective disorder.  Axis II.  No diagnosis.  Axis III.  No diagnosis.  Axis IV.  Moderate.  Axis V.  Upon discharge 50.   DISCHARGE MEDICATIONS:  1.  Valium 5 mg three times a day.  2.  Geodon 80 mg twice a day.  3.  Haldol 5 mg three times a day.  4.  Benadryl 50 mg at bedtime.   FOLLOW UP:  Dr. Olevia Bowens.      Geoffery Lyons, M.D.  Electronically Signed     IL/MEDQ  D:  09/03/2005  T:  09/04/2005  Job:  [id number]

## 2010-12-29 NOTE — Discharge Summary (Signed)
NAME:  Ruben Blackwell, DUBIE NO.:  [id number]   MEDICAL RECORD NO.:  [id number]          PATIENT TYPE:  IPS   LOCATION:  0407                          FACILITY:  [facility]   PHYSICIAN:  Geoffery Lyons, M.D.      DATE OF BIRTH:  Nov 23, 1955   DATE OF ADMISSION:  06/10/2005  DATE OF DISCHARGE:  06/12/2005                                 DISCHARGE SUMMARY   CHIEF COMPLAINT AND PRESENT ILLNESS:  This was the second admission to [facility] for this 55 year old divorced white male  involuntarily committed.  The daughter petitioned him after visiting his  home and finding him confused and not caring for himself.  Discharged  approximately three weeks prior to this admission.  He was admitted  psychotic, confused, flight of ideas, loose associations, illusions.   PAST PSYCHIATRIC HISTORY:  Second time at [facility].  A patient of  Dr. Donell Beers.   ALCOHOL/DRUG HISTORY:  Denies the active use of any substances.   MEDICAL HISTORY:  Tardive dyskinesia.   MEDICATIONS:  Lithium had been discontinued.  He was discharged on Risperdal  4 mg every 12 hours and also Valium 5 mg three times a day, Protonix 40 mg  per day.   PHYSICAL EXAMINATION:  Performed and failed to show any acute findings.   LABORATORY DATA:  Hemoglobin 12.6, BUN 5.  Liver function tests within  normal limits.   MENTAL STATUS EXAM:  Male that was evidencing anxiety, agitation, actively  delusional, some loose associations, flight of ideas.  He was disheveled,  bizarre in his behavior.  Cognition was affected by his acute psychotic  state.   ADMISSION DIAGNOSES:  AXIS I:  Schizoaffective disorder with acute  exacerbation.  AXIS II:  No diagnosis.  AXIS III:  Tardive dyskinesia.  AXIS IV:  Moderate.  AXIS V:  GAF upon admission 25; highest GAF in the last year 65.   HOSPITAL COURSE:  He was admitted.  He was started on medications.  Due to  financial difficulties, he had been discharged on  Trilafon as he could not  afford the Risperdal but he was not apparently responding to the medication.  We got him back on Risperdal. He had required some dose of Zyprexa due to  acute agitation.  He evidenced some difficulty interacting with other  patients.  He was almost threatening towards some other patient in the unit.  By October 31st, he was somewhat confused.  His electrolytes suggested some  changes.  He was evidencing the syndrome that he experienced once before  when he had to be admitted to [facility].  He was transferred to emergency  room for an evaluation as he had developed a fever.  There was no rigidity  so the possibility of neuroleptic malignant syndrome could probably be ruled  out but we wanted some more testing and stabilization.  He was evaluated at  the emergency room.  The decision was for him to be admitted to the medical  unit for what he was discharged from out unit to be admitted to [facility]  Internal Medicine  Service.   DISCHARGE DIAGNOSES:  AXIS I:  Schizoaffective disorder.  AXIS II:  No diagnosis.  AXIS III:  Tardive dyskinesia, fever, unknown in origin.  AXIS IV:  Moderate.  AXIS V:  GAF upon discharge 25.   DISPOSITION:  Discharged to [facility] to stabilize his medical status with  the possibility of transfer back to Behavioral Health to continue to address  his psychiatric disorder.      Geoffery Lyons, M.D.  Electronically Signed     IL/MEDQ  D:  07/02/2005  T:  07/02/2005  Job:  [id number]

## 2010-12-29 NOTE — H&P (Signed)
NAME:  Ruben Blackwell, Ruben Blackwell NO.:  [id number]   MEDICAL RECORD NO.:  [id number]          PATIENT TYPE:  IPS   LOCATION:  0504                          FACILITY:  [facility]   PHYSICIAN:  Jasmine Pang, M.D. DATE OF BIRTH:  1955/09/05   DATE OF ADMISSION:  01/01/2006  DATE OF DISCHARGE:                         PSYCHIATRIC ADMISSION ASSESSMENT   This is a voluntary admission to the services of Dr. Milford Cage.   This is a 55 year old divorced white male.  Apparently, he called a friend  and reported having take five 30-mg Valiums in an attempt to go to sleep.  He stated he did not care if he woke up.  He had not been able to sleep well  the last few days, and he just wanted to go to sleep.  Apparently, this  friend works at [employer], and he was trying to get more Valium.  He stated that  since his last discharge from here on May 7, there was an evening not too  long ago when the air conditioning was not working very well.  This caused  him not to sleep, and then he reports having been up for 36 hours.   PAST PSYCHIATRIC HISTORY:  His most admission here was April 30 to May 7;  prior to that, December 28 to January 10, October 29 to November 15, October  10 to May 28, 2005.  The patient has been followed by Dr. Donell Beers on an  outpatient basis.  His discharge, May 7, he had a follow-up appointment May  28.  He states he did call Dr. Caprice Renshaw office.  However, he was not worked  in.   SOCIAL HISTORY:  He graduated high school in 1977.  He worked in  Set designer prior to becoming sick.  He is divorced.  He has a 58 year old  daughter.   FAMILY HISTORY:  He states he has cousins who abuse medicine and pain  medicine, and he also had a cousin who attempted suicide.   The patient smokes three packs of cigarettes per day.  He is followed by Dr.  Jonny Ruiz at [facility].   MEDICAL PROBLEMS:  Tardive dyskinesia, Parkinson's tremor, and lumbar  surgery.   MEDICATIONS:  When last  discharged on May 7, he was discharged on Haldol 2  mg at h.s., Artane 2 mg b.i.d., lithium carbonate 450 mg b.i.d., and Valium  5 mg b.i.d.   DRUG ALLERGIES:  SULFA--HE GETS A RASH.   POSITIVE PHYSICAL FINDINGS:  He is unkempt.  He has tremor.  Other than  that, his vital signs on admission showed he is 68 inches tall, weighs 150,  temperature is 97.2, blood pressure is 151/96, pulse is 78, and respirations  are 16.  He had no other remarkable findings other than the tremor.   MENTAL STATUS EXAMINATION:  He is alert and oriented times three.  He is  disheveled.  He has poor grooming.  His gait can be normal; however, his  motor, he does have bilateral hand tremor.  He can have good eye contact.  His speech remains latent, and he mumbles at times.  His mood is anxious and  depressed.  His affect was congruent.  Thought processes:  He repeats  himself, but he was goal-oriented.  He was stating he wanted to speak to his  daughter tonight before she went to work.  Judgment and insight are poor.  Concentration and memory are intact.  He is average to below-average  intelligence.  He denies auditory or visual hallucinations.  He is not  actively suicidal; however, he does get lonesome, and he is negative for  homicidal ideation.   DIAGNOSES:  AXIS I:  Schizoaffective disorder, depressed, without psychotic  features.  AXIS II:  Deferred.  AXIS III:  Mild tardive dyskinesia.  AXIS IV:  Severe.  AXIS V:  30.   PLAN:  Admit and to re-stabilize, to optimize his meds as needed.  Toward  that end, we will give him some Ambien tonight to induce sleep, and we will  have the case manager to discuss group homes, assisted living, etc. with  him.  It is becoming fairly apparent that he is not going to be able to  maintain living independently.      Mickie Leonarda Salon, P.A.-C.      Jasmine Pang, M.D.  Electronically Signed    MD/MEDQ  D:  01/01/2006  T:  01/02/2006  Job:  [id number]

## 2010-12-29 NOTE — Consult Note (Signed)
NAME:  Ruben Blackwell, Ruben Blackwell NO.:  [id number]   MEDICAL RECORD NO.:  [id number]          PATIENT TYPE:  OBV   LOCATION:  5740                         FACILITY:  [facility]   PHYSICIAN:  Antonietta Breach, M.D.  DATE OF BIRTH:  03/23/1956   DATE OF CONSULTATION:  12/10/2005  DATE OF DISCHARGE:  12/10/2005                                   CONSULTATION   REQUESTING PHYSICIAN:  Illene Regulus, M.D.   REASON FOR CONSULTATION:  Drug overdose with 60 tablets of valium in a  suicide attempt.   HISTORY OF PRESENT ILLNESS:  The patient is a 55 year old male admitted on  December 09, 2005, with an overdose of valium as above.  The patient does have  a history of multiple depressive episodes.  He has been toxic on his lithium  in the past.  He does describe a history of lithium working well in the  past.  The patient denies current thoughts of killing himself, but admits to  being worried about his safety outside the hospital.  His mood has been  depressed with low energy and concentration.   PAST PSYCHIATRIC HISTORY:  The patient has a history of in the medical  record of schizoaffective disorder.  He has a history of admissions to [facility].  His first psychiatric admission was age 15, and [facility]  His last hospitalization was in December 2006.   PAST PSYCHOTROPICS:  Include:  1.  Lithium.  2.  Valium.  3.  Risperdal.  4.  Haldol.  5.  Geodon.  6.  Trilafon.  7.  Lamictal.   FAMILY PSYCHIATRIC HISTORY:  None known.   SOCIAL HISTORY:  Marital:  Divorced.  Children:  One daughter.  The patient  lives alone.  He is medically retired.  Religion:  [employer].   GENERAL MEDICAL PROBLEMS:  History of tardive dyskinesia.   MEDICATIONS:  The MAR is reviewed.  The patient's psychotropics include:  1.  Haldol 2 mg t.i.d.  2.  Lithium 900 mg q.h.s.  3.  Artane 2 mg b.i.d.   LABORATORY DATA:  MCV is 100.5, platelets are at 113, hemoglobin is 15.3.  Aspirin was negative.  Urine drug screen was positive for benzodiazepines.  Blood alcohol was negative.  Lithium was 0.75.  Creatinine and BUN were  within normal limits.   REVIEW OF SYSTEMS:  CONSTITUTIONAL:  Afebrile.  HEENT:  Head:  No trauma.  Eyes:  No visual changes.  Ears:  No hearing impairment.  Mouth/Throat:  No  sore throat.  NEUROLOGIC:  Unremarkable.  CARDIOVASCULAR:  No chest pain,  palpitations, or edema.  RESPIRATORY:  No coughing or wheezing.  GASTROINTESTINAL:  No nausea, vomiting, or diarrhea.  GENITOURINARY:  No  dysuria.  MUSCULOSKELETAL:  No deformities, weaknesses, or atrophy.  SKIN:  Unremarkable.  ENDOCRINE/METABOLIC:  Unremarkable.  PSYCHIATRIC:  As above.   ALLERGIES:  THE PATIENT IS ALLERGIC TO PENICILLIN AND SULFA.   EXAMINATION:  VITAL SIGNS:  Temperature 98.2, pulse 89, respirations 20,  blood pressure 139/89.   MENTAL STATUS EXAM:  Ruben Blackwell is  an alert middle-aged male sitting  supine in his partially declined hospital bed with good eye contact.  He is  oriented to the person, place, and time.  He has repetitive involuntary  movements.  His fund of knowledge and intelligence are average.  His mood is  depressed.  His affect is anxious.  Thought process shows slight  disorganization and some circumstantiality.  Thought content:  There is no  current suicidal ideation, but the patient does report some concern about  his safety outside the supportive environment of the hospital.  There are no  current auditory hallucinations (although the patient has a history of  those).  Concentration is decreased.  Memory is 3/3 immediate, 3/3 on  recall.  Judgment is intact for the need of treatment.  Insight is partial.   ASSESSMENT:   AXIS I:  Schizoaffective disorder, 295.70.   AXIS II:  Deferred.   AXIS III:  See general medical problems.   AXIS IV:  Primary support group.   AXIS V:  Thirty-five.   The patient is assessed to still be at risk to harm  himself.  He is still  being medically cleared for a suicide attempt with 60 tablets of valium.   RECOMMENDATIONS:  1.  Would continue the patient sitter.  2.  Will defer psychotropic medication changes at this time.  3.  Would admit to a psychiatric inpatient unit once medically cleared.      Antonietta Breach, M.D.  Electronically Signed     JW/MEDQ  D:  02/24/2006  T:  02/25/2006  Job:  [id number]

## 2010-12-29 NOTE — Discharge Summary (Signed)
NAME:  Ruben Blackwell, Ruben Blackwell NO.:  [id number]   MEDICAL RECORD NO.:  [id number]          PATIENT TYPE:  IPS   LOCATION:  0403                          [facility]ACILITY:  [facility]   PHYSICIAN:  Geoffery Lyons, M.D.      DATE OF BIRTH:  [birth year]   DATE OF ADMISSION:  05/22/2005  DATE OF DISCHARGE:  05/28/2005                                 DISCHARGE SUMMARY   CHIEF COMPLAINT AND PRESENT ILLNESS:  This was the first admission to [facility] for this 55 year old single white male voluntarily  admitted.  Transferred from [facility].  Initially picked up by police for  aberrant behavior.  Brought to the emergency room.  Had increased white  blood cells.  Acute renal insufficiency.  Uncertain what happened to him.  Unable to drink.  Had problems with plumbing at home.  Living alone with  some family member checking on him.  He endorsed auditory hallucinations,  denied dizziness, denies depression or suicidal ideation.   PAST PSYCHIATRIC HISTORY:  Dr. Donell Beers at [facility].  Hospitalized in  1985.   ALCOHOL/DRUG HISTORY:  Denies the active use of any substances.   MEDICAL HISTORY:  Bronchitis.   MEDICATIONS:  Lithium 300 mg in the morning, 600 mg at night, Valium 5 mg  three times a day and Benadryl 50 mg at night.   PHYSICAL EXAMINATION:  Performed and failed to show any acute findings.   LABORATORY DATA:  Upon admission to the [facility], hemoglobin was  14.1, white blood cells 17,000.  Chemistry with sodium 134, potassium 3.5,  BUN 15, creatinine 2.3, glucose 136.  Liver function tests within normal  limits.   MENTAL STATUS EXAM:  Upon admission revealed a sleepy male, disheveled, very  soft spoken, difficult to understand, will mumble things under his breath.  Mood tired.  Affect labile, inappropriate at times, responding to internal  stimuli.  Trying to cut his fingers with his other hand.  At times  disoriented.  Very poor insight.   ADMISSION  DIAGNOSES:  AXIS I:  Schizoaffective disorder.  AXIS II:  No diagnosis.  AXIS III:  Bronchitis, dehydration, tardive dyskinesia.  AXIS IV:  Moderate.  AXIS V:  GAF upon admission 30; highest GAF in the last year 55-60.   HOSPITAL COURSE:  He was admitted.  He was started in individual and group  psychotherapy.  He was given Ambien for sleep.  He was given Valium 5 mg  three times a day, Protonix 40 mg per day, and he had already been started  on Risperdal 4 mg twice a day.  Risperdal was switched to 3 mg twice a day  due to his renal problems.  Lithium was discontinued.  He was placed on  Lamictal 25 mg per day.  On May 23, 2005, he was very sedated.  He woke  up after much encouragement.  The initial assessment at [facility] was lithium toxicity, renal insufficiency secondary to lithium.  He was  apparently having problems with plumbing.  He was dehydrated as he was not  able  to get water. Was found not responsive.  There was some anxiety  involved.  Not as spontaneous.  Very reserved, guarded.  Evidencing some  irrational behavior, mumbling under his breath, hardly intelligible.  By  October 12th, he endorsed he was starting to feel better.  Still with a  thought disorder.  He was aware he could not go back on lithium due to  possibility of kidney damage.  He continued to improve, much clearer,  wanting to demonstrate his mathematic abilities, association was with dates,  numbers, events by dates and people's ages with some obsessive flair to the  way he was producing his thoughts.  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  He did not want to pursue the Cogentin.  Concerns were  his ability to be able to afford his medication.  The Risperdal cost for him  was going to be $520.  He said he could not afford it.  He wanted to be  switched, so he was switched to Trilafon.  On October 16th, he was better.  No suicidal or homicidal ideation.  No active  psychosis.  Was wanting to  pursue medication further.  Again, he was switched from Risperdal, due to  the cost, to the Trilafon that he could afford.  So we pursued the other  medications and discharged to outpatient treatment.   DISCHARGE DIAGNOSES:  AXIS I:  Schizoaffective disorder.  AXIS II:  No diagnosis.  AXIS III:  Status post dehydration, corrected, bronchitis, tardive  dyskinesia.  AXIS IV:  Moderate.  AXIS V:  GAF upon discharge 50.   DISCHARGE MEDICATIONS:  1.  Valium 5 mg three times a day.  2.  Benadryl 50 mg at night.  3.  Protonix 40 mg per day.  4.  Trilafon 4 mg twice a day.  5.  Ambien 10 mg at night for sleep.   FOLLOW UP:  Dr. Donell Beers.      Geoffery Lyons, M.D.  Electronically Signed     IL/MEDQ  D:  07/02/2005  T:  07/02/2005  Job:  [id number]

## 2010-12-29 NOTE — Discharge Summary (Signed)
NAME:  Ruben Blackwell, Ruben Blackwell NO.:  [id number]   MEDICAL RECORD NO.:  [id number]          PATIENT TYPE:  IPS   LOCATION:  0400                          FACILITY:  [facility]   PHYSICIAN:  Geoffery Lyons, M.D.      DATE OF BIRTH:  06/02/1956   DATE OF ADMISSION:  06/14/2005  DATE OF DISCHARGE:  06/27/2005                                 DISCHARGE SUMMARY   CHIEF COMPLAINT/HISTORY OF PRESENT ILLNESS:  This was the third admission to  [facility] for this 55 year old divorced white male  transferred from the medicine unit after we transferred him there for acute  medical complications.  He spiked a fever of 102, was found to have some  cough, nausea, vomiting and diarrhea.  He was treated for infectious  gastroenteritis.  He was treated with Avelox and medically stabilized.  He  has a history of schizoaffective disorder and admitted with acute confusion  disorder and inappropriate behavior.  He is living alone with questionable  medication compliance.  He was taken off lithium.  Placed on Trilafon,  unable to afford the Risperdal.  He did not do well on the Trilafon.  Evidencing exacerbation of his psychosis.  He was placed back on the  Risperdal.   PAST PSYCHIATRIC HISTORY:  Followed by Dr. Archer Asa.  This is his  third admission to [facility].   ALCOHOL AND DRUG HISTORY:  He denies the use of any substances.   MEDICAL HISTORY:  Status post infectious gastroenteritis.  History of  tardive dyskinesia.  History of renal failure and dehydration secondary to  lithium.   MEDICATIONS:  1.  Risperdal 2 mg twice a day.  2.  Protonix 40 mg per day.  3.  Valium 5 mg three times a day.   PHYSICAL EXAMINATION:  Performed and failed to show any acute findings.   LABORATORY WORKUP:  CBC white blood cells 4.6, hemoglobin 11.5.  Blood  chemistry, sodium 142, potassium 3.6, glucose 94.  Liver enzymes, SGOT 30,  SGPT 28.  Urine drug  screen positive for benzodiazepine.   MENTAL STATUS EXAM:  Reveals a fully alert male with some tardive movements,  increased motor activity, moving quite a bit around the room.  Pleasant,  wanting to engage in conversation.  No subjective complaints.  Endorsed that  he was sleeping fairly well.  His thought process was very circumstantial,  tangential, continuously wanting to grab the interviewer's hand.  Mood was  somewhat elevated, can accept redirection.  Speech was pressured, rambling,  hyperverbal.  Cognition preserved.   ADMISSION DIAGNOSES:  AXIS I:  Schizoaffective disorder.  AXIS II:  No diagnosis.  AXIS III:  Status post gastroenteritis.  Tardive dyskinesia.  AXIS IV:  Moderate.  AXIS V:  Upon admission 25, highest global assessment of function in the  last year 60.   COURSE IN HOSPITAL:  He was readmitted.  He was continued on the Risperdal.  He was on Risperdal M-tab 2 in the morning and 3 at night.  We started  Depakote.  We continued to optimize treatment  with these two medications,  going up to Risperdal 2 mg in the morning and 4 mg at night, and Depakote  250 twice a day and 500 at night.  In the early part of the hospitalization,  he continued to endorse the psychotic symptoms, being labile, some  stereotypical movements, disorganized thinking.  He denied auditory  hallucinations but did seem to be responding to internal stimuli which could  be part of his idiosyncratic thinking.  His thinking was somewhat loose and  tangential, very concrete, some fascination with numbers and associations of  numbers in terms of people, dates, events, wanting some recognition and  engagement by extending his hand expecting the interviewer to shake his hand  or do high fives.  He endorsed he was better.  He continued to be  tangential.  There was some compulsiveness.  He continued to evidence the  concrete thinking.  Overall he continued to get better.  He himself had no  subjective  complaints. There was evidence of the thought disorder, but the  thought disorder improved markedly.  He was wanting to go home.  He endorsed  that he was wanting to pursue the medication.  He felt better.  On June 27, 2005 he was indeed better, no suicidal ideas, no homicidal ideas, no  hallucinations, no active delusions.  Still some of the thought  disorganization, but he was able to be more insightful about it and get  himself back on track.   DISCHARGE DIAGNOSES:  AXIS I:  Schizoaffective disorder.  AXIS II:  No diagnosis.  AXIS III:  Status post gastroenteritis, resolved.  AXIS IV:  Moderate.  AXIS V:  Upon discharge global assessment of function 50.   DISCHARGE MEDICATIONS:  1.  Risperdal M-tabs 2 mg in the morning and 4 at night.  2.  Depakote ER 500 mg 2 at night.  3.  Cogentin 1 mg twice a day.   FOLLOW UP:  Dr. Donell Beers.      Geoffery Lyons, M.D.  Electronically Signed     IL/MEDQ  D:  07/03/2005  T:  07/04/2005  Job:  [id number]

## 2010-12-29 NOTE — H&P (Signed)
NAME:  Ruben Blackwell, Ruben Blackwell NO.:  [id number]   MEDICAL RECORD NO.:  [id number]          PATIENT TYPE:  IPS   LOCATION:  0401                          FACILITY:  [facility]   PHYSICIAN:  Jasmine Pang, M.D. DATE OF BIRTH:  [birth year]   DATE OF ADMISSION:  01/10/2006  DATE OF DISCHARGE:                         PSYCHIATRIC ADMISSION ASSESSMENT   IDENTIFYING INFORMATION:  This is a 55 year old white male who is single.  This is a voluntary admission.   HISTORY OF PRESENT ILLNESS:  This is a 55 year old with a history of  schizoaffective disorder.  Has been living with his mother since he was  discharged about a week ago from [facility].  He  got upset with her.  Said he cannot stand living with her.  She was cleaning  up and messing in his things.  This upset him and he took an overdose of  several of his medications, at least 50 tabs of Valium 5 mg, he says, along  possibly with some propranolol, not sure that he took any lithium.  He was  seen in the emergency room.  He reports that, since he went to live with his  mother, he is not eating or drinking regularly.  Not clear if he has been  taking his medications regularly.  Does not want to go back to his mother's  house.  His smoking has increased markedly.  Has been having suicidal  thoughts, that his mood is much worse and feels that I'm at the bottom of  the barrel with my depression.  He endorses suicidal thoughts.  Denies any  homicidal thoughts.  Denies any auditory or visual hallucinations.   PAST PSYCHIATRIC HISTORY:  This is one of several admissions for this  gentleman since January of this year and he is well-known to [place].  He has a  history of schizoaffective disorder.  He is followed as an outpatient by Dr.  Archer Asa.  Has been on lithium for many years.  In the past, has been  generally compliant with medications.  Has a history of prior suicide  attempts.   SOCIAL HISTORY:  The  patient graduated from high school in 1977.  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  Been  divorced for many years.  Has a 108 year old daughter that is in college  majoring in psychology that he stays in touch with.   FAMILY HISTORY:  Remarkable for some substance abuse in extended family and  has a cousin that has previously attempted suicide.   SUBSTANCE ABUSE HISTORY:  The patient does abuse approximately three packs  per day of cigarettes and has a history of overusing his Valium from time to  time.   MEDICAL HISTORY:  The patient's primary care physician is Dr. Tresa Endo,  which he has not seen in approximately a year.  Medical problems include  tobacco abuse, bronchitis not otherwise specified and pill-rolling tremor,  rule out dyskinesia secondary to antipsychotics.   CURRENT MEDICATIONS:  Valium 5 mg b.i.d., Haldol 5 mg t.i.d., lithium  carbonate 600 mg q.h.s. and  300 mg every morning, nicotine which we have  used in the past, just when he is hospitalized for smoking cessation.  We  started him on his last year on propranolol 20 mg b.i.d. to help manage the  tremor that he is having.  Also on Artane 2 mg b.i.d.  He has used Ambien 10  mg q.h.s. in the past while here but does not use this at home.   ALLERGIES:  SULFA.   POSITIVE PHYSICAL FINDINGS:  The patient's full physical examination was  done in the emergency room.  It is noted in the record.  On admission to the  unit, his vital signs are within normal limits.   PAST MEDICAL HISTORY:  Remarkable for history of longstanding tremor,  multiple psychiatric admissions and heavy tobacco abuse.   REVIEW OF SYSTEMS:  Today is remarkable for the patient smoking 2-3 packs of  cigarettes per day.  P.o. intake has decreased since he went to live with  his mother.  York Spaniel he has had a productive cough for the past two weeks and  he was treated with antibiotics at [facility] approximately two weeks  ago,  specifically azithromycin.  He denies any chest pain.  No problems with  constipation or urination.  No fever or chills.   LABORATORY DATA:  WBC 10.6, hemoglobin 14.5, hematocrit 42, platelets 311.  Electrolytes with sodium 134, potassium 4.0, chloride 101, carbon dioxide  27, BUN 14, creatinine 1.3, random glucose 117.  Liver enzymes with SGOT 14,  SGPT 15, alkaline phosphatase 71 and total bilirubin 0.8.  TSH on Jan 04, 2006 was within normal limits at 0.890 and we will not repeat that at this  time.  His INR was checked in the emergency room and it was 0.9 and his EKG  was evaluated by the emergency room physicians and he was medically cleared.  Alcohol and salicylate and acetaminophen levels were negligible and lithium  level was 0.79.   MENTAL STATUS EXAM:  Ruben Blackwell appears mildly sedated today.  He is up and about  doing his ADLs and has been in the shower.  His reaction time is slowed.  His speech is slightly slurred.  He has a persistent mild tremor at rest,  pill-rolling tremor, both hands and arms.  Shuffling gait.  He does not  readily initiate a lot of speech but, once we get him talking and ask him  questions about the home situation, he volunteers all of his frustrations  with his mother.  He is fluent and articulate.  No signs of internal  distraction.  No sure what he wants to do with himself.  He fears going to  an assisted-living facility because he thinks that he cannot smoke there.  Definitely does not want to return to his mother's.  He has the option of  going to his own home, which is still intact.  He denies any suicidal  thoughts today, readily contract for safety.  No homicidal thoughts.  His  mood has been depressed.  Cognitively, he is intact and oriented x3.   DIAGNOSES:  AXIS I:  Schizoaffective disorder, rule out bipolar-type,  depressed.  AXIS II:  Rule out personality disorder not otherwise specified with  borderline features. AXIS III:  Bronchitis  not otherwise specified, status post polypharmacy  overdose, rule out neuroleptic-induced EPS and tobacco abuse.  AXIS IV:  Severe (problems with social support system and interfamily  conflict.  AXIS V:  Current 30; past year 17.  PLAN:  To voluntarily admit the patient with 15-minute checks in place.  We  are going to start him on a nicotine patch and DuoNeb treatments t.i.d. for  some pulmonary hygiene and, if he does become febrile, then we will get a  chest x-ray on him and some guaifenesin 600 mg b.i.d. to try to resolve his  bronchitis.  We will also check his pulse oximetry b.i.d. with his vital  signs and his pulse oximetry was 95% today.  Meanwhile, we are going to  restart his current medications.  He is a bit lethargic today, so we will  start his Valium tomorrow, restart his Haldol q.h.s. and we are going to  hold his propranolol at this time which we were using for his tremor and we  are going to pursue  assisted-living placement with an alternative plan of possibly considering  sending him home with home-health follow-up to evaluate his safety at home  and medication compliance.   ESTIMATED LENGTH OF STAY:  Five days.      Margaret A. Lorin Picket, N.P.      Jasmine Pang, M.D.  Electronically Signed    MAS/MEDQ  D:  01/11/2006  T:  01/12/2006  Job:  [id number]

## 2010-12-29 NOTE — Discharge Summary (Signed)
NAME:  Ruben Blackwell, ECKSTEIN NO.:  [id number]   MEDICAL RECORD NO.:  [id number]          PATIENT TYPE:  IPS   LOCATION:  0403                          FACILITY:  [facility]   PHYSICIAN:  Anselm Jungling, MD  DATE OF BIRTH:  [birth year]   DATE OF ADMISSION:  12/10/2005  DATE OF DISCHARGE:  12/17/2005                                 DISCHARGE SUMMARY   IDENTIFYING DATA/REASON FOR ADMISSION:  This was one of many [facility] admissions  for Leonides, a 55 year old unmarried white male with a history of  schizoaffective disorder.  On December 09, 2005, he took an overdose of Valium.  He stated that he got mixed up taking his medications, and that they had  been filled improperly by the pharmacy.  Following the overdose, he was  treated at [facility] for several days.  A psychiatric consultant  was called to speak to the patient and, at that time, the patient could not  contract for safety outside of an inpatient setting.  Although the patient  had a history of schizoaffective disorder, he did not appear to be  displaying psychotic symptoms at the time of admission.  The patient came to  [place] with a history of preexisting mild tardive dyskinesia.  Please refer to  the admission note for further details pertaining to the symptoms,  circumstances and history that led to his hospitalization.   INITIAL DIAGNOSTIC IMPRESSION:  He was given an initial AXIS I diagnosis of  schizoaffective disorder, currently depressed, without psychotic features.   MEDICAL/LABORATORY:  The patient was medically and physically assessed by  the psychiatric nurse practitioner upon admission.  As referred to above, he  had been stabilized after his overdose at [facility] and medically  cleared prior to transfer to the inpatient psychiatric service.  There were  no significant issues during the psychiatric portion of his inpatient stay.   HOSPITAL COURSE:  The patient was admitted to the adult inpatient  psychiatric service.  He had been on a regimen of Haldol 2 mg t.i.d.,  lithium carbonate 900 mg q.h.s., and Artane 2 mg b.i.d.   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  He stated the main problem is I'm lonely.  The  patient had latent and mumbled speech.  He appeared to be quite depressed  with blunted affect.  He denied auditory hallucinations.  He did not make  any delusional statements or show any other outward signs or symptoms of  psychosis or thought disorder.  He indicated that he did want help.  He  indicated that he was lonely.  His mother had recently been sent to a  nursing home and he had been living alone in his own home.   The patient, over the next few days, provided a consistent picture of over-  medication.  He was sleeping excessively and, on two occasions when I went  to visit him, he was so groggy that he could  not participate in a  conversation with me.  He was also so groggy that he could not complete  ADLs.   Over the next several days, his medications were gradually scaled back,  which the patient protested, but he was eventually gradually transitioned  into a state in which he was much more alert, active on the unit, and could  converse appropriately and attend to his ADLs.  By the time of discharge,  his dose of Haldol was down to 2 mg at bedtime, with Artane 2 mg twice  daily, lithium carbonate 450 mg twice daily and Valium 5 mg twice daily  only.  The Artane appeared to have a substantial helpful impact on the  degree of his tremulousness and his parkinsonian tremor.  However, he still  remained generally tremulous.   During the course of his inpatient stay, he did receive some p.r.n. doses of  Librium to address anxiety, agitation and tremor.   By the time of  discharge, the patient was absent any suicidal ideation.  He  indicated that he felt ready to return home.  He remained free of any overt  symptoms of psychosis or thought disorder.  His mood was improved, with more  affect and some appropriate smiling.   AFTERCARE:  The patient was to follow-up with his usual psychiatrist, Dr.  Donell Beers.  Dr. Caprice Renshaw office was to call the patient at his home for an  appointment.   DISCHARGE MEDICATIONS:  1.  Haldol 2 mg q.h.s.  2.  Artane 2 mg b.i.d.  3.  Lithium carbonate 450 mg b.i.d.  4.  Valium 5 mg b.i.d.   DISCHARGE DIAGNOSIS:  AXIS I:  Schizoaffective disorder, most recently  depressed, without psychotic features.  AXIS II:  Deferred.  AXIS III:  No acute or chronic illnesses, mild tardive dyskinesia.  AXIS IV:  Stressors:  Severe.  AXIS V:  GAF on discharge 60.           ______________________________  Anselm Jungling, MD  Electronically Signed     SPB/MEDQ  D:  12/18/2005  T:  12/18/2005  Job:  [id number]

## 2010-12-29 NOTE — Discharge Summary (Signed)
NAME:  Ruben Blackwell, Ruben Blackwell NO.:  [id number]   MEDICAL RECORD NO.:  [id number]          PATIENT TYPE:  IPS   LOCATION:  0307                          FACILITY:  [facility]   PHYSICIAN:  Geoffery Lyons, M.D.      DATE OF BIRTH:  [birth year]   DATE OF ADMISSION:  01/25/2006  DATE OF DISCHARGE:  01/30/2006                                 DISCHARGE SUMMARY   CHIEF COMPLAINT AND PRESENT ILLNESS:  This was one of multiple admissions to  [facility] for this 55 year old divorced white male who  called the suicide hotline.  Had taken some extra Valium.  Claimed he wanted  to kill himself.  Upon admission, denied any suicidal or homicidal ideas.  Poor sleep.  Depressed mood.   PAST PSYCHIATRIC HISTORY:  Multiple inpatient stays.  Outpatient with Dr.  Donell Beers.   BACKGROUND HISTORY:  He denies active use of any substances.   MEDICAL HISTORY:  1.  Bronchitis.  2.  Tremor.   MEDICATIONS:  1.  Valium 5 mg twice a day.  2.  Inderal 10 mg twice a day.  3.  Ambien 10 at bedtime for sleep.  4.  Lithium 300, 1 in the morning, 2 at night.  5.  Artane 2 mg twice a day.  6.  Haldol 5 mg twice a day and at bedtime.   PHYSICAL EXAMINATION:  Physical exam performed.  Failed to show any acute  findings.   LABORATORY WORKUP:  CBC:  White blood cells 12.9, hemoglobin 14.3.  Blood  chemistries:  Sodium 136, potassium 4,2, glucose 104, BUN 19, creatinine  1.3.  Drug screen positive for benzodiazepines.   MENTAL STATUS EXAM:  Reveals an alert, cooperative male, good eye contact,  pretty flat affect, disheveled.  Speech slow, slow production.  Mood  depressed.  Affect constricted.  Thought processes were clear, coherent, and  relevant, but not spontaneous.  No evidence of delusional ideas.  Denied any  active suicidal or homicidal ideation.  Cognition well preserved.   ADMITTING DIAGNOSES:   AXIS I:  Schizoaffective disorder.   AXIS II:  No diagnosis.   AXIS III:   Bronchitis.  Tremor.   AXIS IV:  Moderate.   AXIS V:  Upon admission 35, highest global assessment of function in the  last year 60.   COURSE IN THE HOSPITAL:  He was admitted.  He was started on individual and  group psychotherapy.  He did admit that he got upset.  His mother was  admitted to [facility].  He was feeling pretty overwhelmed.  Felt  suicidal.  By January 28, 2006, he was having a hard time.  Endorsed that he  was having a hard time as he cannot afford the medications.  The Seroquel he  cannot afford, though it helps him.  Would be able to get the Haldol.  Was  going back to his house.  Felt that once the mother was discharged from  [facility] he would be able to be with her some and that would help with  the aloneness.  By January 27, 2006, he  was getting better.  No overt signs or  symptoms of psychosis.  Much clearer in terms of his thinking.  He was  worried about his ability to afford medications.  On January 30, 2006, he was  in full contact with reality.  There were no suicidal or homicidal ideation,  no hallucinations, no delusions.  Indeed, this has been probably the best  hospitalization; this is the time he has done the best.  Was going back to  his house.  Mother was back in her house from [place].  He was going to  buy some medications as prescribed.  He was given plenty of samples of  Seroquel until he could get hooked with the patient assistance program.   ADMITTING DIAGNOSES:   AXIS I:  Schizoaffective disorder.   AXIS II:  No diagnosis.   AXIS III:  Bronchitis, resolved.  Tremor.   AXIS IV:  Moderate.   AXIS V:  Upon discharge, 50-55.   DISCHARGED ON:  1.  Valium 5 mg twice a day.  2.  Ambien 10 at bedtime for sleep.  3.  Lithium carbonate 300, 1 in the morning, 2 at night.  4.  Haldol 5, 1 twice a day and at bedtime.  5.  Inderal 20 mg twice a day.  6.  Artane 2 mg 3 times a day.  7.  Seroquel 100, 1/2 at bedtime.   FOLLOWUP:  With Dr.  Donell Beers.      Geoffery Lyons, M.D.  Electronically Signed     IL/MEDQ  D:  02/13/2006  T:  02/13/2006  Job:  [id number]

## 2010-12-29 NOTE — H&P (Signed)
NAME:  Ruben Blackwell, Ruben Blackwell NO.:  [id number]   MEDICAL RECORD NO.:  [id number]          PATIENT TYPE:  IPS   LOCATION:  0407                          FACILITY:  [facility]   PHYSICIAN:  Geoffery Lyons, M.D.      DATE OF BIRTH:  05-Oct-1955   DATE OF ADMISSION:  06/10/2005  DATE OF DISCHARGE:                         PSYCHIATRIC ADMISSION ASSESSMENT   IDENTIFYING INFORMATION:  The patient is a 55 year old divorced white male  who was involuntarily admitted to the [facility] on June 10, 2005.   HISTORY OF PRESENT ILLNESS:  The patient's daughter petitioned on the  patient after visiting his home and finding him confused, not caring for  himself.  The patient was recently discharged from the [facility] approximately 3 weeks ago.  He is currently psychotic, with  confusion, flight of ideas, loose associations, word salad, and delusions.   PAST PSYCHIATRIC HISTORY:  This is his second hospitalization at [facility].  He is reportedly a patient of Dr. Caprice Renshaw.   SOCIAL HISTORY:  Unknown.   FAMILY HISTORY:  He has a mother with bipolar disorder per the chart.   ALCOHOL AND DRUG HISTORY:  He denies any use.   PAST MEDICAL HISTORY:  His primary care Arly Salminen is unknown.  Medical  problems include Parkinson's disease and tardive dyskinesia.   MEDICATIONS:  Lithium 300 mg at bedtime.  Apparently he has become toxic on  lithium in the past.  Diazepam 5 mg 3 times daily, Benadryl 50 mg at  bedtime, Risperdal 1 mg q.6h and 4 mg q.12h.  Tylenol 650 mg q.4h as needed,  Protonix 40 mg daily, and Guaifenesin 600 mg twice daily.   DRUG ALLERGIES:  PENICILLIN, SULFA.   POSITIVE PHYSICAL FINDINGS:  His physical examination was performed at  [facility].  Here at the [facility] he  is well-nourished, well-developed man who appears appropriate for his stated  age of 3 years.  His recent vital signs:   Temperature unknown, pulse 95,  respirations 18, blood pressure 106/72.  IN GENERAL:  He is 68.5 inches tall, 170 pounds.   LABORATORY DATA:  Hemoglobin is 12.6 and hematocrit 37.5, both of which are  low.  He also has a low BUN of 5.  His liver function tests were normal.  His urine drug screen was positive for benzodiazepines, his urinalysis had a  trace of leukocytes.   MENTAL STATUS EXAM:  The patient is delusional, expressing word salad, loose  associations, flight of ideas.  His appearance is disheveled, his behavior  is bizarre, his speech is word salad, loose associations, flight of ideas.  His mood is psychotic.  His thought process is confused, delusional.  His  cognitive function:  His concentration is impaired, his memory is impaired,  his insight and judgment are poor.   ADMISSION DIAGNOSIS:  AXIS I:  Schizoaffective disorder, psychosis not  otherwise specified.  AXIS II:  Deferred.  AXIS III:  Parkinson's disease and tardive dyskinesia.  AXIS IV:  Difficult to determine, most likely severe.  AXIS V:  Current global assessment  of function of 18, with a past year of  42.   INITIAL PLAN OF CARE:  Admit the patient involuntarily.  We will work to  administer his medications and stabilize the patient to thought and mood.  We will work to increase his coping skills, decrease his stressors, and he  can followup with Dr. Donell Beers upon discharge.  Case worker may need to make  some arrangements for continued closer observation in his living situation.   ESTIMATED LENGTH OF STAY:  9-12 days.      Yolande Jolly, PA      Geoffery Lyons, M.D.  Electronically Signed    AHW/MEDQ  D:  06/10/2005  T:  06/10/2005  Job:  [id number]

## 2010-12-29 NOTE — H&P (Signed)
NAME:  Ruben Blackwell, Ruben Blackwell NO.:  [id number]   MEDICAL RECORD NO.:  [id number]            PATIENT TYPE:   LOCATION:                                 FACILITY:   PHYSICIAN:  Rosalyn Gess. Norins, M.D. LHCDATE OF BIRTH:  12/09/2003   DATE OF ADMISSION:  12/09/2005  DATE OF DISCHARGE:                                HISTORY & PHYSICAL   CHIEF COMPLAINT:  Inability to walk and drug overdose.   HISTORY OF PRESENT ILLNESS:  [name] is a 55 year old gentleman with  known schizoaffective disorder with multiple admissions to psychiatric  hospital who presents after a drug overdose.  Recently he has had increased  depression.  He was seen at mental health and given some unknown medication  and sent home.  The patient reports suicidal ideation.  However, on further  discussion, he says his greatest fear is being able to get to sleep tonight  because of an uncertain and uncomfortable home situation.  He took  approximately fifty 5 mg valium at 12 noon.  He subsequently is weak and  unable to walk for EDP.  On my examination, the patient did have normal  motor strength, and he is now admitted on 24-hour observation because of  drug overdose.   PAST MEDICAL HISTORY:   SURGICAL:  Drainage of pilonidal cyst.   MEDICAL:  1.  History of schizoaffective disorder.  2.  History of tardive dyskinesia.   CURRENT MEDICATIONS:  1.  Lithium 900 mg daily.  2.  Valium 5 mg 4 times a day.  3.  __________ 2 mg twice daily.  4.  Haldol 2 mg 3 times a day.   HABITS:  Tobacco: 1 pack per day.   FAMILY HISTORY:  Mother with dementia.  There is a positive family history  for diabetes, positive family history for obesity.   SOCIAL HISTORY:  The patient has an uncertain living situation.  His family  does look after him.  He has been living with his mother. When he is stress,  however, with her dementia, this is difficult.  It is uncertain what type of  social support he truly has.   REVIEW OF SYSTEMS:  Negative for any constitutional, cardiovascular,  respiratory, or GI complications or problems.   PHYSICAL EXAMINATION:  VITAL SIGNS: On admission, temperature 98.7, blood  pressure 140/80, pulse 69, respirations 16.  O2 saturation 98%.  GENERAL APPEARANCE:  A somewhat disheveled gentleman in no acute distress.  HEENT: Normocephalic and atraumatic.  Conjunctivae and sclerae clear.  NECK: Supple without thyromegaly.  No lymphadenopathy was noted.  CHEST: Clear with no rales, wheezes, or rhonchi.  CARDIOVASCULAR: 2+ radial pulse.  He had a regular rate and rhythm without  murmurs, rubs, or gallops.  No JVD, no carotid bruits.  ABDOMEN: Soft, no guarding or rebound.  GENITALIA AND RECTAL:  Exams deferred to Dr. Jonny Ruiz per outpatient.  EXTREMITIES: Without clubbing, cyanosis, or edema.  NEUROLOGIC: The patient is awake and alert. He is oriented to person, place,  context, situation.  Speech is clear.  Cranial nerves II-XII grossly intact  and normal  with no abnormalities noted.  Motor strength was 4+/5 and equal  to testing.  The patient was able to stand.  He was able to walk with  minimal assistance.  Cerebellar function: The patient has a low-frequency  parkinson-type tremor.  He had 1+ cogwheeling.  PSYCHIATRIC:  The patient denies suicidal ideation.  He does admit to  fearfulness and anxiety.  He is not hallucinating, does not seem to have any  auditory signaling.   LABORATORY DATA:  UA was negative.  Urine drug screen positive for  benzodiazepines, otherwise negative.  Lithium level 0.75 mEq per liter which  is therapeutic.  Salicylate less than 4.  Acetaminophen less than 10.  ETOH  negative.  Hemoglobin 14.7 g, white count 10,100.  Chemistries were  unremarkable with a creatinine of 1.5.   ASSESSMENT AND PLAN:  1.  Drug overdose. The patient has taken approximately 300 mg of Valium.      Contacted [employer] and spoke with [place].  Diazepam have a       toxic half life for 20 to 80 hours.  She concurred with the plan for 24-      hour observation, and after 20 hours post ingestion that the patient is      awake, alert, with no respiratory distress, should be safe for      discharge.  2.  Psychiatric, in a patient with overdose as noted.  He denies active      suicidal ideation.  Will need to reassess the patient in the a.m. in      terms of whether or not he needs psychiatric admission.           ______________________________  Rosalyn Gess Norins, M.D. [facility]     MEN/MEDQ  D:  12/09/2005  T:  12/09/2005  Job:  [id number]   cc:   Corwin Levins, M.D. [facility]  [address]  [place]  [place] [address]

## 2010-12-29 NOTE — Discharge Summary (Signed)
NAME:  Ruben Blackwell, Ruben Blackwell             ACCOUNT NO.:  [id number]   MEDICAL RECORD NO.:  [id number]          PATIENT TYPE:  INP   LOCATION:  3006                         FACILITY:  [facility]   PHYSICIAN:  Rene Paci, M.D. LHCDATE OF BIRTH:  Oct 21, 1955   DATE OF PROCEDURE:  DATE OF DISCHARGE:  05/22/2005                      STAT - MUST CHANGE TO CORRECT WORK TYPE   DISCHARGE DIAGNOSES:  1.  Schizoeffective disorder.  2.  Dehydration with acute renal insufficiency.  3.  Probable bronchitis.   HISTORY OF PRESENT ILLNESS:  The patient is a 55 year old male with a  history of schizoaffective disorder who was apprehended by law enforcement  for abhorrent behavior in public.  During the patient's medical clearance  for mental health commitment, he was found to have a leukocytosis and acute  renal insufficiency.  The patient was admitted for IV fluids and medications  as well as psychiatric evaluation.   PAST MEDICAL HISTORY:  1.  Schizoaffective disorder.  2.  Tardive dyskinesia.   HOSPITAL COURSE:  Problem 1.  Dehydration with acute renal insufficiency.  The patient was admitted.  A lithium level was obtained, with a value of  0.43.  The patient was treated with IV hydration for a creatinine of 2.3  which on May 20, 2005 was found to be 1.5 after hydration.   Problem 2.  Schizoaffective disorder.  The patient was extremely agitated on  admission and was given a 24-hour sitter.  A psychiatric evaluation was  obtained by Dr. Jeanie Sewer who recommended psychiatric admission for further  stabilization.  The patient is agreeable.  Lithium has been resumed as of  this morning on May 22, 2005 at the patient's outpatient dosing.   Problem 3.  Bronchitis.  The patient was noted to have an elevated white  count at the time of admission and cough and was treated with Zithromax,  with good improvement.   DISCHARGE MEDICATIONS:  1.  Lithium 300 mg p.o. q.a.m., lithium 600 mg p.o. q.p.m.  2.  Diazepam 5 mg p.o. t.i.d.  3.  Benadryl 50 mg p.o. q.h.s.  4.  Risperdal 4 mg p.o. q.12h.  5.  Guiafenesin 600 mg p.o. b.i.d.  6.  Protonix 40 mg p.o. daily.  7.  Tylenol 650 mg p.o. q.4h. p.r.n.   LABORATORY DATA AT DISCHARGE:  BUN 5, creatinine 1.5.  Hemoglobin 11.4,  hematocrit 32.6, platelets 307, white blood cell count 8.9.   PHYSICAL EXAMINATION:  VITAL SIGNS:  Blood pressure 136/92, heart rate 81,  respiratory rate 20, temperature 98.3, O2 saturation 97% on room air.  GENERAL:  The patient has a flat affect.  He is calm and in no acute  distress.  CARDIOVASCULAR:  S1 and S2.  Regular rate and rhythm.  LUNGS:  Clear to auscultation bilaterally.  No wheezing, rhonchi, or rales.  ABDOMEN:  Soft, nontender, nondistended, with a small reducible umbilical  hernia.  EXTREMITIES:  No peripheral edema noted.  NEUROLOGIC:  Awake, alert, calm, no acute distress.   FOLLOW UP:  The plan is to transfer the patient to the inpatient psychiatric  unit for further stabilization.  He is stable from  a medical standpoint.  After discharge from the inpatient psychiatric unit, the patient will need  outpatient followup with his primary care physician, Dr. Oliver Barre at  [facility].      Melissa S. Peggyann Juba, NP      Rene Paci, M.D. [facility]  Electronically Signed    MSO/MEDQ  D:  05/22/2005  T:  05/22/2005  Job:  [id number]   cc:   Corwin Levins, M.D. [facility]  [address]  [place]  [place] [address]

## 2010-12-29 NOTE — H&P (Signed)
NAME:  Ruben, Blackwell NO.:  [id number]   MEDICAL RECORD NO.:  [id number]          PATIENT TYPE:  INP   LOCATION:  1830                         FACILITY:  [facility]   PHYSICIAN:  Rosalyn Gess. Norins, M.D. LHCDATE OF BIRTH:  November 03, 1955   DATE OF ADMISSION:  05/17/2005  DATE OF DISCHARGE:                                HISTORY & PHYSICAL   CHIEF COMPLAINT:  Aberrant behavior.   HISTORY OF PRESENT ILLNESS:  Ruben Blackwell is a 55 year old schizoaffective  patient who was apprehended by law enforcement for aberrant behavior in  public.  During his medical clearance for mental health commitment, he was  found to have a leukocytosis and acute renal insufficiency.  Patient does  not or cannot give a history other than to deny pain and has reported he has  not been taking his medications.  He does not known when he has had his last  meal or when he has had any beverage.  He is now admitted for IV fluids and  medications.   PAST SURGICAL HISTORY:  Excision of a pilonidal cyst.   PAST MEDICAL HISTORY:  1.  History of schizoaffective disorder.  2.  Tardive dyskinesia.   MEDICATIONS:  As of March 12, 2005.  1.  Lithium 300 mg q.a.m. and 600 mg q.p.m.  2.  Valium 5 mg t.i.d.  3.  Benadryl 50 mg nightly.   ALLERGIES:  NO KNOWN DRUG ALLERGIES.   HABITS:  Tobacco one pack per day.  No alcohol.   FAMILY HISTORY:  Mother with mental illness.  Positive family history for  diabetes.   PHYSICAL EXAMINATION ON ADMISSION:  GENERAL APPEARANCE:  This is a  disheveled gentleman in four-point leather restraints who is resting  quietly.  He is ill kempt.  VITAL SIGNS:  Temperature 100.5 which defervesced to 98 prior to my seeing  the patient.  Blood pressure 150/86, heart rate 95, respirations 18.  HEENT:  Normocephalic and atraumatic.  He is missing most of his teeth.  His  mucous membranes are parched and very dry.  Conjunctivae and sclerae are  clear.  NECK:  Supple.  NODES:  No  adenopathy was noted in the cervical, supraclavicular or inguinal  region.  CHEST:  Patient is moving air well.  There are no wheezing, rhonchi or  rales.  No increased work of breathing.  CARDIOVASCULAR:  There were 2+ radial pulses.  He had a quiet precordium  with regular rate and rhythm.  ABDOMEN:  Soft with positive bowel sounds with no organo splenomegaly, no  guarding, no rebound.  GENITALIA:  Normal male.  RECTAL:  Exam deferred.  EXTREMITIES:  Without clubbing, cyanosis, or edema.  No deformity.  NEUROLOGIC:  Patient is awake.  He does report hearing usual voices.  His  exam is otherwise nonfocal.   LABORATORY DATA:  Hemoglobin 14.1 g, white count 17,000 with 88% segs, 5%  lymphs, 7% monos.  Chemistries with a sodium of 134, potassium 3.5, chloride  98, CO2 22, BUN 15, creatinine 2.3, glucose 136.  LFTs normal.  UA negative.  Drug screen negative.  Chest x-ray  without infiltrates or other  abnormalities.   ASSESSMENT/PLAN:  1.  Acute renal insufficiency.  Patient probably has renal insufficiency      secondary to dehydration.  In July his creatinine was 1.3.  I also      question lithium toxicity.  PLAN:  Hydration with normal saline at 100      mL an hour for two liters and then half normal saline at 75 mL an hour.  2.  Psychiatric:  Patient with schizoaffective disordered, followed by      Archer Asa, M.D.  Patient admits to not taking his medications.      PLAN:  Will obtain a lithium level.  Will hold lithium until the      patient's creatinine is normal.  Will resume valium 5 mg t.i.d. and      Risperdal ODT 4 mg p.o. q.6h. p.r.n.  3.  Infectious disease:  Patient with leukocytosis but no __________      infection.  Suspect this may be demargination secondary to dehydration.      PLAN:  No antibiotics at this time.  Will recheck a CBC after the      patient is fully hydrated.           ______________________________  Rosalyn Gess. Norins, M.D. [facility]     MEN/MEDQ   D:  05/18/2005  T:  05/18/2005  Job:  [id number]   cc:   Corwin Levins, M.D. [facility]  [address]  [place]  [place] [address]   Archer Asa, M.D.

## 2010-12-29 NOTE — H&P (Signed)
NAME:  Ruben Blackwell, OLDEN NO.:  [id number]   MEDICAL RECORD NO.:  [id number]          PATIENT TYPE:  IPS   LOCATION:  0400                          FACILITY:  [facility]   PHYSICIAN:  Geoffery Lyons, M.D.      DATE OF BIRTH:  20-Jun-1956   DATE OF ADMISSION:  06/14/2005  DATE OF DISCHARGE:                         PSYCHIATRIC ADMISSION ASSESSMENT   TIME OF EXAM:  11:00 a.m.   IDENTIFYING INFORMATION:  This is a 55 year old divorced white male.  This  is a voluntary admission.   HISTORY OF PRESENT ILLNESS:  This patient was transferred from the medicine  unit after we transferred him over there for an intervening stay.  At that  time he had been admitted to our adult psychiatric unit here at [facility] on June 10, 2005 but spiked a fever to 102 at  night and was found to have some cough, nausea, vomiting and diarrhea.  He  was treated for infectious gastroenteritis, was not found to have any acute  bronchitis.  He was treated with Avelox and medically stabilized.  Patient  has a history of schizo-affective disorder and had previously been admitted  with acute confusion, disorientation and inappropriate behavior.  He lives  alone, and his medication compliance was questionable.  He had previously  been discharged from [facility] after a stay here  from May 22, 2005 to May 28, 2005.  At that time he had been treated  for schizo-affective disorder having had an episode of fever, dehydration  and had been lithium toxic.  He was taken off his lithium at that time and  stabilized on Trilafon, being unable to afford the Risperdal that he had  previously been on.  He is currently on the unit for continued thought  disorder and acute exacerbation of a schizo-affective disorder.   PAST PSYCHIATRIC HISTORY:  The patient is followed by Dr. Archer Asa.  This is his third or fourth admission to [facility]  with the previous ones noted as above.  He was previously managed for many  years on lithium, a total of 900 mg per day in divided doses, and had also  at one point been on Risperdal 2 mg p.o. b.i.d.  On previous admission as  noted above he had become lithium toxic and had been taken off the lithium  and stabilized on Risperdal 2 mg p.o. b.i.d. which was later switched to  Trilafon 4 mg p.o. b.i.d.  At one point he had also been on Lamictal and it  was unclear why that was discontinued.  Patient has been quite functional  and able to function independently when he is at his baseline.   SOCIAL HISTORY:  Divorced white male who lives alone in his own apartment.  He has a 66 year old daughter who is in school in [place], [place],  that he stays in touch with.  No current legal problems.   FAMILY HISTORY:  Remarkable for a mother with bipolar disorder.   ALCOHOL AND DRUG HISTORY:  Patient has no history of substance abuse other  than tobacco abuse.  He is a smoker.  Smokes about 1/2 to 1 pack daily.   MEDICAL HISTORY:  Patient is followed by Dr. Oliver Barre, his primary care  physician at [facility].   MEDICAL PROBLEMS:  He is status post infectious gastroenteritis.  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  He  also has a past history of acute renal failure and dehydration on lithium.  Tardive movements have been attributed to history of long-term use of Haldol  in the distant past.  He has a history of being on lithium for 25 years.  Past medical history is also remarkable for brain concussion at age 86 and a  pilonidal cyst with two prior surgeries.  His dentition is very poor with  multiple missing teeth.  Patient himself reports that his tardive movements  he feels are much better since he went off Haldol several years ago.   CURRENT MEDICATIONS:  1.  Risperdal, most recently he has been receiving approximately 2 mg  p.o.      b.i.d.  2.  Lithium previously had been on 300 mg p.o. q.a.m. lithium carbonate, and      600 mg q.p.m.  He has been off this now for a couple of months.  3.  Protonix 40 mg daily.  4.  Nicotine patch 21 mg for smoking cessation when he has been on the unit.  5.  Guaifenesin 600 mg p.o. b.i.d. for respiratory infection about a month      ago.  6.  Valium 5 mg t.i.d. which he has been on for several years.  7.  Trilafon 4 mg p.o. b.i.d. recently which did seem to stabilize him at      one of his previous admissions.  Medication compliance at home is not known.   MEDICAL ALLERGIES:  PENICILLIN and SULFA MEDICATIONS.   PHYSICAL EXAMINATION:  A full physical examination was done on the medical  unit and is noted in the record.   DIAGNOSTIC STUDIES:  WBC 4.6, hemoglobin 11.5, hematocrit 34.2, platelets  254,000.  Sodium 142, potassium 3.6, chloride 107, carbon dioxide 29, BUN 8,  creatinine 1.4, glucose 94.  SGOT 30, SGPT 28, alkaline phosphatase 69,  total bilirubin 0.6.  His urine drug screen was positive for benzodiazepine.  The last lithium level that was checked on him was on June 12, 2005 and  that was less than 0.25.   MENTAL STATUS EXAM:  This is a fully alert male with tardive movements, both  in posture and extremity, and around his neck and mouth, mild-to-moderate.  Increased motor activity, moving quite a bit around the room.  Pleasant,  quick smile, sociable, wanting to engage in conversation.  No subjective  complaints today.  He says he slept fairly well, pleasant and directable but  a bit intrusive, wants to stand near me, touch me frequently and wants to  repeatedly grab my hand and hold onto it while he is talking to me.  Mood is  somewhat elevated.  He is able to accept redirection.  Speech is pressured,  rambling, hyperverbal.  He is able to give some short answers but quickly gets into a tangential pattern and at times his responses are actually   irrelevant.  Thought processes tangential with poor insight.  Judgment and  impulse control impaired.  Cognitively he is intact x 3.  He knows the  holidays, the day of the week, place and situation.  ADMISSION DIAGNOSES:  AXIS I:  Schizo-affective disorder, acute  exacerbation.  AXIS II:  Deferred.  AXIS III:  Mild normocytic anemia.  Status post gastroenteritis.  Tardive  dyskinesia.  AXIS IV:  Significant issues with living alone and maintaining his  medications, medical problems of poor dentition.  AXIS V:  Current 25, past year 58-65 estimated.   PLAN:  Voluntarily admit the patient with every 15 minutes checks in place.  Continue to stabilize his mood and help clear his thinking.  Stabilized his  mood lability.  We have elected to start him on Depakote 250 mg ER p.o.  b.i.d.  At this point we are going to continue his Risperdal at 2 mg p.o.  b.i.d. and also made available Risperdal 1 mg p.o. q.6 h. p.r.n. for  agitation and Cogentin 1 mg p.o. q.4 h. p.r.n.  Meanwhile we are going to  monitor his closely.  We have reviewed findings from internal medicine and at this point they  recommend monitoring him but no particular intervention for the mild  normocytic anemia that he has.  We discussed the plan with the patient, and  he is in agreement.  Estimated length of stay is 7-8 days.      Margaret A. Scott, N.P.      Geoffery Lyons, M.D.  Electronically Signed    MAS/MEDQ  D:  06/15/2005  T:  06/15/2005  Job:  [id number]

## 2010-12-29 NOTE — Discharge Summary (Signed)
NAME:  Ruben Blackwell, Ruben Blackwell NO.:  [id number]   MEDICAL RECORD NO.:  [id number]          PATIENT TYPE:  IPS   LOCATION:  0504                          FACILITY:  [facility]   PHYSICIAN:  Jasmine Pang, M.D. DATE OF BIRTH:  1956-04-19   DATE OF ADMISSION:  01/01/2006  DATE OF DISCHARGE:  01/06/2006                                 DISCHARGE SUMMARY   IDENTIFICATION:  This is a voluntary admission to my service on Jan 01, 2006  for this 55 year old divorced Caucasian male.   HISTORY OF PRESENT ILLNESS:  The patient apparently called a friend and  reported having taken 5 30 mg Valium in an attempt to go to sleep.  He  stated he did not care if he woke up he had not been able to sleep well the  past few days and just wanted to go to sleep.  Apparently his friend works  at [employer] and [place] has been trying to get more Valium. He stated that since  his last discharge from here on May 7, there was an evening not too long ago  when the air conditioning was not working well. This caused him not to be  able to sleep and he reports he has been up for 36 hours. .   ADMISSION MENTAL STATUS EXAM:  The patient was alert and oriented x3.  He  was disheveled.  He had poor grooming.  Gait was normal, however he does  have a bilateral hand tremor.  He has good eye contact.  Speech remains late  and then he mumbles at times.  His mood was anxious and depressed.  His  affect was congruent.  Thought processes:  He repeats  himself but he was  goal oriented.  He was stating he wanted to speak to his daughter tonight  before she went to work.  Judgment and insight were poor,  concentration and  memory were intact.  He is average to below average intelligence.  He denies  auditory or visual hallucinations.  He is not actively suicidal however he  does get lonesome.  He is negative for homicidal ideation.   PAST PSYCHIATRIC HISTORY:  The patient's most recent admission here was  April 30 to Dec 17, 2005.  Prior to that he was admitted December 28 to  January 10 of 2007, October 29 to June 27, 2005, October 10 to May 28, 2005.  The patient has been followed by Dr. Donell Beers on outpatient basis.  His discharge was May 7.  He has had a follow-up appointment May 28 he  states he did call Dr. Caprice Renshaw office, however, he was not worked in.   SOCIAL HISTORY:  The patient graduated from high school in 1977.  He works  in  Set designer prior becoming sick.  He is divorced.  He is a 25 year old  daughter.  He is very proud of her and states she is Glass blower/designer in psychology.   FAMILY HISTORY:  The patient states that his cousin who abuse medication and  pain medication. He also has a cousin who has attempted .suicide.  SUBSTANCE ABUSE:  The patient smokes three packs of cigarettes per day.  He  has been abusing his Valium as per HPI.   MEDICAL PROBLEMS:  Tardive dyskinesia or Parkinson's tremor,  lumbar  surgery.   MEDICATIONS FROM LAST DISCHARGE:  When last discharged on May 7, he was  discharged on Haldol 2 mg h.s., Artane 2 mg  b.i.d. and lithium carbonate  450 mg p.o. b.i.d. and Valium 5 mg p.o. b.i.d.   DRUG ALLERGIES:  SULFIDE (He gets a rash.   PHYSICAL EXAM:  The patient was unkempt.  He had a tremor.  Other than that  vital signs on admission show he was 68 inches tall, weighed 150 pounds,  temperature was 97.2, blood pressure 151/96, pulse 78, respirations 16.  He  had no remarkable other physical findings other than the tremor. Complete  physical exam was done by our physician's assistant [facility].   LABORATORIES:  A number of labs were apparently done prior to admission in  the ED.  Urinalysis done here was negative.Marland Kitchen   HOSPITAL COURSE:  Upon admission the patient was continued on Artane 2 mg  p.o. b.i.d. and lithium carbonate 450 mg p.o. b.i.d. He was also started on  Haldol 5 mg p.o. t.i.d. . On Jan 01, 2006 the patient was started on Ambien  10 mg p.o.  q.h.s.  An order was written to explore assisted-living for the  patient but he did not want to pursue this. On Jan 01, 2006 Valium 5 mg p.o.  q.h.s., now was given and then 5 mg p.o. b.i.d. On Jan 02, 2006, the patient  was started on propranolol 10 mg one p.o. b.i.d. for lithium tremor. On Jan 02, 2006 an  a.m. lithium level and basic Chem panel was ordered for May 25.  They are not in the chart yet and I will review these. On Jan 02, 2006, the  patient was given an extra lithium 450 mg p.o. q.h.s. since he states that  he is on this on a daily basis.  The patient e he is on this on a daily  basis.  The previous lithium order was discontinued. The patient was started  lithium carbonate 300 mg p.o. q.a.m. and 600 mg p.o. q.h.s. which he states  he was on prior to admission. On Jan 03, 2006 propranolol was increased to  20 mg p.o. b.i.d. for lithium tremor.  It appeared to be helping his tremor  somewhat.  The patient tolerated these medications well with no significant  side effects.   On Jan 01, 2006, I  met the patient the first time. He had a lot of tremor,  EPS and probable tardive dyskinesia as well.  He was unclear whether this  was all EPS and Haldol or possible lithium tremor as well.   Patient is currently on Artane. He states that he has schizoaffective  disorder and that his mother has schizophrenia.  He was worried about sleep  and has not had any in several days.  He states he felt lonely  before and  had thoughts of suicide.  He took a Valium overdose. He was worried about  his car which is malfunctioning.  He  was also worried about his mother who  is in bad shape.  He has conflict with her at times. On Jan 02, 2006 the  patient complained of insomnia with middle of the night awakening.  He still  had an EPS tremor and possible lithium tremor.  He denies psychosis before admission this time. He states he has been psychotic before and when  admitted he primarily was  depressed and had overdosed.  He stated a major  stressors is his mother's paranoid schizophrenia.  He is concerned because  she believes there are invisible people who get into her house.  He does  well on lithium and wanted to make sure he stays on this. He again refused  assisted living and stated he wanted to return home. On May 24,2007, the  patient slept well with his sleep medications.  He did admit at home he  usually gets up and smokes cigarettes or drinks caffeine when he wakes up at  night and therefore he cannot go back to sleep.  He began to talk about  wanting to go home I am tired of being caged up.  He stated he was going  to live with his mother who has schizophrenia.  He felt good about this  prospect and since he was feeling better he felt he could cope with his  mother's schizophrenia and delusions.  The patient had begun propranolol  first 10 mg p.o. b.i.d. and then increased to 20 mg p.o. b.i.d.  It appeared  to be helping his tremor somewhat though there were still EPS.  On Jan 05, 2006, mental status remained essentially the same. He continued to follow me  around asking when he can go home.  He wanted to know if I noticed that he  had slept well and showered and had his hair fixed. He was very anxious to  be discharged.  He still planned to go home and live with his mother and  felt back good about his prospect.   On Jan 06, 2006,  Artin is very excited about going home after the family  session. His mental status had improved markedly from admission status.  He  was friendly and cooperative and had good eye contact.  Speech halting, soft  and slow. Psychomotor was slowed but also  he had a restlessness due to the  tardive dyskinesia and EPS.  He had a tremor of his upper extremities in  would shift from 1 foot to another. The tremor of the upper extremities was  improved from admission due to the propranolol.  Mood was less anxious.  He  was not depressed.   Affect constricted but able to smile. There was no  suicidal or homicidal ideation.  No self injurious behavior.  No auditory or  visual hallucinations.  No paranoia or delusions.  Thoughts were logical and  goal-directed.  Thought content focused on wanting to go home.  Cognitive  exam was back to baseline.  The patient plans to return to live with his  mother for awhile.  He felt good about this and felt capable of handling her  paranoia due to her schizophrenia. He sees Dr. Archer Asa for follow-up  and plans to continue this.   DISCHARGE DIAGNOSES:  AXIS I: Schizoaffective disorder, depressed, without  psychotic features.  AXIS II: No diagnosis.  AXIS III: Mild tardive dyskinesia,  EPS, lithium tremors upper extremities.  AXIS IV: Severe (lives by himself and is very lonely, had minimal social  interactions).  AXIS V: Global assessment of functioning  upon discharge was 40, global assessment of functioning upon admission was 30, global  assessment of functioning highest past year was 50.   DISCHARGE/PLAN:  There were no specific activity level or dietary  restrictions.  DISCHARGE MEDICATIONS:  Haldol 5 mg t.i.d., Artane 2 mg p.o. b.i.d.,  diazepam 5 mg p.o. b.i.d., lithium carbonate tablet 300 mg 1 pill on the  morning and 2 pills at bedtime, propranolol 20 mg twice daily, Ambien 10 mg  1 pill at bedtime if needed for sleep.   POST HOSPITAL CARE PLANS:  The patient will return to see Dr. Archer Asa  on Thursday May 31 at 1:30 p.m.      Jasmine Pang, M.D.  Electronically Signed     B[facility]/MEDQ  D:  01/06/2006  T:  01/06/2006  Job:  [id number]

## 2011-04-06 ENCOUNTER — Ambulatory Visit: Payer: Self-pay | Admitting: Internal Medicine

## 2011-05-10 LAB — URINALYSIS, ROUTINE W REFLEX MICROSCOPIC
Bilirubin Urine: NEGATIVE
Glucose, UA: NEGATIVE
Hgb urine dipstick: NEGATIVE
Ketones, ur: NEGATIVE
Leukocytes, UA: NEGATIVE
Nitrite: NEGATIVE
Protein, ur: 30 — AB
Specific Gravity, Urine: 1.005
Urobilinogen, UA: 0.2
pH: 6.5

## 2011-05-10 LAB — BASIC METABOLIC PANEL
BUN: 17
CO2: 27
Calcium: 9.8
Chloride: 101
Creatinine, Ser: 1.41
GFR calc Af Amer: >60
GFR calc non Af Amer: 53 — ABNORMAL LOW
Glucose, Bld: 104 — ABNORMAL HIGH
Potassium: 3.8
Sodium: 138

## 2011-05-10 LAB — ETHANOL: Alcohol, Ethyl (B): <5

## 2011-05-10 LAB — CBC
HCT: 37.8 — ABNORMAL LOW
Hemoglobin: 12.9 — ABNORMAL LOW
MCHC: 34
MCV: 97.3
Platelets: 372
RBC: 3.89 — ABNORMAL LOW
RDW: 12.7
WBC: 9.5

## 2011-05-10 LAB — RAPID URINE DRUG SCREEN, HOSP PERFORMED
Amphetamines: NOT DETECTED
Barbiturates: NOT DETECTED
Benzodiazepines: POSITIVE — AB
Cocaine: NOT DETECTED
Opiates: NOT DETECTED
Tetrahydrocannabinol: NOT DETECTED

## 2011-05-10 LAB — DIFFERENTIAL
Basophils Absolute: 0
Basophils Relative: 0
Eosinophils Absolute: 0.2
Eosinophils Relative: 3
Lymphocytes Relative: 11 — ABNORMAL LOW
Lymphs Abs: 1.1
Monocytes Absolute: 0.8
Monocytes Relative: 9
Neutro Abs: 7.3
Neutrophils Relative %: 77

## 2011-05-10 LAB — LITHIUM LEVEL: Lithium Lvl: 0.67 — ABNORMAL LOW

## 2011-05-10 LAB — TSH: TSH: 0.204 — ABNORMAL LOW (ref 0.350–4.500)

## 2011-05-10 LAB — URINE MICROSCOPIC-ADD ON

## 2011-05-11 LAB — COMPREHENSIVE METABOLIC PANEL
Albumin: 3.3 — ABNORMAL LOW
BUN: 9
Creatinine, Ser: 1.24
Total Protein: 6.4

## 2011-05-11 LAB — DRUGS OF ABUSE SCREEN W/O ALC, ROUTINE URINE
Amphetamine Screen, Ur: NEGATIVE
Barbiturate Quant, Ur: NEGATIVE
Benzodiazepines.: NEGATIVE
Cocaine Metabolites: NEGATIVE
Creatinine,U: 23.9
Marijuana Metabolite: NEGATIVE
Methadone: NEGATIVE
Opiate Screen, Urine: NEGATIVE
Phencyclidine (PCP): NEGATIVE
Propoxyphene: NEGATIVE

## 2011-05-11 LAB — URINALYSIS, ROUTINE W REFLEX MICROSCOPIC
Bilirubin Urine: NEGATIVE
Glucose, UA: NEGATIVE
Hgb urine dipstick: NEGATIVE
Ketones, ur: NEGATIVE
Leukocytes, UA: NEGATIVE
Nitrite: NEGATIVE
Protein, ur: 30 — AB
Specific Gravity, Urine: 1.005
Urobilinogen, UA: 0.2
pH: 7

## 2011-05-11 LAB — CBC
HCT: 35.9 — ABNORMAL LOW
MCHC: 34.4
MCV: 98.3
Platelets: 335
RDW: 12.1

## 2011-05-16 LAB — ETHANOL: Alcohol, Ethyl (B): <5

## 2011-05-16 LAB — DIFFERENTIAL
Eosinophils Absolute: 0.2
Eosinophils Relative: 3
Lymphs Abs: 1.2
Monocytes Absolute: 0.4

## 2011-05-16 LAB — CBC
HCT: 39.6
Hemoglobin: 13.4
MCV: 97.7
Platelets: 393
WBC: 6.4

## 2011-05-16 LAB — LITHIUM LEVEL: Lithium Lvl: <0.25 — ABNORMAL LOW

## 2011-05-16 LAB — BASIC METABOLIC PANEL
BUN: 13
Chloride: 106
Glucose, Bld: 105 — ABNORMAL HIGH
Potassium: 3.2 — ABNORMAL LOW
Sodium: 142

## 2011-05-16 LAB — RAPID URINE DRUG SCREEN, HOSP PERFORMED
Barbiturates: NOT DETECTED
Benzodiazepines: NOT DETECTED
Cocaine: NOT DETECTED

## 2011-05-16 LAB — SALICYLATE LEVEL: Salicylate Lvl: <4

## 2011-08-27 ENCOUNTER — Encounter: Payer: Self-pay | Admitting: Internal Medicine

## 2011-08-27 DIAGNOSIS — Z0000 Encounter for general adult medical examination without abnormal findings: Secondary | ICD-10-CM | POA: Insufficient documentation

## 2011-08-27 DIAGNOSIS — R7302 Impaired glucose tolerance (oral): Secondary | ICD-10-CM | POA: Insufficient documentation

## 2011-08-27 HISTORY — DX: Impaired glucose tolerance (oral): R73.02

## 2011-08-31 ENCOUNTER — Encounter: Payer: Self-pay | Admitting: Internal Medicine

## 2011-08-31 ENCOUNTER — Ambulatory Visit (INDEPENDENT_AMBULATORY_CARE_PROVIDER_SITE_OTHER)
Admission: RE | Admit: 2011-08-31 | Discharge: 2011-08-31 | Disposition: A | Discharge status: Home / Self Care | Payer: Medicare Other | Source: Ambulatory Visit | Attending: Internal Medicine | Admitting: Internal Medicine

## 2011-08-31 ENCOUNTER — Other Ambulatory Visit (INDEPENDENT_AMBULATORY_CARE_PROVIDER_SITE_OTHER): Payer: Medicare Other

## 2011-08-31 ENCOUNTER — Ambulatory Visit (INDEPENDENT_AMBULATORY_CARE_PROVIDER_SITE_OTHER): Payer: Medicare Other | Admitting: Internal Medicine

## 2011-08-31 VITALS — BP 142/88 | HR 76 | Temp 98.4°F | Ht 68.5 in | Wt 151.0 lb

## 2011-08-31 DIAGNOSIS — R7309 Other abnormal glucose: Secondary | ICD-10-CM

## 2011-08-31 DIAGNOSIS — F319 Bipolar disorder, unspecified: Secondary | ICD-10-CM

## 2011-08-31 DIAGNOSIS — N320 Bladder-neck obstruction: Secondary | ICD-10-CM

## 2011-08-31 DIAGNOSIS — E782 Mixed hyperlipidemia: Secondary | ICD-10-CM

## 2011-08-31 DIAGNOSIS — R634 Abnormal weight loss: Secondary | ICD-10-CM

## 2011-08-31 DIAGNOSIS — R5381 Other malaise: Secondary | ICD-10-CM

## 2011-08-31 DIAGNOSIS — H6690 Otitis media, unspecified, unspecified ear: Secondary | ICD-10-CM

## 2011-08-31 DIAGNOSIS — H6691 Otitis media, unspecified, right ear: Secondary | ICD-10-CM | POA: Insufficient documentation

## 2011-08-31 DIAGNOSIS — R7302 Impaired glucose tolerance (oral): Secondary | ICD-10-CM

## 2011-08-31 LAB — BASIC METABOLIC PANEL
BUN: 17 mg/dL (ref 6–23)
CO2: 27 mEq/L (ref 19–32)
Calcium: 8.6 mg/dL (ref 8.4–10.5)
Chloride: 102 mEq/L (ref 96–112)
Creatinine, Ser: 1.2 mg/dL (ref 0.4–1.5)
Glucose, Bld: 82 mg/dL (ref 70–99)

## 2011-08-31 LAB — URINALYSIS, ROUTINE W REFLEX MICROSCOPIC
Nitrite: NEGATIVE
Specific Gravity, Urine: <=1.005 (ref 1.000–1.030)
Total Protein, Urine: 30
Urine Glucose: NEGATIVE
Urobilinogen, UA: 0.2 (ref 0.0–1.0)

## 2011-08-31 LAB — CBC WITH DIFFERENTIAL/PLATELET
Basophils Relative: 1.2 % (ref 0.0–3.0)
Eosinophils Absolute: 0.5 10*3/uL (ref 0.0–0.7)
Hemoglobin: 13.4 g/dL (ref 13.0–17.0)
Lymphocytes Relative: 20.3 % (ref 12.0–46.0)
Monocytes Relative: 8.2 % (ref 3.0–12.0)
Neutro Abs: 4.6 10*3/uL (ref 1.4–7.7)
Neutrophils Relative %: 63.2 % (ref 43.0–77.0)
RBC: 3.81 Mil/uL — ABNORMAL LOW (ref 4.22–5.81)
WBC: 7.4 10*3/uL (ref 4.5–10.5)

## 2011-08-31 LAB — HEPATIC FUNCTION PANEL
ALT: 15 U/L (ref 0–53)
Albumin: 3.4 g/dL — ABNORMAL LOW (ref 3.5–5.2)
Bilirubin, Direct: 0.1 mg/dL (ref 0.0–0.3)
Total Protein: 6.5 g/dL (ref 6.0–8.3)

## 2011-08-31 LAB — HEMOGLOBIN A1C: Hgb A1c MFr Bld: 5.7 % (ref 4.6–6.5)

## 2011-08-31 LAB — LIPID PANEL
HDL: 44.9 mg/dL (ref >39.00)
Total CHOL/HDL Ratio: 4
Triglycerides: 179 mg/dL — ABNORMAL HIGH (ref 0.0–149.0)

## 2011-08-31 MED ORDER — RANITIDINE HCL 300 MG PO TABS: 300.0000 mg | ORAL_TABLET | Freq: Every day | ORAL | Status: DC

## 2011-08-31 MED ORDER — SIMVASTATIN 40 MG PO TABS: 40.0000 mg | ORAL_TABLET | Freq: Every day | ORAL | Status: DC

## 2011-08-31 MED ORDER — DOXYCYCLINE HYCLATE 100 MG PO TABS: 100.0000 mg | ORAL_TABLET | Freq: Two times a day (BID) | ORAL | Status: AC

## 2011-08-31 NOTE — Assessment & Plan Note (Signed)
Also for PSA, asympt,  to f/u any worsening symptoms or concerns  

## 2011-08-31 NOTE — Assessment & Plan Note (Signed)
stable overall by hx and exam, most recent data reviewed with pt, and pt to continue medical treatment as before Lab Results  Component Value Date   LDLCALC 96 08/31/2011    

## 2011-08-31 NOTE — Assessment & Plan Note (Signed)
stable overall by hx and exam, most recent data reviewed with pt, and pt to continue medical treatment as before, to check depakote level

## 2011-08-31 NOTE — Patient Instructions (Addendum)
Take all new medications as prescribed - the antibiotic You can also take Mucinex (or it's generic off brand) for congestion Continue all other medications as before; I refilled the zantac and zocor Please have the pharmacy call if you need refills Please keep your appointments with your specialists as you have planned - psychiatry Please go to LAB in the Basement for the blood and/or urine tests to be done today Please go to XRAY in the Basement for the x-ray test Please call the phone number [phone] (the PhoneTree System) for results of testing in 2-3 days;  When calling, simply dial the number, and when prompted enter the MRN number above (the Medical Record Number) and the # key, then the message should start. Please stop smoking Please return in 1 year for your yearly visit, or sooner if needed

## 2011-08-31 NOTE — Assessment & Plan Note (Signed)
Mild to mod, for antibx course,  to f/u any worsening symptoms or concerns 

## 2011-08-31 NOTE — Assessment & Plan Note (Signed)
stable overall by hx and exam, most recent data reviewed with pt, and pt to continue medical treatment as before Lab Results  Component Value Date   HGBA1C 5.7 08/31/2011    

## 2011-08-31 NOTE — Assessment & Plan Note (Addendum)
?   Etiology, exam bening, cont's to smoke, for cxr today,  to f/u any worsening symptoms or concerns  Note:  Total time for pt hx, exam, review of record with pt in room, discussion of diagnoses and determination of plan for further eval and tx is > 40 min, with documentation > 50 min

## 2011-08-31 NOTE — Progress Notes (Signed)
Subjective:    Patient ID: Ruben Blackwell, male    DOB: 06-15-1956, 56 y.o.   MRN: [id number]  HPI  Here for f/u;  Overall doing ok;  Pt denies CP, worsening SOB, DOE, wheezing, orthopnea, PND, worsening LE edema, palpitations, dizziness or syncope.  Pt denies neurological change such as new Headache, facial or extremity weakness.  Pt denies polydipsia, polyuria, or low sugar symptoms. Pt states overall good compliance with treatment and medications, good tolerability, and trying to follow lower cholesterol diet.  Pt denies worsening depressive symptoms, suicidal ideation or panic. No fever, night sweats, loss of appetite, or other constitutional symptoms except has lost 25 lbs since 1 yr for unclear reasons.  Still smoking.   Pt states good ability with ADL's, low fall risk, home safety reviewed and adequate, no significant changes in hearing or vision, and occasionally active with exercise. Had rash to right forearm when made appt but now resolved without itch or pain.  No further abd pain he thinks may have been related to lithium, now that he is on depakote. No bedwetting for 3 yrs.  No ETOH for several yrs as well.  Incidentally with  Here with 3 days acute onset right ear pain, pressure, general weakness and malaise, with slight ST, but little to no cough.  Does have sense of ongoing fatigue, but denies signficant hypersomnolence.   Denies urinary symptoms such as dysuria, frequency, urgency,or hematuria.   Past Medical History  Diagnosis Date  . Impaired glucose tolerance 08/27/2011  . BIPOLAR DISORDER UNSPECIFIED 05/25/2009  . COPD 08/22/2010  . CORONARY ARTERY DISEASE 10/20/2009  . HYPERLIPIDEMIA 05/20/2007  . PARKINSON'S DISEASE 05/25/2009  . 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 TARDIVE DYSKINESIA 05/20/2007  . DEPRESSION 08/18/2008  . GERD 12/01/2008  . HYPERTHYROIDISM 08/18/2008   No past surgical history on file.  reports that he has quit smoking. He does not have any smokeless tobacco  history on file. He reports that he does not drink alcohol or use illicit drugs. family history is not on file. Allergies  Allergen Reactions  . Sulfonamide Derivatives     REACTION: Reaction not known   No current outpatient prescriptions on file prior to visit.   Review of Systems Review of Systems  Constitutional: Negative for diaphoresis and unexpected weight change.  HENT: Negative for drooling and tinnitus.   Eyes: Negative for photophobia and visual disturbance.  Respiratory: Negative for choking and stridor.   Gastrointestinal: Negative for vomiting and blood in stool.  Genitourinary: Negative for hematuria and decreased urine volume.       Objective:   Physical Exam BP 142/88  Pulse 76  Temp(Src) 98.4 F (36.9 C) (Oral)  Ht 5' 8.5" (1.74 m)  Wt 151 lb (68.493 kg)  BMI 22.63 kg/m2  SpO2 95% Physical Exam  VS noted, mild il Constitutional: Pt appears well-developed and well-nourished.  HENT: Head: Normocephalic.  Right Ear: External ear normal.  Left Ear: External ear normal.  Bilat tm's mod to severe erythema.  Sinus nontender.  Pharynx mild erythema Eyes: Conjunctivae and EOM are normal. Pupils are equal, round, and reactive to light.  Neck: Normal range of motion. Neck supple.  Cardiovascular: Normal rate and regular rhythm.   Pulmonary/Chest: Effort normal and breath sounds normal.  Abd:  Soft, NT, non-distended, + BS Neurological: Pt is alert. No cranial nerve deficit.  Skin: Skin is warm. No erythema.  Psychiatric: . Thought content normal. but often tangential and disorganized, 1+ nervous    Assessment &  Plan:

## 2011-08-31 NOTE — Assessment & Plan Note (Signed)
Etiology unclear, Exam otherwise benign, to check labs as documented, follow with expectant management  

## 2011-09-01 LAB — VALPROIC ACID LEVEL: Valproic Acid Lvl: 61.6 ug/mL (ref 50.0–100.0)

## 2012-06-05 ENCOUNTER — Encounter: Payer: Self-pay | Admitting: Internal Medicine

## 2012-06-05 ENCOUNTER — Ambulatory Visit (INDEPENDENT_AMBULATORY_CARE_PROVIDER_SITE_OTHER): Payer: Medicare Other | Admitting: Internal Medicine

## 2012-06-05 VITALS — BP 162/100 | HR 100 | Temp 97.2°F | Ht 68.5 in | Wt 172.0 lb

## 2012-06-05 DIAGNOSIS — J449 Chronic obstructive pulmonary disease, unspecified: Secondary | ICD-10-CM

## 2012-06-05 DIAGNOSIS — R7302 Impaired glucose tolerance (oral): Secondary | ICD-10-CM

## 2012-06-05 DIAGNOSIS — R7309 Other abnormal glucose: Secondary | ICD-10-CM

## 2012-06-05 DIAGNOSIS — E782 Mixed hyperlipidemia: Secondary | ICD-10-CM

## 2012-06-05 DIAGNOSIS — I10 Essential (primary) hypertension: Secondary | ICD-10-CM

## 2012-06-05 MED ORDER — BENZTROPINE MESYLATE 1 MG PO TABS: 0.5000 mg | ORAL_TABLET | Freq: Every day | ORAL | Status: DC

## 2012-06-05 MED ORDER — HALOPERIDOL 1 MG PO TABS: 1.0000 mg | ORAL_TABLET | Freq: Every day | ORAL | Status: DC

## 2012-06-05 MED ORDER — FLUOXETINE HCL 10 MG PO TABS: 10.0000 mg | ORAL_TABLET | Freq: Every day | ORAL | Status: DC

## 2012-06-05 MED ORDER — IRBESARTAN 150 MG PO TABS: 150.0000 mg | ORAL_TABLET | Freq: Every day | ORAL | Status: DC

## 2012-06-05 NOTE — Assessment & Plan Note (Signed)
stable overall by hx and exam, most recent data reviewed with pt, and pt to continue medical treatment as before Lab Results  Component Value Date   LDLCALC 96 08/31/2011

## 2012-06-05 NOTE — Assessment & Plan Note (Signed)
stable overall by hx and exam, most recent data reviewed with pt, and pt to continue medical treatment as before SpO2 Readings from Last 3 Encounters:  06/05/12 92%  08/31/11 95%  08/22/10 98%

## 2012-06-05 NOTE — Patient Instructions (Addendum)
Take all new medications as prescribed - the generic avapro 150 mg per day Continue all other medications as before Please keep your appointments with your specialists as you have planned Please check your blood pressure on a regular basis; your goal is to be less than 140/90

## 2012-06-05 NOTE — Progress Notes (Signed)
Subjective:    Patient ID: Ruben Blackwell, male    DOB: [birth year], 56 y.o.   MRN: [id number]  HPI  Here to f/u; overall doing ok,  Pt denies chest pain, increased sob or doe, wheezing, orthopnea, PND, increased LE swelling, palpitations, dizziness or syncope.  Pt denies new neurological symptoms such as new headache, or facial or extremity weakness or numbness   Pt denies polydipsia, polyuria, or low sugar symptoms such as weakness or confusion improved with po intake.  Pt states overall good compliance with meds, trying to follow lower cholesterol, diabetic diet, wt overall stable but little exercise however.  BP at psychiatry office similar to today last wk; still smokes quite a bit. Does have sense of ongoing fatigue, but denies signficant hypersomnolence.  Decliens flu shot today.  Has some trouble getting to sleep most nights, but admits to quite a bit of caffeine lately, and some recent wt gain.  Has been able to reduce meds recently with his current psychiatrist. Past Medical History  Diagnosis Date  . Impaired glucose tolerance 08/27/2011  . BIPOLAR DISORDER UNSPECIFIED 05/25/2009  . COPD 08/22/2010  . CORONARY ARTERY DISEASE 10/20/2009  . HYPERLIPIDEMIA 05/20/2007  . PARKINSON'S DISEASE 05/25/2009  . 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 TARDIVE DYSKINESIA 05/20/2007  . DEPRESSION 08/18/2008  . GERD 12/01/2008  . HYPERTHYROIDISM 08/18/2008   No past surgical history on file.  reports that he has quit smoking. He does not have any smokeless tobacco history on file. He reports that he does not drink alcohol or use illicit drugs. family history is not on file. Allergies  Allergen Reactions  . Sulfonamide Derivatives     REACTION: Reaction not known   Current Outpatient Prescriptions on File Prior to Visit  Medication Sig Dispense Refill  . divalproex (DEPAKOTE) 500 MG DR tablet 2 by mouth every evening      . ranitidine (ZANTAC) 300 MG tablet Take 1 tablet (300 mg total) by mouth at  bedtime.  90 tablet  3  . simvastatin (ZOCOR) 40 MG tablet Take 1 tablet (40 mg total) by mouth daily.  90 tablet  3  . diphenhydrAMINE (BENADRYL) 50 MG capsule       . FLUoxetine (PROZAC) 10 MG tablet Take 1 tablet (10 mg total) by mouth daily.  90 tablet  3  . irbesartan (AVAPRO) 150 MG tablet Take 1 tablet (150 mg total) by mouth at bedtime.  90 tablet  3   Review of Systems  Constitutional: Negative for diaphoresis and unexpected weight change.  HENT: Negative for tinnitus.   Eyes: Negative for photophobia and visual disturbance.  Respiratory: Negative for choking and stridor.   Gastrointestinal: Negative for vomiting and blood in stool.  Genitourinary: Negative for hematuria and decreased urine volume.  Musculoskeletal: Negative for gait problem.  Skin: Negative for color change and wound.  Neurological: Negative for tremors and numbness.  Psychiatric/Behavioral: Negative for decreased concentration. The patient is not hyperactive.       Objective:   Physical Exam BP 162/100  Pulse 100  Temp 97.2 F (36.2 C) (Oral)  Ht 5' 8.5" (1.74 m)  Wt 172 lb (78.019 kg)  BMI 25.77 kg/m2  SpO2 92% Physical Exam  VS noted Constitutional: Pt appears well-developed and well-nourished.  HENT: Head: Normocephalic.  Right Ear: External ear normal.  Left Ear: External ear normal.  Eyes: Conjunctivae and EOM are normal. Pupils are equal, round, and reactive to light.  Neck: Normal range of motion. Neck supple.  Cardiovascular: Normal rate and regular rhythm.   Pulmonary/Chest: Effort normal and breath sounds normal.  Abd:  Soft, NT, non-distended, + BS Neurological: Pt is alert. Not confused  Skin: Skin is warm. No erythema.  Psychiatric: Pt behavior is non agitated or paranoid    Assessment & Plan:

## 2012-06-05 NOTE — Assessment & Plan Note (Signed)
stable overall by hx and exam, most recent data reviewed with pt, and pt to continue medical treatment as before Lab Results  Component Value Date   HGBA1C 5.7 08/31/2011

## 2012-06-05 NOTE — Assessment & Plan Note (Signed)
elev today but overall stable overall by hx and exam, most recent data reviewed with pt, and pt to start avapro 150, suspect may need 300 mg but will need to start here BP Readings from Last 3 Encounters:  06/05/12 162/100  08/31/11 142/88  08/22/10 132/90

## 2012-06-06 ENCOUNTER — Telehealth: Payer: Self-pay

## 2012-06-06 MED ORDER — LISINOPRIL 20 MG PO TABS: 20.0000 mg | ORAL_TABLET | Freq: Every day | ORAL | Status: DC

## 2012-06-06 NOTE — Telephone Encounter (Signed)
Please offer alternative for BP med as was $200 for #90

## 2012-06-06 NOTE — Telephone Encounter (Signed)
Called left detailed msg. Of medication change

## 2012-06-06 NOTE — Telephone Encounter (Signed)
Called the patient and he does not have drug plan with medicare

## 2012-06-06 NOTE — Telephone Encounter (Signed)
Ok to change to lisinopril 20 - should be on the $4 list

## 2012-06-09 ENCOUNTER — Other Ambulatory Visit: Payer: Self-pay | Admitting: Internal Medicine

## 2012-06-09 MED ORDER — DIVALPROEX SODIUM 500 MG PO DR TAB: 500.0000 mg | DELAYED_RELEASE_TABLET | Freq: Two times a day (BID) | ORAL | Status: DC

## 2012-06-09 MED ORDER — FLUOXETINE HCL 20 MG PO CAPS: 20.0000 mg | ORAL_CAPSULE | Freq: Every day | ORAL | Status: DC

## 2012-09-01 ENCOUNTER — Ambulatory Visit (INDEPENDENT_AMBULATORY_CARE_PROVIDER_SITE_OTHER): Payer: Medicare Other | Admitting: Internal Medicine

## 2012-09-01 ENCOUNTER — Encounter: Payer: Self-pay | Admitting: Internal Medicine

## 2012-09-01 VITALS — BP 112/78 | HR 88 | Temp 98.0°F | Ht 68.0 in | Wt 173.0 lb

## 2012-09-01 DIAGNOSIS — R7302 Impaired glucose tolerance (oral): Secondary | ICD-10-CM

## 2012-09-01 DIAGNOSIS — R7309 Other abnormal glucose: Secondary | ICD-10-CM

## 2012-09-01 DIAGNOSIS — I10 Essential (primary) hypertension: Secondary | ICD-10-CM

## 2012-09-01 DIAGNOSIS — J40 Bronchitis, not specified as acute or chronic: Secondary | ICD-10-CM

## 2012-09-01 DIAGNOSIS — J449 Chronic obstructive pulmonary disease, unspecified: Secondary | ICD-10-CM

## 2012-09-01 MED ORDER — HYDROCODONE-HOMATROPINE 5-1.5 MG/5ML PO SYRP
5.0000 mL | ORAL_SOLUTION | Freq: Four times a day (QID) | ORAL | Status: DC | PRN
Start: 2012-09-01 — End: 2012-12-03

## 2012-09-01 MED ORDER — AZITHROMYCIN 250 MG PO TABS: ORAL_TABLET | ORAL | Status: DC

## 2012-09-01 NOTE — Assessment & Plan Note (Signed)
stable overall by history and exam, recent data reviewed with pt, and pt to continue medical treatment as before,  to f/u any worsening symptoms or concerns Lab Results  Component Value Date   HGBA1C 5.7 08/31/2011    

## 2012-09-01 NOTE — Assessment & Plan Note (Signed)
Mild to mod, for antibx course,  to f/u any worsening symptoms or concerns 

## 2012-09-01 NOTE — Assessment & Plan Note (Signed)
stable overall by history and exam, recent data reviewed with pt, and pt to continue medical treatment as before,  to f/u any worsening symptoms or concerns BP Readings from Last 3 Encounters:  09/01/12 112/78  06/05/12 162/100  08/31/11 142/88

## 2012-09-01 NOTE — Assessment & Plan Note (Signed)
stable overall by history and exam, recent data reviewed with pt, and pt to continue medical treatment as before,  to f/u any worsening symptoms or concerns SpO2 Readings from Last 3 Encounters:  09/01/12 96%  06/05/12 92%  08/31/11 95%

## 2012-09-01 NOTE — Progress Notes (Signed)
Subjective:    Patient ID: Ruben Blackwell, male    DOB: Oct 28, 1955, 57 y.o.   MRN: [id number]  HPI Here with acute onset mild to mod 2-3 days ST, HA, general weakness and malaise, with prod cough greenish sputum, but Pt denies chest pain, increased sob or doe, wheezing, orthopnea, PND, increased LE swelling, palpitations, dizziness or syncope.  Pt denies new neurological symptoms such as new headache, or facial or extremity weakness or numbness   Pt denies polydipsia, polyuria, Cont's to follow closely with psychiatry, cogentin recently stopped.  Denies worsening depressive symptoms, suicidal ideation, or panic; has ongoing anxie Past Medical History  Diagnosis Date  . Impaired glucose tolerance 08/27/2011  . BIPOLAR DISORDER UNSPECIFIED 05/25/2009  . COPD 08/22/2010  . CORONARY ARTERY DISEASE 10/20/2009  . HYPERLIPIDEMIA 05/20/2007  . PARKINSON'S DISEASE 05/25/2009  . 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 TARDIVE DYSKINESIA 05/20/2007  . DEPRESSION 08/18/2008  . GERD 12/01/2008  . HYPERTHYROIDISM 08/18/2008   No past surgical history on file.  reports that he has quit smoking. He does not have any smokeless tobacco history on file. He reports that he does not drink alcohol or use illicit drugs. family history is not on file. Allergies  Allergen Reactions  . Sulfonamide Derivatives     REACTION: Reaction not known   Current Outpatient Prescriptions on File Prior to Visit  Medication Sig Dispense Refill  . divalproex (DEPAKOTE) 500 MG DR tablet Take 1 tablet (500 mg total) by mouth 2 (two) times daily. 2 by mouth every evening  180 tablet  3  . FLUoxetine (PROZAC) 20 MG capsule Take 1 capsule (20 mg total) by mouth daily.  90 capsule  3  . haloperidol (HALDOL) 1 MG tablet Take 1 tablet (1 mg total) by mouth daily.  90 tablet  3  . lisinopril (PRINIVIL,ZESTRIL) 20 MG tablet Take 1 tablet (20 mg total) by mouth daily.  90 tablet  3  . ranitidine (ZANTAC) 300 MG tablet Take 1 tablet (300 mg  total) by mouth at bedtime.  90 tablet  3  . simvastatin (ZOCOR) 40 MG tablet Take 1 tablet (40 mg total) by mouth daily.  90 tablet  3   Review of Systems  Constitutional: Negative for unexpected weight change, or unusual diaphoresis  HENT: Negative for tinnitus.   Eyes: Negative for photophobia and visual disturbance.  Respiratory: Negative for choking and stridor.   Gastrointestinal: Negative for vomiting and blood in stool.  Genitourinary: Negative for hematuria and decreased urine volume.  Musculoskeletal: Negative for acute joint swelling Skin: Negative for color change and wound.  Neurological: Negative for tremors and numbness other than noted  Psychiatric/Behavioral: Negative for decreased concentration or  hyperactivity.       Objective:   Physical Exam BP 112/78  Pulse 88  Temp 98 F (36.7 C) (Oral)  Ht 5\' 8"  (1.727 m)  Wt 173 lb (78.472 kg)  BMI 26.30 kg/m2  SpO2 96% VS noted, mild ill Constitutional: Pt appears well-developed and well-nourished.  HENT: Head: NCAT.  Right Ear: External ear normal.  Left Ear: External ear normal.  Bilat tm's with mild erythema.  Max sinus areas mild tender.  Pharynx with mild erythema, no exudate Eyes: Conjunctivae and EOM are normal. Pupils are equal, round, and reactive to light.  Neck: Normal range of motion. Neck supple.  Cardiovascular: Normal rate and regular rhythm.   Pulmonary/Chest: Effort normal and breath sounds normal.  - no rales or wheezing Neurological: Pt is alert. Not  confused  Skin: Skin is warm. No erythema.  Psychiatric: Pt behavior is normal. Thought content normal. mild nervous, not depressed    Assessment & Plan:

## 2012-09-01 NOTE — Patient Instructions (Addendum)
Please take all new medication as prescribed Please continue all other medications as before, and refills have been done if requested. Please stop smoking See you next month as you have scheduled

## 2012-09-11 ENCOUNTER — Ambulatory Visit (INDEPENDENT_AMBULATORY_CARE_PROVIDER_SITE_OTHER): Payer: Medicare Other | Admitting: *Deleted

## 2012-09-11 DIAGNOSIS — Z23 Encounter for immunization: Secondary | ICD-10-CM

## 2012-10-09 ENCOUNTER — Encounter: Payer: Self-pay | Admitting: Internal Medicine

## 2012-10-09 ENCOUNTER — Ambulatory Visit (INDEPENDENT_AMBULATORY_CARE_PROVIDER_SITE_OTHER): Payer: Medicare Other | Admitting: Internal Medicine

## 2012-10-09 ENCOUNTER — Ambulatory Visit (INDEPENDENT_AMBULATORY_CARE_PROVIDER_SITE_OTHER)
Admission: RE | Admit: 2012-10-09 | Discharge: 2012-10-09 | Disposition: A | Discharge status: Home / Self Care | Payer: Medicare Other | Source: Ambulatory Visit | Attending: Internal Medicine | Admitting: Internal Medicine

## 2012-10-09 ENCOUNTER — Ambulatory Visit (INDEPENDENT_AMBULATORY_CARE_PROVIDER_SITE_OTHER): Payer: Medicare Other

## 2012-10-09 VITALS — BP 110/80 | HR 86 | Temp 98.4°F | Ht 68.5 in | Wt 170.4 lb

## 2012-10-09 DIAGNOSIS — E782 Mixed hyperlipidemia: Secondary | ICD-10-CM

## 2012-10-09 DIAGNOSIS — R05 Cough: Secondary | ICD-10-CM

## 2012-10-09 DIAGNOSIS — N320 Bladder-neck obstruction: Secondary | ICD-10-CM

## 2012-10-09 DIAGNOSIS — F259 Schizoaffective disorder, unspecified: Secondary | ICD-10-CM

## 2012-10-09 DIAGNOSIS — I10 Essential (primary) hypertension: Secondary | ICD-10-CM

## 2012-10-09 DIAGNOSIS — J42 Unspecified chronic bronchitis: Secondary | ICD-10-CM | POA: Insufficient documentation

## 2012-10-09 DIAGNOSIS — R059 Cough, unspecified: Secondary | ICD-10-CM

## 2012-10-09 HISTORY — DX: Unspecified chronic bronchitis: J42

## 2012-10-09 LAB — CBC WITH DIFFERENTIAL/PLATELET
Basophils Relative: 0.8 % (ref 0.0–3.0)
Eosinophils Relative: 9.8 % — ABNORMAL HIGH (ref 0.0–5.0)
HCT: 37.7 % — ABNORMAL LOW (ref 39.0–52.0)
Hemoglobin: 12.7 g/dL — ABNORMAL LOW (ref 13.0–17.0)
Lymphocytes Relative: 31.3 % (ref 12.0–46.0)
Lymphs Abs: 1.5 10*3/uL (ref 0.7–4.0)
Monocytes Relative: 11.7 % (ref 3.0–12.0)
Neutro Abs: 2.2 10*3/uL (ref 1.4–7.7)
RBC: 3.66 Mil/uL — ABNORMAL LOW (ref 4.22–5.81)
RDW: 13.8 % (ref 11.5–14.6)

## 2012-10-09 LAB — URINALYSIS, ROUTINE W REFLEX MICROSCOPIC
Ketones, ur: NEGATIVE
Specific Gravity, Urine: <=1.005 (ref 1.000–1.030)
Urine Glucose: NEGATIVE
Urobilinogen, UA: 0.2 (ref 0.0–1.0)
pH: 6 (ref 5.0–8.0)

## 2012-10-09 LAB — BASIC METABOLIC PANEL
CO2: 22 mEq/L (ref 19–32)
Chloride: 105 mEq/L (ref 96–112)
Potassium: 4.2 mEq/L (ref 3.5–5.1)
Sodium: 137 mEq/L (ref 135–145)

## 2012-10-09 LAB — HEPATIC FUNCTION PANEL
Albumin: 3.6 g/dL (ref 3.5–5.2)
Alkaline Phosphatase: 69 U/L (ref 39–117)
Total Protein: 7 g/dL (ref 6.0–8.3)

## 2012-10-09 LAB — LIPID PANEL: Total CHOL/HDL Ratio: 5

## 2012-10-09 LAB — PSA: PSA: 0.92 ng/mL (ref 0.10–4.00)

## 2012-10-09 MED ORDER — SIMVASTATIN 40 MG PO TABS: 40.0000 mg | ORAL_TABLET | Freq: Every day | ORAL | Status: DC

## 2012-10-09 NOTE — Patient Instructions (Addendum)
Please stop smoking Please continue all other medications as before, and refills have been done if requested (the simvastatin) Please have the pharmacy call with any other refills you may need. Please go to the LAB in the Basement (turn left off the elevator) for the tests to be done today You will be contacted by phone if any changes need to be made immediately.  Otherwise, you will receive a letter about your results with an explanation, but please check with MyChart first. Please call if you change your mind about the colonoscopy Please remember to sign up for My Chart if you have not done so, as this will be important to you in the future with finding out test results, communicating by private email, and scheduling acute appointments online when needed. Please return in 1 year for your yearly visit, or sooner if needed

## 2012-10-09 NOTE — Assessment & Plan Note (Signed)
Also for cxr today,  to f/u any worsening symptoms or concerns, encouraged to quit smoking

## 2012-10-09 NOTE — Assessment & Plan Note (Signed)
Also for psa as he is due,  to f/u any worsening symptoms or concerns  

## 2012-10-09 NOTE — Assessment & Plan Note (Addendum)
Cont psych f/u and tx, for valproic acid level   Note:  Total time for pt hx, exam, review of record with pt in the room, determination of diagnoses and plan for further eval and tx is > 40 min, with over 50% spent in coordination and counseling of patient

## 2012-10-09 NOTE — Progress Notes (Signed)
Subjective:    Patient ID: Ruben Blackwell, male    DOB: [birth year], 57 y.o.   MRN: [id number]  HPI  Here to f/u; overall doing ok,  Pt denies chest pain, increased sob or doe, wheezing, orthopnea, PND, increased LE swelling, palpitations, dizziness or syncope.  Pt denies polydipsia, polyuria, or low sugar symptoms such as weakness or confusion improved with po intake.  Pt denies new neurological symptoms such as new headache, or facial or extremity weakness or numbness.   Pt states overall good compliance with meds, has been trying to follow lower cholesterol diet, with wt overall stable,  but little exercise however.  Denies worsening depressive symptoms, suicidal ideation, or panic; has ongoing anxiety, not increased recently.  Cont's to smoke, has had increased mild non prod cough in the past wk  Past Medical History  Diagnosis Date  . Impaired glucose tolerance 08/27/2011  . BIPOLAR DISORDER UNSPECIFIED 05/25/2009  . COPD 08/22/2010  . CORONARY ARTERY DISEASE 10/20/2009  . HYPERLIPIDEMIA 05/20/2007  . PARKINSON'S DISEASE 05/25/2009  . 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 TARDIVE DYSKINESIA 05/20/2007  . DEPRESSION 08/18/2008  . GERD 12/01/2008  . HYPERTHYROIDISM 08/18/2008  . Unspecified chronic bronchitis 10/09/2012   No past surgical history on file.  reports that he has quit smoking. He does not have any smokeless tobacco history on file. He reports that he does not drink alcohol or use illicit drugs. family history is not on file. Allergies  Allergen Reactions  . Sulfonamide Derivatives     REACTION: Reaction not known   Current Outpatient Prescriptions on File Prior to Visit  Medication Sig Dispense Refill  . azithromycin (ZITHROMAX Z-PAK) 250 MG tablet Use as directed  6 each  1  . divalproex (DEPAKOTE) 500 MG DR tablet Take 1 tablet (500 mg total) by mouth 2 (two) times daily. 2 by mouth every evening  180 tablet  3  . FLUoxetine (PROZAC) 20 MG capsule Take 1 capsule (20 mg total)  by mouth daily.  90 capsule  3  . haloperidol (HALDOL) 1 MG tablet Take 1 tablet (1 mg total) by mouth daily.  90 tablet  3  . HYDROcodone-homatropine (HYCODAN) 5-1.5 MG/5ML syrup Take 5 mLs by mouth every 6 (six) hours as needed for cough.  120 mL  1  . lisinopril (PRINIVIL,ZESTRIL) 20 MG tablet Take 1 tablet (20 mg total) by mouth daily.  90 tablet  3  . ranitidine (ZANTAC) 300 MG tablet Take 1 tablet (300 mg total) by mouth at bedtime.  90 tablet  3   No current facility-administered medications on file prior to visit.   Review of Systems  Constitutional: Negative for unexpected weight change, or unusual diaphoresis  HENT: Negative for tinnitus.   Eyes: Negative for photophobia and visual disturbance.  Respiratory: Negative for choking and stridor.   Gastrointestinal: Negative for vomiting and blood in stool.  Genitourinary: Negative for hematuria and decreased urine volume.  Musculoskeletal: Negative for acute joint swelling Skin: Negative for color change and wound.  Neurological: Negative for tremors and numbness other than noted  Psychiatric/Behavioral: Negative for decreased concentration or  hyperactivity.       Objective:   Physical Exam BP 110/80  Pulse 86  Temp(Src) 98.4 F (36.9 C) (Oral)  Ht 5' 8.5" (1.74 m)  Wt 170 lb 6 oz (77.282 kg)  BMI 25.53 kg/m2  SpO2 97% VS noted,  Constitutional: Pt appears well-developed and well-nourished.  HENT: Head: NCAT.  Right Ear: External ear normal.  Left Ear: External ear normal.  Eyes: Conjunctivae and EOM are normal. Pupils are equal, round, and reactive to light.  Neck: Normal range of motion. Neck supple.  Cardiovascular: Normal rate and regular rhythm.   Pulmonary/Chest: Effort normal and breath sounds normal.  Abd:  Soft, NT, non-distended, + BS Neurological: Pt is alert. Not confused , TD movements noted, motor intact Skin: Skin is warm. No erythema.  Psychiatric: Pt behavior is normal. Not agitated    Assessment &  Plan:

## 2012-10-09 NOTE — Assessment & Plan Note (Signed)
ECG reviewed as per emr, stable overall by history and exam, recent data reviewed with pt, and pt to continue medical treatment as before,  to f/u any worsening symptoms or concerns BP Readings from Last 3 Encounters:  10/09/12 110/80  09/01/12 112/78  06/05/12 162/100

## 2012-10-09 NOTE — Assessment & Plan Note (Signed)
stable overall by history and exam, recent data reviewed with pt, and pt to continue medical treatment as before,  to f/u any worsening symptoms or concerns Lab Results  Component Value Date   LDLCALC 96 08/31/2011    

## 2012-10-09 NOTE — Assessment & Plan Note (Signed)
For cxr as above °

## 2012-10-10 ENCOUNTER — Encounter: Payer: Self-pay | Admitting: Internal Medicine

## 2012-10-10 LAB — VALPROIC ACID LEVEL: Valproic Acid Lvl: 96.8 ug/mL (ref 50.0–100.0)

## 2012-10-10 LAB — LDL CHOLESTEROL, DIRECT: Direct LDL: 122.4 mg/dL

## 2012-11-27 ENCOUNTER — Encounter: Payer: Self-pay | Admitting: Internal Medicine

## 2012-11-27 ENCOUNTER — Ambulatory Visit (INDEPENDENT_AMBULATORY_CARE_PROVIDER_SITE_OTHER): Payer: Medicare Other | Admitting: Internal Medicine

## 2012-11-27 VITALS — BP 110/72 | HR 95 | Temp 97.7°F | Ht 68.0 in | Wt 171.5 lb

## 2012-11-27 DIAGNOSIS — I10 Essential (primary) hypertension: Secondary | ICD-10-CM

## 2012-11-27 DIAGNOSIS — K219 Gastro-esophageal reflux disease without esophagitis: Secondary | ICD-10-CM

## 2012-11-27 DIAGNOSIS — E782 Mixed hyperlipidemia: Secondary | ICD-10-CM

## 2012-11-27 MED ORDER — PANTOPRAZOLE SODIUM 40 MG PO TBEC: 40.0000 mg | DELAYED_RELEASE_TABLET | Freq: Every day | ORAL | Status: DC

## 2012-11-27 NOTE — Assessment & Plan Note (Signed)
stable overall by history and exam, recent data reviewed with pt, and pt to continue medical treatment as before,  to f/u any worsening symptoms or concerns BP Readings from Last 3 Encounters:  11/27/12 110/72  10/09/12 110/80  09/01/12 112/78

## 2012-11-27 NOTE — Assessment & Plan Note (Signed)
For PPI trial, gave info on anti-reflux precautions, needs to work on wt loss and control

## 2012-11-27 NOTE — Progress Notes (Signed)
Subjective:    Patient ID: Ruben Blackwell, male    DOB: 1956/01/06, 57 y.o.   MRN: [id number]  HPI  Here to f/u, unfortunately has gained wt and admits to drinking mult regular sodas per day and greasy foods much more recently, now with worsening reflux symtpoms as well daily and not well controlled with otc.  Denies abd pain, dysphagia, n/v, bowel change or blood.   Pt denies fever, wt loss, night sweats, loss of appetite, or other constitutional symptoms  Pt denies chest pain, increased sob or doe, wheezing, orthopnea, PND, increased LE swelling, palpitations, dizziness or syncope.  Pt denies new neurological symptoms such as new headache, or facial or extremity weakness or numbness Past Medical History  Diagnosis Date  . Impaired glucose tolerance 08/27/2011  . BIPOLAR DISORDER UNSPECIFIED 05/25/2009  . COPD 08/22/2010  . CORONARY ARTERY DISEASE 10/20/2009  . HYPERLIPIDEMIA 05/20/2007  . PARKINSON'S DISEASE 05/25/2009  . 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 TARDIVE DYSKINESIA 05/20/2007  . DEPRESSION 08/18/2008  . GERD 12/01/2008  . HYPERTHYROIDISM 08/18/2008  . Unspecified chronic bronchitis 10/09/2012   No past surgical history on file.  reports that he has quit smoking. He does not have any smokeless tobacco history on file. He reports that he does not drink alcohol or use illicit drugs. family history is not on file. Allergies  Allergen Reactions  . Sulfonamide Derivatives     REACTION: Reaction not known   Current Outpatient Prescriptions on File Prior to Visit  Medication Sig Dispense Refill  . divalproex (DEPAKOTE) 500 MG DR tablet Take 1 tablet (500 mg total) by mouth 2 (two) times daily. 2 by mouth every evening  180 tablet  3  . FLUoxetine (PROZAC) 20 MG capsule Take 1 capsule (20 mg total) by mouth daily.  90 capsule  3  . haloperidol (HALDOL) 1 MG tablet Take 1 tablet (1 mg total) by mouth daily.  90 tablet  3  . HYDROcodone-homatropine (HYCODAN) 5-1.5 MG/5ML syrup Take 5 mLs  by mouth every 6 (six) hours as needed for cough.  120 mL  1  . lisinopril (PRINIVIL,ZESTRIL) 20 MG tablet Take 1 tablet (20 mg total) by mouth daily.  90 tablet  3  . ranitidine (ZANTAC) 300 MG tablet Take 1 tablet (300 mg total) by mouth at bedtime.  90 tablet  3  . simvastatin (ZOCOR) 40 MG tablet Take 1 tablet (40 mg total) by mouth daily.  90 tablet  3   No current facility-administered medications on file prior to visit.   Review of Systems  Constitutional: Negative for unexpected weight change, or unusual diaphoresis  HENT: Negative for tinnitus.   Eyes: Negative for photophobia and visual disturbance.  Respiratory: Negative for choking and stridor.   Gastrointestinal: Negative for vomiting and blood in stool.  Genitourinary: Negative for hematuria and decreased urine volume.  Musculoskeletal: Negative for acute joint swelling Skin: Negative for color change and wound.  Neurological: Negative for tremors and numbness other than noted  Psychiatric/Behavioral: Negative for decreased concentration or  hyperactivity.       Objective:   Physical Exam BP 110/72  Pulse 95  Temp(Src) 97.7 F (36.5 C) (Oral)  Ht 5\' 8"  (1.727 m)  Wt 171 lb 8 oz (77.792 kg)  BMI 26.08 kg/m2  SpO2 96% VS noted,  Constitutional: Pt appears well-developed and well-nourished.  HENT: Head: NCAT.  Right Ear: External ear normal.  Left Ear: External ear normal.  Eyes: Conjunctivae and EOM are normal. Pupils are  equal, round, and reactive to light.  Neck: Normal range of motion. Neck supple.  Cardiovascular: Normal rate and regular rhythm.   Pulmonary/Chest: Effort normal and breath sounds normal.  Abd:  Soft, NT, non-distended, + BS Neurological: Pt is alert. Not confused  Skin: Skin is warm. No erythema.  Psychiatric: Pt behavior is normal.    Assessment & Plan:

## 2012-11-27 NOTE — Assessment & Plan Note (Signed)
Also d/w pt, often worsens with dietary indiscretion,  Lab Results  Component Value Date   LDLCALC 96 08/31/2011   For cont'd low fat/chol diet,  to f/u any worsening symptoms or concerns With lab next visit/declines today

## 2012-11-27 NOTE — Patient Instructions (Signed)
Please take all new medication as prescribed (the generic protonix) after taking the nexium samples at 40 mg per day for 10 days Please continue all other medications as before, including the zantac at night. You are given the information regarding diet, weight loss today Please return if you are not improved in 3-5 days

## 2012-12-03 ENCOUNTER — Encounter: Payer: Self-pay | Admitting: Internal Medicine

## 2012-12-03 ENCOUNTER — Ambulatory Visit (INDEPENDENT_AMBULATORY_CARE_PROVIDER_SITE_OTHER): Payer: Medicare Other | Admitting: Internal Medicine

## 2012-12-03 VITALS — BP 102/78 | HR 92 | Temp 99.8°F | Ht 68.0 in | Wt 169.0 lb

## 2012-12-03 DIAGNOSIS — R197 Diarrhea, unspecified: Secondary | ICD-10-CM

## 2012-12-03 NOTE — Progress Notes (Signed)
Subjective:    Patient ID: Ruben Blackwell, male    DOB: 07/30/1956, 57 y.o.   MRN: [id number]  HPI  Pt presents to the clinic today with c/o diarrhea. This started 2 days ago. He has only had 2 BM's in the last 2 days. He is concerned because one of the stool was green. He has not had any abdominal pain. He has not had fever, chills or body aches. He denies blood in the stool. He has not taken anything OTC.  Review of Systems      Past Medical History  Diagnosis Date  . Impaired glucose tolerance 08/27/2011  . BIPOLAR DISORDER UNSPECIFIED 05/25/2009  . COPD 08/22/2010  . CORONARY ARTERY DISEASE 10/20/2009  . HYPERLIPIDEMIA 05/20/2007  . PARKINSON'S DISEASE 05/25/2009  . 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 TARDIVE DYSKINESIA 05/20/2007  . DEPRESSION 08/18/2008  . GERD 12/01/2008  . HYPERTHYROIDISM 08/18/2008  . Unspecified chronic bronchitis 10/09/2012    Current Outpatient Prescriptions  Medication Sig Dispense Refill  . divalproex (DEPAKOTE) 500 MG DR tablet Take 1 tablet (500 mg total) by mouth 2 (two) times daily. 2 by mouth every evening  180 tablet  3  . FLUoxetine (PROZAC) 20 MG capsule Take 1 capsule (20 mg total) by mouth daily.  90 capsule  3  . haloperidol (HALDOL) 1 MG tablet Take 1 tablet (1 mg total) by mouth daily.  90 tablet  3  . HYDROcodone-homatropine (HYCODAN) 5-1.5 MG/5ML syrup Take 5 mLs by mouth every 6 (six) hours as needed for cough.  120 mL  1  . lisinopril (PRINIVIL,ZESTRIL) 20 MG tablet Take 1 tablet (20 mg total) by mouth daily.  90 tablet  3  . pantoprazole (PROTONIX) 40 MG tablet Take 1 tablet (40 mg total) by mouth daily.  90 tablet  3  . ranitidine (ZANTAC) 300 MG tablet Take 1 tablet (300 mg total) by mouth at bedtime.  90 tablet  3  . simvastatin (ZOCOR) 40 MG tablet Take 1 tablet (40 mg total) by mouth daily.  90 tablet  3   No current facility-administered medications for this visit.    Allergies  Allergen Reactions  . Sulfonamide Derivatives      REACTION: Reaction not known    No family history on file.  History   Social History  . Marital Status: Divorced    Spouse Name: N/A    Number of Children: N/A  . Years of Education: N/A   Occupational History  . Not on file.   Social History Main Topics  . Smoking status: Former Games developer  . Smokeless tobacco: Not on file  . Alcohol Use: No  . Drug Use: No  . Sexually Active: Not on file   Other Topics Concern  . Not on file   Social History Narrative  . No narrative on file     Constitutional: Denies fever, malaise, fatigue, headache or abrupt weight changes.  Gastrointestinal: Pt reports diarrhea. Denies abdominal pain, bloating, constipation, or blood in the stool.   No other specific complaints in a complete review of systems (except as listed in HPI above).  Objective:   Physical Exam  BP 102/78  Pulse 92  Temp(Src) 99.8 F (37.7 C) (Oral)  Ht 5\' 8"  (1.727 m)  Wt 169 lb (76.658 kg)  BMI 25.7 kg/m2  SpO2 95% Wt Readings from Last 3 Encounters:  12/03/12 169 lb (76.658 kg)  11/27/12 171 lb 8 oz (77.792 kg)  10/09/12 170 lb 6 oz (77.282 kg)  General: Appears his stated age, well developed, well nourished in NAD.  Cardiovascular: Normal rate and rhythm. S1,S2 noted.  No murmur, rubs or gallops noted. No JVD or BLE edema. No carotid bruits noted. Pulmonary/Chest: Normal effort and positive vesicular breath sounds. No respiratory distress. No wheezes, rales or ronchi noted.  Abdomen: Soft and nontender. Normal bowel sounds, no bruits noted. No distention or masses noted. Liver, spleen and kidneys non palpable.        Assessment & Plan:   Diarrhea, of unknown origin:  Increase your fluid intake Cut down on your sodas Monitor your stools for blood Reassurance given that this should resolve

## 2012-12-03 NOTE — Patient Instructions (Signed)
Diarrhea Infections caused by germs (bacterial) or a virus commonly cause diarrhea. Your caregiver has determined that with time, rest and fluids, the diarrhea should improve. In general, eat normally while drinking more water than usual. Although water may prevent dehydration, it does not contain salt and minerals (electrolytes). Broths, weak tea without caffeine and oral rehydration solutions (ORS) replace fluids and electrolytes. Small amounts of fluids should be taken frequently. Large amounts at one time may not be tolerated. Plain water may be harmful in infants and the elderly. Oral rehydrating solutions (ORS) are available at pharmacies and grocery stores. ORS replace water and important electrolytes in proper proportions. Sports drinks are not as effective as ORS and may be harmful due to sugars worsening diarrhea.  ORS is especially recommended for use in children with diarrhea. As a general guideline for children, replace any new fluid losses from diarrhea and/or vomiting with ORS as follows:  If your child weighs 22 pounds or under (10 kg or less), give 60-120 mL ( -  cup or 2 - 4 ounces) of ORS for each episode of diarrheal stool or vomiting episode.  If your child weighs more than 22 pounds (more than 10 kgs), give 120-240 mL ( - 1 cup or 4 - 8 ounces) of ORS for each diarrheal stool or episode of vomiting.  While correcting for dehydration, children should eat normally. However, foods high in sugar should be avoided because this may worsen diarrhea. Large amounts of carbonated soft drinks, juice, gelatin desserts and other highly sugared drinks should be avoided.  After correction of dehydration, other liquids that are appealing to the child may be added. Children should drink small amounts of fluids frequently and fluids should be increased as tolerated. Children should drink enough fluids to keep urine clear or pale yellow.  Adults should eat normally while drinking more fluids than  usual. Drink small amounts of fluids frequently and increase as tolerated. Drink enough fluids to keep urine clear or pale yellow. Broths, weak decaffeinated tea, lemon lime soft drinks (allowed to go flat) and ORS replace fluids and electrolytes.  Avoid:  Carbonated drinks.  Juice.  Extremely hot or cold fluids.  Caffeine drinks.  Fatty, greasy foods.  Alcohol.  Tobacco.  Too much intake of anything at one time.  Gelatin desserts.  Probiotics are active cultures of beneficial bacteria. They may lessen the amount and number of diarrheal stools in adults. Probiotics can be found in yogurt with active cultures and in supplements.  Wash hands well to avoid spreading bacteria and virus.  Anti-diarrheal medications are not recommended for infants and children.  Only take over-the-counter or prescription medicines for pain, discomfort or fever as directed by your caregiver. Do not give aspirin to children because it may cause Reye's Syndrome.  For adults, ask your caregiver if you should continue all prescribed and over-the-counter medicines.  If your caregiver has given you a follow-up appointment, it is very important to keep that appointment. Not keeping the appointment could result in a chronic or permanent injury, and disability. If there is any problem keeping the appointment, you must call back to this facility for assistance. SEEK IMMEDIATE MEDICAL CARE IF:   You or your child is unable to keep fluids down or other symptoms or problems become worse in spite of treatment.  Vomiting or diarrhea develops and becomes persistent.  There is vomiting of blood or bile (green material).  There is blood in the stool or the stools are black and   tarry.  There is no urine output in 6-8 hours or there is only a small amount of very dark urine.  Abdominal pain develops, increases or localizes.  You have a fever.  Your baby is older than 3 months with a rectal temperature of 102 F  (38.9 C) or higher.  Your baby is 3 months old or younger with a rectal temperature of 100.4 F (38 C) or higher.  You or your child develops excessive weakness, dizziness, fainting or extreme thirst.  You or your child develops a rash, stiff neck, severe headache or become irritable or sleepy and difficult to awaken. MAKE SURE YOU:   Understand these instructions.  Will watch your condition.  Will get help right away if you are not doing well or get worse. Document Released: 07/20/2002 Document Revised: 10/22/2011 Document Reviewed: 06/06/2009 [employer] Patient Information 2013 [employer].  

## 2012-12-16 ENCOUNTER — Other Ambulatory Visit: Payer: Self-pay | Admitting: Internal Medicine

## 2012-12-22 ENCOUNTER — Encounter: Payer: Self-pay | Admitting: Internal Medicine

## 2012-12-22 ENCOUNTER — Ambulatory Visit (INDEPENDENT_AMBULATORY_CARE_PROVIDER_SITE_OTHER): Payer: Medicare Other | Admitting: Internal Medicine

## 2012-12-22 VITALS — BP 110/76 | HR 95 | Temp 99.3°F | Ht 68.0 in | Wt 168.0 lb

## 2012-12-22 DIAGNOSIS — R350 Frequency of micturition: Secondary | ICD-10-CM

## 2012-12-22 DIAGNOSIS — R3915 Urgency of urination: Secondary | ICD-10-CM

## 2012-12-22 DIAGNOSIS — N3010 Interstitial cystitis (chronic) without hematuria: Secondary | ICD-10-CM

## 2012-12-22 LAB — POCT URINALYSIS DIPSTICK
Blood, UA: NEGATIVE
Glucose, UA: NEGATIVE
Nitrite, UA: NEGATIVE
Spec Grav, UA: <=1.005
Urobilinogen, UA: 0.2
pH, UA: 6

## 2012-12-22 NOTE — Patient Instructions (Signed)

## 2012-12-22 NOTE — Progress Notes (Signed)
HPI  Pt presents to the clinic today with c/o urinary urgency and frequency. This started 2 weeks ago. He sometimes has a hard time making it to the bathroom. He has not seen any blood in his urine. He denies fever, chills, nausea, vomiting, pain with urination or burning at the tip of the penis or back pain. He does report history of a prostate infection back in 1987. He has increased his fluid intake but has not helped with his symptoms. He denies penile discharge.   Review of Systems  Past Medical History  Diagnosis Date  . Impaired glucose tolerance 08/27/2011  . BIPOLAR DISORDER UNSPECIFIED 05/25/2009  . COPD 08/22/2010  . CORONARY ARTERY DISEASE 10/20/2009  . HYPERLIPIDEMIA 05/20/2007  . PARKINSON'S DISEASE 05/25/2009  . 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 TARDIVE DYSKINESIA 05/20/2007  . DEPRESSION 08/18/2008  . GERD 12/01/2008  . HYPERTHYROIDISM 08/18/2008  . Unspecified chronic bronchitis 10/09/2012    History reviewed. No pertinent family history.  History   Social History  . Marital Status: Divorced    Spouse Name: N/A    Number of Children: N/A  . Years of Education: N/A   Occupational History  . Not on file.   Social History Main Topics  . Smoking status: Former Games developer  . Smokeless tobacco: Not on file  . Alcohol Use: No  . Drug Use: No  . Sexually Active: Not on file   Other Topics Concern  . Not on file   Social History Narrative  . No narrative on file    Allergies  Allergen Reactions  . Sulfonamide Derivatives     REACTION: Reaction not known    Constitutional: Denies fever, malaise, fatigue, headache or abrupt weight changes.   GU: Pt reports urgency, frequency. Denies burning sensation, pain with urination,  blood in urine, odor or discharge. Skin: Denies redness, rashes, lesions or ulcercations.   No other specific complaints in a complete review of systems (except as listed in HPI above).    Objective:   Physical Exam  BP 110/76  Pulse 95   Temp(Src) 99.3 F (37.4 C) (Oral)  Ht 5\' 8"  (1.727 m)  Wt 168 lb (76.204 kg)  BMI 25.55 kg/m2  SpO2 95% Wt Readings from Last 3 Encounters:  12/22/12 168 lb (76.204 kg)  12/03/12 169 lb (76.658 kg)  11/27/12 171 lb 8 oz (77.792 kg)    General: Appears his stated age, well developed, well nourished in NAD. Cardiovascular: Normal rate and rhythm. S1,S2 noted.  No murmur, rubs or gallops noted. No JVD or BLE edema. No carotid bruits noted. Pulmonary/Chest: Normal effort and positive vesicular breath sounds. No respiratory distress. No wheezes, rales or ronchi noted.  Abdomen: Soft and nontender. Normal bowel sounds, no bruits noted. No distention or masses noted. Liver, spleen and kidneys non palpable. Tender to palpation over the bladder area. No CVA tenderness.      Assessment & Plan:   Urinary urgency and frequency, likely secondary to cystitis, new oneset:  Will obtain urinalysis and urine culture Drink plenty of fluids No need for antibiotics at this time  RTC as needed or if symptoms persist.

## 2013-03-06 ENCOUNTER — Other Ambulatory Visit: Payer: Self-pay | Admitting: Internal Medicine

## 2013-04-30 ENCOUNTER — Other Ambulatory Visit: Payer: Self-pay | Admitting: Internal Medicine

## 2013-04-30 ENCOUNTER — Other Ambulatory Visit (INDEPENDENT_AMBULATORY_CARE_PROVIDER_SITE_OTHER): Payer: Medicare Other

## 2013-04-30 ENCOUNTER — Ambulatory Visit (INDEPENDENT_AMBULATORY_CARE_PROVIDER_SITE_OTHER): Payer: Medicare Other | Admitting: Internal Medicine

## 2013-04-30 VITALS — BP 120/82 | HR 86 | Temp 98.7°F | Ht 68.5 in | Wt 170.2 lb

## 2013-04-30 DIAGNOSIS — N289 Disorder of kidney and ureter, unspecified: Secondary | ICD-10-CM

## 2013-04-30 DIAGNOSIS — K219 Gastro-esophageal reflux disease without esophagitis: Secondary | ICD-10-CM

## 2013-04-30 DIAGNOSIS — R109 Unspecified abdominal pain: Secondary | ICD-10-CM

## 2013-04-30 DIAGNOSIS — R7309 Other abnormal glucose: Secondary | ICD-10-CM

## 2013-04-30 DIAGNOSIS — R112 Nausea with vomiting, unspecified: Secondary | ICD-10-CM

## 2013-04-30 DIAGNOSIS — I10 Essential (primary) hypertension: Secondary | ICD-10-CM

## 2013-04-30 DIAGNOSIS — R7302 Impaired glucose tolerance (oral): Secondary | ICD-10-CM

## 2013-04-30 LAB — CBC WITH DIFFERENTIAL/PLATELET
Basophils Relative: 0.9 % (ref 0.0–3.0)
Eosinophils Relative: 5.2 % — ABNORMAL HIGH (ref 0.0–5.0)
HCT: 36 % — ABNORMAL LOW (ref 39.0–52.0)
Hemoglobin: 12.3 g/dL — ABNORMAL LOW (ref 13.0–17.0)
Lymphs Abs: 1.5 10*3/uL (ref 0.7–4.0)
MCV: 100.8 fl — ABNORMAL HIGH (ref 78.0–100.0)
Monocytes Absolute: 0.7 10*3/uL (ref 0.1–1.0)
Monocytes Relative: 13 % — ABNORMAL HIGH (ref 3.0–12.0)
Neutro Abs: 3.2 10*3/uL (ref 1.4–7.7)
Platelets: 300 10*3/uL (ref 150.0–400.0)
RBC: 3.57 Mil/uL — ABNORMAL LOW (ref 4.22–5.81)
WBC: 5.7 10*3/uL (ref 4.5–10.5)

## 2013-04-30 LAB — HEPATIC FUNCTION PANEL
AST: 16 U/L (ref 0–37)
Albumin: 3.5 g/dL (ref 3.5–5.2)
Total Bilirubin: 0.5 mg/dL (ref 0.3–1.2)

## 2013-04-30 LAB — BASIC METABOLIC PANEL
BUN: 12 mg/dL (ref 6–23)
Calcium: 8.8 mg/dL (ref 8.4–10.5)
GFR: 32.15 mL/min — ABNORMAL LOW (ref >60.00)
Glucose, Bld: 71 mg/dL (ref 70–99)
Potassium: 3.6 mEq/L (ref 3.5–5.1)

## 2013-04-30 LAB — LIPASE: Lipase: 34 U/L (ref 11.0–59.0)

## 2013-04-30 MED ORDER — ONDANSETRON HCL 4 MG PO TABS: 4.0000 mg | ORAL_TABLET | Freq: Three times a day (TID) | ORAL | Status: DC | PRN

## 2013-04-30 NOTE — Patient Instructions (Signed)
Please take all new medication as prescribed - the nausea medication You will be contacted regarding the referral for: Dr Brennan Bailey Please go to the LAB in the Basement (turn left off the elevator) for the tests to be done today You will be contacted by phone if any changes need to be made immediately.  Otherwise, you will receive a letter about your results with an explanation, but please check with MyChart first.  Please remember to sign up for My Chart if you have not done so, as this will be important to you in the future with finding out test results, communicating by private email, and scheduling acute appointments online when needed.  Please return in 6 months, or sooner if needed

## 2013-04-30 NOTE — Progress Notes (Signed)
Subjective:    Patient ID: Ruben Blackwell, male    DOB: 1956-06-04, 57 y.o.   MRN: [id number]  HPI  Here to f/u; hx is difficult but c/o 1-2 wks worsening nausea, with occas vomiting, without fever, st, cough and Pt denies chest pain, increased sob or doe, wheezing, orthopnea, PND, increased LE swelling, palpitations, dizziness or syncope.  Tends to drink sugar colas during the day, has gained several lbs.  Not clear, but seems solids may be the problem since it is usually more solid food that vomits.  Denies worsening reflux, bowel change or blood.   Pt denies polydipsia, polyuria, Pt states overall good compliance with meds     Past Medical History  Diagnosis Date  . Impaired glucose tolerance 08/27/2011  . BIPOLAR DISORDER UNSPECIFIED 05/25/2009  . COPD 08/22/2010  . CORONARY ARTERY DISEASE 10/20/2009  . HYPERLIPIDEMIA 05/20/2007  . PARKINSON'S DISEASE 05/25/2009  . 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 TARDIVE DYSKINESIA 05/20/2007  . DEPRESSION 08/18/2008  . GERD 12/01/2008  . HYPERTHYROIDISM 08/18/2008  . Unspecified chronic bronchitis 10/09/2012   No past surgical history on file.  reports that he has quit smoking. He does not have any smokeless tobacco history on file. He reports that he does not drink alcohol or use illicit drugs. family history is not on file. Allergies  Allergen Reactions  . Sulfonamide Derivatives     REACTION: Reaction not known   Current Outpatient Prescriptions on File Prior to Visit  Medication Sig Dispense Refill  . divalproex (DEPAKOTE) 500 MG DR tablet Take 1 tablet (500 mg total) by mouth 2 (two) times daily. 2 by mouth every evening  180 tablet  3  . FLUoxetine (PROZAC) 20 MG capsule Take 1 capsule (20 mg total) by mouth daily.  90 capsule  3  . pantoprazole (PROTONIX) 40 MG tablet Take 1 tablet (40 mg total) by mouth daily.  90 tablet  3  . ranitidine (ZANTAC) 300 MG tablet TAKE ONE TABLET BY MOUTH ONCE DAILY AT BEDTIME  90 tablet  0  . simvastatin  (ZOCOR) 40 MG tablet Take 1 tablet (40 mg total) by mouth daily.  90 tablet  3   No current facility-administered medications on file prior to visit.   Review of Systems  Constitutional: Negative for unexpected weight change, or unusual diaphoresis  HENT: Negative for tinnitus.   Eyes: Negative for photophobia and visual disturbance.  Respiratory: Negative for choking and stridor.   Gastrointestinal: Negative for vomiting and blood in stool.  Genitourinary: Negative for hematuria and decreased urine volume.  Musculoskeletal: Negative for acute joint swelling Skin: Negative for color change and wound.  Neurological: Negative for tremors and numbness other than noted  Psychiatric/Behavioral: Negative for decreased concentration or  hyperactivity.       Objective:   Physical Exam BP 120/82  Pulse 86  Temp(Src) 98.7 F (37.1 C) (Oral)  Ht 5' 8.5" (1.74 m)  Wt 170 lb 4 oz (77.225 kg)  BMI 25.51 kg/m2  SpO2 97% VS noted, not ill appearing Constitutional: Pt appears well-developed and well-nourished.  HENT: Head: NCAT.  Right Ear: External ear normal.  Left Ear: External ear normal.  Eyes: Conjunctivae and EOM are normal. Pupils are equal, round, and reactive to light.  Neck: Normal range of motion. Neck supple.  Cardiovascular: Normal rate and regular rhythm.   Pulmonary/Chest: Effort normal and breath sounds normal. - no rales or wheezing  Abd:  Soft, NT, non-distended, + BS Neurological: Pt is alert. Skin:  Skin is warm. No erythema.  Psychiatric: Pt behavior is normal.    Assessment & Plan:

## 2013-05-01 LAB — VALPROIC ACID LEVEL: Valproic Acid Lvl: 74 ug/mL (ref 50.0–100.0)

## 2013-05-04 ENCOUNTER — Other Ambulatory Visit (INDEPENDENT_AMBULATORY_CARE_PROVIDER_SITE_OTHER): Payer: Medicare Other

## 2013-05-04 ENCOUNTER — Encounter: Payer: Self-pay | Admitting: Internal Medicine

## 2013-05-04 ENCOUNTER — Telehealth: Payer: Self-pay | Admitting: Internal Medicine

## 2013-05-04 DIAGNOSIS — N289 Disorder of kidney and ureter, unspecified: Secondary | ICD-10-CM

## 2013-05-04 LAB — URINALYSIS, ROUTINE W REFLEX MICROSCOPIC
Bilirubin Urine: NEGATIVE
Ketones, ur: NEGATIVE
Leukocytes, UA: NEGATIVE
Specific Gravity, Urine: <=1.005 (ref 1.000–1.030)
Urobilinogen, UA: 0.2 (ref 0.0–1.0)

## 2013-05-04 LAB — BASIC METABOLIC PANEL
CO2: 24 mEq/L (ref 19–32)
Chloride: 101 mEq/L (ref 96–112)
Creatinine, Ser: 2.2 mg/dL — ABNORMAL HIGH (ref 0.4–1.5)
Potassium: 3.3 mEq/L — ABNORMAL LOW (ref 3.5–5.1)

## 2013-05-04 NOTE — Assessment & Plan Note (Signed)
stable overall by history and exam, and pt to continue medical treatment as before,  to f/u any worsening symptoms or concerns 

## 2013-05-04 NOTE — Assessment & Plan Note (Signed)
stable overall by history and exam, recent data reviewed with pt, and pt to continue medical treatment as before,  to f/u any worsening symptoms or concerns Lab Results  Component Value Date   HGBA1C 5.7 08/31/2011    

## 2013-05-04 NOTE — Assessment & Plan Note (Signed)
stable overall by history and exam, recent data reviewed with pt, and pt to continue medical treatment as before,  to f/u any worsening symptoms or concerns BP Readings from Last 3 Encounters:  04/30/13 120/82  12/22/12 110/76  12/03/12 102/78

## 2013-05-04 NOTE — Assessment & Plan Note (Signed)
Etiology unclear, for labs today, zofran prn, and refer GI - Dr Leone Payor

## 2013-05-04 NOTE — Telephone Encounter (Signed)
Pt thinks Zella Ball called him.  Drank milk and had nausea after.  He took the nausea medicine for it.  He is coming today for labs.  Aware Dr. Jonny Ruiz and Zella Ball are off today.

## 2013-05-05 ENCOUNTER — Encounter: Payer: Self-pay | Admitting: Internal Medicine

## 2013-05-05 ENCOUNTER — Ambulatory Visit (INDEPENDENT_AMBULATORY_CARE_PROVIDER_SITE_OTHER): Payer: Medicare Other | Admitting: Internal Medicine

## 2013-05-05 ENCOUNTER — Ambulatory Visit
Admission: RE | Admit: 2013-05-05 | Discharge: 2013-05-05 | Disposition: A | Discharge status: Home / Self Care | Payer: Medicare Other | Source: Ambulatory Visit | Attending: Internal Medicine | Admitting: Internal Medicine

## 2013-05-05 VITALS — BP 142/98 | HR 72 | Temp 99.9°F | Resp 16 | Wt 171.0 lb

## 2013-05-05 DIAGNOSIS — F17200 Nicotine dependence, unspecified, uncomplicated: Secondary | ICD-10-CM

## 2013-05-05 DIAGNOSIS — N289 Disorder of kidney and ureter, unspecified: Secondary | ICD-10-CM

## 2013-05-05 DIAGNOSIS — R112 Nausea with vomiting, unspecified: Secondary | ICD-10-CM

## 2013-05-05 DIAGNOSIS — R109 Unspecified abdominal pain: Secondary | ICD-10-CM

## 2013-05-05 NOTE — Patient Instructions (Signed)
Cut back on Pepsi Hold Simvastatin until you see Dr Jonny Ruiz Use Zofran as needed for nausea Smoke less!

## 2013-05-05 NOTE — Assessment & Plan Note (Addendum)
Fall 2014 - poss related to Pepsi and smoking induced gastritis, fatty liver  (1 gal of Pepsi a day!!!)   Cut back on Pepsi and smoking  Hold Simvastatin until you see Dr Jonny Ruiz Zofran as needed for nausea

## 2013-05-05 NOTE — Progress Notes (Signed)
Subjective:    HPI  Here to f/u; hx is difficult but c/o 6-8 wks worsening nausea, with occas vomiting, without fever, st, cough and Pt denies chest pain, increased sob or doe, wheezing, orthopnea, PND, increased LE swelling, palpitations, dizziness or syncope.  Tends to drink sugar colas (1 gal of Pepsi a day!!!) during the day, has gained several lbs. 2-3 ppd smoker. Not clear, but seems solids may be the problem since it is usually more solid food that vomits.  Denies worsening reflux, bowel change or blood.    Pt states overall good compliance with meds     Wt Readings from Last 3 Encounters:  05/05/13 171 lb (77.565 kg)  04/30/13 170 lb 4 oz (77.225 kg)  12/22/12 168 lb (76.204 kg)   BP Readings from Last 3 Encounters:  05/05/13 142/98  04/30/13 120/82  12/22/12 110/76    Past Medical History  Diagnosis Date  . Impaired glucose tolerance 08/27/2011  . BIPOLAR DISORDER UNSPECIFIED 05/25/2009  . COPD 08/22/2010  . CORONARY ARTERY DISEASE 10/20/2009  . HYPERLIPIDEMIA 05/20/2007  . PARKINSON'S DISEASE 05/25/2009  . 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 TARDIVE DYSKINESIA 05/20/2007  . DEPRESSION 08/18/2008  . GERD 12/01/2008  . HYPERTHYROIDISM 08/18/2008  . Unspecified chronic bronchitis 10/09/2012   History reviewed. No pertinent past surgical history.  reports that he has quit smoking. He does not have any smokeless tobacco history on file. He reports that he does not drink alcohol or use illicit drugs. family history is not on file. Allergies  Allergen Reactions  . Sulfonamide Derivatives     REACTION: Reaction not known   Current Outpatient Prescriptions on File Prior to Visit  Medication Sig Dispense Refill  . divalproex (DEPAKOTE) 500 MG DR tablet Take 1 tablet (500 mg total) by mouth 2 (two) times daily. 2 by mouth every evening  180 tablet  3  . FLUoxetine (PROZAC) 20 MG capsule Take 1 capsule (20 mg total) by mouth daily.  90 capsule  3  . haloperidol (HALDOL) 1 MG  tablet Take 2 mg by mouth daily.      . ondansetron (ZOFRAN) 4 MG tablet Take 1 tablet (4 mg total) by mouth every 8 (eight) hours as needed for nausea.  40 tablet  0  . pantoprazole (PROTONIX) 40 MG tablet Take 1 tablet (40 mg total) by mouth daily.  90 tablet  3  . ranitidine (ZANTAC) 300 MG tablet TAKE ONE TABLET BY MOUTH ONCE DAILY AT BEDTIME  90 tablet  0  . simvastatin (ZOCOR) 40 MG tablet Take 1 tablet (40 mg total) by mouth daily.  90 tablet  3   No current facility-administered medications on file prior to visit.   Review of Systems  Constitutional: Negative for unexpected weight change, or unusual diaphoresis  HENT: Negative for tinnitus.   Eyes: Negative for photophobia and visual disturbance.  Respiratory: Negative for choking and stridor.   Gastrointestinal: Negative for vomiting and blood in stool.  Genitourinary: Negative for hematuria and decreased urine volume.  Musculoskeletal: Negative for acute joint swelling Skin: Negative for color change and wound.  Neurological: Negative for tremors and numbness other than noted  Psychiatric/Behavioral: Negative for decreased concentration or  hyperactivity.       Objective:   Physical Exam BP 142/98  Pulse 72  Temp(Src) 99.9 F (37.7 C) (Oral)  Resp 16  Wt 171 lb (77.565 kg)  BMI 25.62 kg/m2 VS noted, not ill appearing Constitutional: Pt appears well-developed and well-nourished.  HENT: Head: NCAT.  Right Ear: External ear normal.  Left Ear: External ear normal.  Eyes: Conjunctivae and EOM are normal. Pupils are equal, round, and reactive to light.  Neck: Normal range of motion. Neck supple.  Cardiovascular: Normal rate and regular rhythm.   Pulmonary/Chest: Effort normal and breath sounds normal. - no rales or wheezing  Abd:  Soft, NT, non-distended, + BS Neurological: Pt is alert. Skin: Skin is warm. No erythema.  Psychiatric: Pt behavior is normal.    Assessment & Plan:

## 2013-05-06 NOTE — Assessment & Plan Note (Signed)
Discussed - pls cut back on smoking

## 2013-05-18 ENCOUNTER — Other Ambulatory Visit: Payer: Self-pay | Admitting: Nephrology

## 2013-05-18 DIAGNOSIS — N179 Acute kidney failure, unspecified: Secondary | ICD-10-CM

## 2013-05-20 ENCOUNTER — Ambulatory Visit (INDEPENDENT_AMBULATORY_CARE_PROVIDER_SITE_OTHER): Payer: Medicare Other | Admitting: Internal Medicine

## 2013-05-20 ENCOUNTER — Encounter: Payer: Self-pay | Admitting: Internal Medicine

## 2013-05-20 VITALS — BP 120/70 | HR 98 | Temp 99.1°F | Ht 68.5 in | Wt 168.4 lb

## 2013-05-20 DIAGNOSIS — J449 Chronic obstructive pulmonary disease, unspecified: Secondary | ICD-10-CM

## 2013-05-20 DIAGNOSIS — I10 Essential (primary) hypertension: Secondary | ICD-10-CM

## 2013-05-20 DIAGNOSIS — I714 Abdominal aortic aneurysm, without rupture, unspecified: Secondary | ICD-10-CM

## 2013-05-20 DIAGNOSIS — Z23 Encounter for immunization: Secondary | ICD-10-CM

## 2013-05-20 DIAGNOSIS — J4489 Other specified chronic obstructive pulmonary disease: Secondary | ICD-10-CM

## 2013-05-20 DIAGNOSIS — R112 Nausea with vomiting, unspecified: Secondary | ICD-10-CM

## 2013-05-20 NOTE — Progress Notes (Addendum)
Subjective:    Patient ID: Ruben Blackwell, male    DOB: Apr 11, 1956, 57 y.o.   MRN: [id number]  HPI  Here to f/u, recent n/v episode resolved and Denies worsening reflux, abd pain, dysphagia, n/v, bowel change or blood.  Has stopped drinking 1 gal pepsi per day, now drinking 1 gal water.  Has been doing this for several yrs, had recent labs last Friday per renal, not called about any abnormal.  Pt denies chest pain, increased sob or doe, wheezing, orthopnea, PND, increased LE swelling, palpitations, dizziness or syncope.    Past Medical History  Diagnosis Date  . Impaired glucose tolerance 08/27/2011  . BIPOLAR DISORDER UNSPECIFIED 05/25/2009  . COPD 08/22/2010  . CORONARY ARTERY DISEASE 10/20/2009  . HYPERLIPIDEMIA 05/20/2007  . PARKINSON'S DISEASE 05/25/2009  . 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 TARDIVE DYSKINESIA 05/20/2007  . DEPRESSION 08/18/2008  . GERD 12/01/2008  . HYPERTHYROIDISM 08/18/2008  . Unspecified chronic bronchitis 10/09/2012   No past surgical history on file.  reports that he has quit smoking. He does not have any smokeless tobacco history on file. He reports that he does not drink alcohol or use illicit drugs. family history is not on file. Allergies  Allergen Reactions  . Sulfonamide Derivatives     REACTION: Reaction not known   Current Outpatient Prescriptions on File Prior to Visit  Medication Sig Dispense Refill  . divalproex (DEPAKOTE) 500 MG DR tablet Take 1 tablet (500 mg total) by mouth 2 (two) times daily. 2 by mouth every evening  180 tablet  3  . FLUoxetine (PROZAC) 20 MG capsule Take 1 capsule (20 mg total) by mouth daily.  90 capsule  3  . haloperidol (HALDOL) 1 MG tablet Take 2 mg by mouth daily.      . ondansetron (ZOFRAN) 4 MG tablet Take 1 tablet (4 mg total) by mouth every 8 (eight) hours as needed for nausea.  40 tablet  0  . pantoprazole (PROTONIX) 40 MG tablet Take 1 tablet (40 mg total) by mouth daily.  90 tablet  3  . ranitidine (ZANTAC) 300  MG tablet TAKE ONE TABLET BY MOUTH ONCE DAILY AT BEDTIME  90 tablet  0  . simvastatin (ZOCOR) 40 MG tablet Take 1 tablet (40 mg total) by mouth daily.  90 tablet  3   No current facility-administered medications on file prior to visit.      Review of Systems  Constitutional: Negative for unexpected weight change, or unusual diaphoresis  HENT: Negative for tinnitus.   Eyes: Negative for photophobia and visual disturbance.  Respiratory: Negative for choking and stridor.   Gastrointestinal: Negative for vomiting and blood in stool.  Genitourinary: Negative for hematuria and decreased urine volume.  Musculoskeletal: Negative for acute joint swelling Skin: Negative for color change and wound.  Neurological: Negative for tremors and numbness other than noted  Psychiatric/Behavioral: Negative for decreased concentration or  hyperactivity.       Objective:   Physical Exam BP 120/70  Pulse 98  Temp(Src) 99.1 F (37.3 C) (Oral)  Ht 5' 8.5" (1.74 m)  Wt 168 lb 6 oz (76.374 kg)  BMI 25.23 kg/m2  SpO2 92% VS noted,  Constitutional: Pt appears well-developed and well-nourished.  HENT: Head: NCAT.  Right Ear: External ear normal.  Left Ear: External ear normal.  Eyes: Conjunctivae and EOM are normal. Pupils are equal, round, and reactive to light.  Neck: Normal range of motion. Neck supple.  Cardiovascular: Normal rate and regular rhythm.  Pulmonary/Chest: Effort normal and breath sounds normal.  Abd:  Soft, NT, non-distended, + BS Neurological: Pt is alert. Not confused  Skin: Skin is warm. No erythema.  Psychiatric: Pt behavior is normal. Thought content normal.     Assessment & Plan:  Quality Measures addressed:  Colorectal Cancer screening: pt declines all at this time, including FOBT, flex sig, colonoscopy

## 2013-05-20 NOTE — Assessment & Plan Note (Signed)
stable overall by history and exam, recent data reviewed with pt, and pt to continue medical treatment as before,  to f/u any worsening symptoms or concerns BP Readings from Last 3 Encounters:  05/20/13 120/70  05/05/13 142/98  04/30/13 120/82

## 2013-05-20 NOTE — Assessment & Plan Note (Signed)
stable overall by history and exam, recent data reviewed with pt, and pt to continue medical treatment as before,  to f/u any worsening symptoms or concerns SpO2 Readings from Last 3 Encounters:  05/20/13 92%  04/30/13 97%  12/22/12 95%

## 2013-05-20 NOTE — Assessment & Plan Note (Signed)
Resolved, no further eval or tx needed,  to f/u any worsening symptoms or concerns

## 2013-05-20 NOTE — Assessment & Plan Note (Signed)
Incidental on recent u/s, for vasc surgury referral

## 2013-05-20 NOTE — Patient Instructions (Signed)
You had the flu shot today Please continue all other medications as before Please have the pharmacy call with any other refills you may need.  Please keep your appointments with your specialists as you have planned - kidney doctor  No need further lab work today  You will be contacted regarding the referral for: Vascular surgury for the aneurysm  Please return in 6 months, or sooner if needed

## 2013-05-25 ENCOUNTER — Ambulatory Visit
Admission: RE | Admit: 2013-05-25 | Discharge: 2013-05-25 | Disposition: A | Discharge status: Home / Self Care | Payer: Medicare Other | Source: Ambulatory Visit | Attending: Nephrology | Admitting: Nephrology

## 2013-05-25 DIAGNOSIS — N179 Acute kidney failure, unspecified: Secondary | ICD-10-CM

## 2013-05-29 ENCOUNTER — Telehealth: Payer: Self-pay | Admitting: *Deleted

## 2013-05-29 NOTE — Telephone Encounter (Signed)
Pt called requesting clarification whether he is to resume Simvastatin, Dr Posey Rea d/c'd medication on 9.23.14.  Also requests what he can do to increase his Potassium and Sodium.  Please advise

## 2013-05-29 NOTE — Telephone Encounter (Signed)
Ok to re-star the simvastatin now  He doesn't really have to be too concerned about the sodium and potassium, as long he does not drink too much fluids

## 2013-06-01 NOTE — Telephone Encounter (Signed)
Spoke with pt advised of MDs message 

## 2013-06-16 ENCOUNTER — Other Ambulatory Visit: Payer: Self-pay | Admitting: Internal Medicine

## 2013-06-19 ENCOUNTER — Encounter: Payer: Self-pay | Admitting: Surgery

## 2013-06-22 ENCOUNTER — Encounter (INDEPENDENT_AMBULATORY_CARE_PROVIDER_SITE_OTHER): Payer: Self-pay

## 2013-06-22 ENCOUNTER — Encounter: Payer: Self-pay | Admitting: Surgery

## 2013-06-22 ENCOUNTER — Ambulatory Visit (INDEPENDENT_AMBULATORY_CARE_PROVIDER_SITE_OTHER): Payer: Medicare Other | Admitting: Surgery

## 2013-06-22 VITALS — BP 148/110 | HR 77 | Ht 68.5 in | Wt 167.0 lb

## 2013-06-22 DIAGNOSIS — I714 Abdominal aortic aneurysm, without rupture, unspecified: Secondary | ICD-10-CM | POA: Insufficient documentation

## 2013-06-22 DIAGNOSIS — Z01810 Encounter for preprocedural cardiovascular examination: Secondary | ICD-10-CM

## 2013-06-22 NOTE — Progress Notes (Signed)
[facility]   Patient name: Ruben Blackwell MRN: [id number] DOB: 1956/02/27 Sex: male   Referred by: Dr. Jonny Ruiz  Reason for referral:  Chief Complaint  Patient presents with  . AAA    new pt, AAA     HISTORY OF PRESENT ILLNESS: This is a 57 year old gentleman who is referred for evaluation of an abdominal aortic aneurysm.  This was detected on ultrasound as an incidental finding.  Measurements were 4.7 x 4.5 cm.  The patient is asymptomatic.  The patient suffers from coronary artery disease.  He had R. cath and 2010 which had approximately 40% blockage.  He is medically managed for his hypercholesterolemia with a statin.  He suffers from Parkinson's and has tardive dyskinesia.  He is a heavy smoker, approximately 2.5 packs per day for 40 years.  He is on home oxygen.  He has a history of drinking a gallon of Pepsi a day  Past Medical History  Diagnosis Date  . Impaired glucose tolerance 08/27/2011  . BIPOLAR DISORDER UNSPECIFIED 05/25/2009  . COPD 08/22/2010  . CORONARY ARTERY DISEASE 10/20/2009  . HYPERLIPIDEMIA 05/20/2007  . PARKINSON'S DISEASE 05/25/2009  . 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 TARDIVE DYSKINESIA 05/20/2007  . DEPRESSION 08/18/2008  . GERD 12/01/2008  . HYPERTHYROIDISM 08/18/2008  . Unspecified chronic bronchitis 10/09/2012  . Chronic kidney disease   . Peripheral vascular disease     Past Surgical History  Procedure Laterality Date  . Cardiac catheterization    . Spine surgery      History   Social History  . Marital Status: Divorced    Spouse Name: N/A    Number of Children: N/A  . Years of Education: N/A   Occupational History  . Not on file.   Social History Main Topics  . Smoking status: Heavy Tobacco Smoker -- 2.50 packs/day for 40 years    Types: Cigarettes  . Smokeless tobacco: Never Used  . Alcohol Use: Yes     Comment: rarely  . Drug Use: No  . Sexual Activity: Not on file   Other Topics Concern  . Not on  file   Social History Narrative  . No narrative on file    Family History  Problem Relation Age of Onset  . Hyperlipidemia Mother   . Hypertension Mother   . Varicose Veins Mother   . Diabetes Father   . Heart disease Father     before age 23  . Heart attack Father   . Cancer Sister   . Hyperlipidemia Sister   . Hypertension Sister   . Cancer Brother   . Diabetes Brother   . Hyperlipidemia Brother   . Hypertension Brother     Allergies as of 06/22/2013 - Review Complete 06/22/2013  Allergen Reaction Noted  . Sulfonamide derivatives      Current Outpatient Prescriptions on File Prior to Visit  Medication Sig Dispense Refill  . divalproex (DEPAKOTE) 500 MG DR tablet Take 1 tablet (500 mg total) by mouth 2 (two) times daily. 2 by mouth every evening  180 tablet  3  . FLUoxetine (PROZAC) 20 MG capsule Take 1 capsule (20 mg total) by mouth daily.  90 capsule  3  . ondansetron (ZOFRAN) 4 MG tablet Take 1 tablet (4 mg total) by mouth every 8 (eight) hours as needed for nausea.  40 tablet  0  . pantoprazole (PROTONIX) 40 MG tablet Take 1 tablet (40 mg total) by mouth daily.  90 tablet  3  . ranitidine (ZANTAC) 300 MG tablet TAKE ONE TABLET BY MOUTH ONCE DAILY AT BEDTIME  90 tablet  3  . simvastatin (ZOCOR) 40 MG tablet Take 1 tablet (40 mg total) by mouth daily.  90 tablet  3  . haloperidol (HALDOL) 1 MG tablet Take 2 mg by mouth daily.       No current facility-administered medications on file prior to visit.     REVIEW OF SYSTEMS: Cardiovascular: No chest pain, chest pressure, palpitations, orthopnea, or dyspnea on exertion. No claudication or rest pain,  No history of DVT or phlebitis. Pulmonary: Positive shortness of breath with exertion.  Positive smoker's cough Neurologic: No weakness, paresthesias, aphasia, or amaurosis. No dizziness. Hematologic: No bleeding problems or clotting disorders. Musculoskeletal: No joint pain or joint swelling. Gastrointestinal: No blood in  stool or hematemesis Genitourinary: No dysuria or hematuria. Psychiatric:: Positive for depression  Integumentary: No rashes or ulcers. Constitutional: No fever or chills.  PHYSICAL EXAMINATION: General: The patient appears their stated age.  Vital signs are BP 148/110  Pulse 77  Ht 5' 8.5" (1.74 m)  Wt 167 lb (75.751 kg)  BMI 25.02 kg/m2  SpO2 100% HEENT:  No gross abnormalities Pulmonary: Respirations are non-labored Abdomen: Soft and non-tender aorta is nontender Musculoskeletal: There are no major deformities.   Neurologic: No focal weakness or paresthesias are detected, Skin: There are no ulcer or rashes noted. Psychiatric: The patient has normal affect. Cardiovascular: There is a regular rate and rhythm without significant murmur appreciated.  Palpable dorsalis pedis pulses bilaterally.  No carotid bruits.  Diagnostic Studies: Outside ultrasound reveals 4.7 x 4.5 cm abdominal aortic aneurysm    Assessment:  Abdominal aortic aneurysm Plan: I discussed that treatment of abdominal aortic aneurysm his based on size.  The patient's maximum diameter by ultrasound is 4.7 cm which is less than the criteria for repair.  We discussed the natural history of aneurysms and that they can to grow.  I recommended that he comes back in 6 months with a CT angiogram to better define his anatomy.  There is a reasonable chance at that time that he will need to undergo repair, particularly if it isn't a straightforward endovascular case, and it is now greater than 5 cm.  That reason, I will obtain carotid Doppler studies, lower extremity ABIs for baseline, and evaluate him for popliteal aneurysms.  Did discuss with the patient that it is conceivable that his aneurysm converse, however I think the risk is very low at this point.  I also discussed the importance of smoking cessation.     Jorge Ny, M.D. [facility] [place] Office: [phone] Pager:   [phone]

## 2013-06-30 ENCOUNTER — Encounter: Payer: Self-pay | Admitting: Internal Medicine

## 2013-07-30 ENCOUNTER — Ambulatory Visit (INDEPENDENT_AMBULATORY_CARE_PROVIDER_SITE_OTHER): Payer: Medicare Other | Admitting: Internal Medicine

## 2013-07-30 ENCOUNTER — Ambulatory Visit: Payer: Medicare Other | Admitting: Internal Medicine

## 2013-07-30 ENCOUNTER — Encounter: Payer: Self-pay | Admitting: Internal Medicine

## 2013-07-30 ENCOUNTER — Other Ambulatory Visit (INDEPENDENT_AMBULATORY_CARE_PROVIDER_SITE_OTHER): Payer: Medicare Other

## 2013-07-30 VITALS — BP 132/80 | HR 92 | Temp 97.8°F | Wt 162.0 lb

## 2013-07-30 DIAGNOSIS — R112 Nausea with vomiting, unspecified: Secondary | ICD-10-CM

## 2013-07-30 DIAGNOSIS — R634 Abnormal weight loss: Secondary | ICD-10-CM

## 2013-07-30 DIAGNOSIS — R197 Diarrhea, unspecified: Secondary | ICD-10-CM

## 2013-07-30 DIAGNOSIS — R7302 Impaired glucose tolerance (oral): Secondary | ICD-10-CM

## 2013-07-30 DIAGNOSIS — F17200 Nicotine dependence, unspecified, uncomplicated: Secondary | ICD-10-CM

## 2013-07-30 DIAGNOSIS — I10 Essential (primary) hypertension: Secondary | ICD-10-CM

## 2013-07-30 DIAGNOSIS — R7309 Other abnormal glucose: Secondary | ICD-10-CM

## 2013-07-30 LAB — CBC WITH DIFFERENTIAL/PLATELET
Basophils Absolute: 0.1 10*3/uL (ref 0.0–0.1)
Eosinophils Relative: 2.5 % (ref 0.0–5.0)
MCHC: 34.2 g/dL (ref 30.0–36.0)
MCV: 101.1 fl — ABNORMAL HIGH (ref 78.0–100.0)
Monocytes Relative: 17.3 % — ABNORMAL HIGH (ref 3.0–12.0)
Neutrophils Relative %: 55.5 % (ref 43.0–77.0)
Platelets: 238 10*3/uL (ref 150.0–400.0)
WBC: 5.8 10*3/uL (ref 4.5–10.5)

## 2013-07-30 NOTE — Progress Notes (Signed)
Subjective:    Patient ID: Ruben Blackwell, male    DOB: 1956/03/26, 57 y.o.   MRN: [id number]  HPI  Here with  C/o > 6 mo recurring n/v/d episodes usually minor and infrequent less than 1 per wk but now increased to several time per wk, but o/w able to take po between bouts, without fever, dizziness, worseing abd pain, blood but has had several lbs wt loss.  Pt denies chest pain, increased sob or doe, wheezing, orthopnea, PND, increased LE swelling, palpitations, dizziness or syncope.  Pt denies new neurological symptoms such as new headache, or facial or extremity weakness or numbness Past Medical History  Diagnosis Date  . Impaired glucose tolerance 08/27/2011  . BIPOLAR DISORDER UNSPECIFIED 05/25/2009  . COPD 08/22/2010  . CORONARY ARTERY DISEASE 10/20/2009  . HYPERLIPIDEMIA 05/20/2007  . PARKINSON'S DISEASE 05/25/2009  . 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 TARDIVE DYSKINESIA 05/20/2007  . DEPRESSION 08/18/2008  . GERD 12/01/2008  . HYPERTHYROIDISM 08/18/2008  . Unspecified chronic bronchitis 10/09/2012  . Chronic kidney disease   . Peripheral vascular disease    Past Surgical History  Procedure Laterality Date  . Cardiac catheterization    . Spine surgery      reports that he has been smoking Cigarettes.  He has a 100 pack-year smoking history. He has never used smokeless tobacco. He reports that he drinks alcohol. He reports that he does not use illicit drugs. family history includes Cancer in his brother and sister; Diabetes in his brother and father; Heart attack in his father; Heart disease in his father; Hyperlipidemia in his brother, mother, and sister; Hypertension in his brother, mother, and sister; Varicose Veins in his mother. Allergies  Allergen Reactions  . Sulfonamide Derivatives     REACTION: Reaction not known   Current Outpatient Prescriptions on File Prior to Visit  Medication Sig Dispense Refill  . divalproex (DEPAKOTE) 500 MG DR tablet Take 1 tablet (500 mg  total) by mouth 2 (two) times daily. 2 by mouth every evening  180 tablet  3  . FLUoxetine (PROZAC) 20 MG capsule Take 1 capsule (20 mg total) by mouth daily.  90 capsule  3  . haloperidol (HALDOL) 1 MG tablet Take 2 mg by mouth daily.      . ondansetron (ZOFRAN) 4 MG tablet Take 1 tablet (4 mg total) by mouth every 8 (eight) hours as needed for nausea.  40 tablet  0  . pantoprazole (PROTONIX) 40 MG tablet Take 1 tablet (40 mg total) by mouth daily.  90 tablet  3  . ranitidine (ZANTAC) 300 MG tablet TAKE ONE TABLET BY MOUTH ONCE DAILY AT BEDTIME  90 tablet  3  . simvastatin (ZOCOR) 40 MG tablet Take 1 tablet (40 mg total) by mouth daily.  90 tablet  3   No current facility-administered medications on file prior to visit.   Review of Systems  Constitutional: Negative for unexpected weight change, or unusual diaphoresis  HENT: Negative for tinnitus.   Eyes: Negative for photophobia and visual disturbance.  Respiratory: Negative for choking and stridor.   Gastrointestinal: Negative for vomiting and blood in stool.  Genitourinary: Negative for hematuria and decreased urine volume.  Musculoskeletal: Negative for acute joint swelling Skin: Negative for color change and wound.  Neurological: Negative for tremors and numbness other than noted  Psychiatric/Behavioral: Negative for decreased concentration or  hyperactivity.       Objective:   Physical Exam BP 132/80  Pulse 92  Temp(Src) 97.8  F (36.6 C) (Oral)  Wt 162 lb (73.483 kg)  SpO2 94% VS noted, mild disheveled Constitutional: Pt appears thin  HENT: Head: NCAT.  Right Ear: External ear normal.  Left Ear: External ear normal.  Eyes: Conjunctivae and EOM are normal. Pupils are equal, round, and reactive to light.  Neck: Normal range of motion. Neck supple.  Cardiovascular: Normal rate and regular rhythm.   Pulmonary/Chest: Effort normal and breath sounds normal.  Abd:  Soft, NT, non-distended, + BS Neurological: Pt is alert. Not  confused  Skin: Skin is warm. No erythema.  Psychiatric: Pt behavior is normal. Thought content normal.         Assessment & Plan:

## 2013-07-30 NOTE — Patient Instructions (Addendum)
Please continue all other medications as before, and refills have been done if requested. Please have the pharmacy call with any other refills you may need. Please keep your appointments with your specialists as you have planned - Dr Elayne Snare will be contacted regarding the referral for: GI  Please go to the LAB in the Basement (turn left off the elevator) for the tests to be done today You will be contacted by phone if any changes need to be made immediately.  Otherwise, you will receive a letter about your results with an explanation, but please check with MyChart first.  Please remember to sign up for My Chart if you have not done so, as this will be important to you in the future with finding out test results, communicating by private email, and scheduling acute appointments online when needed.

## 2013-07-30 NOTE — Progress Notes (Signed)
Pre-visit discussion using our clinic review tool. No additional management support is needed unless otherwise documented below in the visit note.  

## 2013-07-31 ENCOUNTER — Encounter: Payer: Self-pay | Admitting: Internal Medicine

## 2013-07-31 LAB — HEPATIC FUNCTION PANEL
ALT: 21 U/L (ref 0–53)
AST: 34 U/L (ref 0–37)
Albumin: 3.6 g/dL (ref 3.5–5.2)
Total Protein: 7.3 g/dL (ref 6.0–8.3)

## 2013-07-31 LAB — BASIC METABOLIC PANEL
BUN: 21 mg/dL (ref 6–23)
CO2: 31 mEq/L (ref 19–32)
Chloride: 99 mEq/L (ref 96–112)
Glucose, Bld: 60 mg/dL — ABNORMAL LOW (ref 70–99)
Potassium: 4.1 mEq/L (ref 3.5–5.1)

## 2013-08-05 NOTE — Assessment & Plan Note (Addendum)
?   Gastritis, but with some wt loss, for GI referral per pt request

## 2013-08-05 NOTE — Assessment & Plan Note (Signed)
stable overall by history and exam, recent data reviewed with pt, and pt to continue medical treatment as before,  to f/u any worsening symptoms or concerns BP Readings from Last 3 Encounters:  07/30/13 132/80  06/22/13 148/110  05/20/13 120/70

## 2013-08-05 NOTE — Assessment & Plan Note (Signed)
Recurrent, for GI referral

## 2013-08-05 NOTE — Assessment & Plan Note (Signed)
stable overall by history and exam, recent data reviewed with pt, and pt to continue medical treatment as before,  to f/u any worsening symptoms or concerns Lab Results  Component Value Date   HGBA1C 5.7 08/31/2011    

## 2013-08-05 NOTE — Assessment & Plan Note (Signed)
Urged to quit 

## 2013-08-18 ENCOUNTER — Ambulatory Visit: Payer: Medicare Other | Admitting: Internal Medicine

## 2013-09-15 ENCOUNTER — Ambulatory Visit (INDEPENDENT_AMBULATORY_CARE_PROVIDER_SITE_OTHER): Payer: Medicare Other | Admitting: Internal Medicine

## 2013-09-15 ENCOUNTER — Encounter: Payer: Self-pay | Admitting: Internal Medicine

## 2013-09-15 VITALS — BP 130/84 | HR 90 | Ht 68.5 in | Wt 162.2 lb

## 2013-09-15 DIAGNOSIS — K589 Irritable bowel syndrome without diarrhea: Secondary | ICD-10-CM

## 2013-09-15 DIAGNOSIS — Z1211 Encounter for screening for malignant neoplasm of colon: Secondary | ICD-10-CM

## 2013-09-15 DIAGNOSIS — IMO0001 Reserved for inherently not codable concepts without codable children: Secondary | ICD-10-CM

## 2013-09-15 DIAGNOSIS — R1110 Vomiting, unspecified: Secondary | ICD-10-CM

## 2013-09-15 MED ORDER — NA SULFATE-K SULFATE-MG SULF 17.5-3.13-1.6 GM/177ML PO SOLN: ORAL | Status: DC

## 2013-09-15 MED ORDER — MOVIPREP 100 G PO SOLR: ORAL | Status: DC

## 2013-09-15 NOTE — Patient Instructions (Addendum)
You have been scheduled for an endoscopy and colonoscopy with propofol. Please follow the written instructions given to you at your visit today. Please use the moviprep kit you have been given today. If you use inhalers (even only as needed), please bring them with you on the day of your procedure.   I appreciate the opportunity to care for you.   Called [facility] and canceled the suprep bowel prep kit.

## 2013-09-15 NOTE — Progress Notes (Signed)
Subjective:  Referred by: Biagio Borg, MD   Patient ID: Ruben Blackwell, male    DOB: 1956/06/18, 58 y.o.   MRN: [id number]  HPI The patient is a very nice middle-aged man here with complaints of intermittent regurgitation. The chart indicates nausea and vomiting but he says that soon after he eats she will regurgitate food particles or liquids. He doesn't really have nausea he just has this intermittent regurgitation which is old is better. The chart notes he has lost some weight. His appetite is off a little bit. He has schizoaffective disorder and is on chronic antipsychotic medications as well. He says for years she has had episodic urgent defecation. He says it depends upon what he eats. When asked to be specific he says if he eats lettuce and tomato is he will have urgent defecation problems sometimes loose. Many days he has normal defecation pattern is without problems. He is on treatment for acid reflux which sounds like a successful at this time with PPI and ranitidine. He reports that his niece and his sister have been patients of mine. He is reducing but still drinks a large amount of caffeinated sodas daily. Gastroenterology review of systems is otherwise negative. Wt Readings from Last 3 Encounters:  09/15/13 162 lb 3.2 oz (73.573 kg)  07/30/13 162 lb (73.483 kg)  06/22/13 167 lb (75.751 kg)     Review of Systems Positive for allergies anxiety, chronic cough dyspnea he drinks excessively both water and a large amount of caffeine. All other review of systems negative or as per history of present illness.    Objective:   Physical Exam General:  Slightly disheveled, tremulous diffusely, smells of smoke Eyes:  anicteric. ENT:   Mouth and posterior pharynx free of lesions.  Neck:   supple w/o thyromegaly or mass.  Lungs: Coarse to auscultation bilaterally. Heart:  S1S2, no rubs, murmurs, gallops. Abdomen:  soft, non-tender, no hepatosplenomegaly, there is a small  reducible umbilical hernia, no mass and BS+.  Rectal: deferred Lymph:  no cervical or supraclavicular adenopathy. Extremities:   no edema Skin   no rash. Neuro:  A&O x 3. - diffuse tremors Psych:  appropriate mood and  Affect.   Data Reviewed: PCP notes Lab Results  Component Value Date   WBC 5.8 07/30/2013   HGB 13.0 07/30/2013   HCT 38.0* 07/30/2013   MCV 101.1* 07/30/2013   PLT 238.0 07/30/2013     Chemistry      Component Value Date/Time   NA 135 07/30/2013 1722   K 4.1 07/30/2013 1722   CL 99 07/30/2013 1722   CO2 31 07/30/2013 1722   BUN 21 07/30/2013 1722   CREATININE 2.1* 07/30/2013 1722      Component Value Date/Time   CALCIUM 8.8 07/30/2013 1722   ALKPHOS 70 07/30/2013 1722   AST 34 07/30/2013 1722   ALT 21 07/30/2013 1722   BILITOT 0.2* 07/30/2013 1722        Assessment & Plan:   1. Regurgitation   2. Special screening for malignant neoplasms, colon   3. IBS (irritable bowel syndrome)    The patient has some chronic illness obviously. He's lost a lot of weight nothing significant at this point. I think he has regurgitation which may be a manifestation of reflux and could be a side effect of medications. It sounds like he has irritable bowel syndrome with chronic episodic urgent defecation.  Plan will be for an upper  GI endoscopy, diagnostic and a screening colonoscopy.The risks and benefits as well as alternatives of endoscopic procedure(s) have been discussed and reviewed. All questions answered. The patient agrees to proceed.

## 2013-10-15 ENCOUNTER — Telehealth: Payer: Self-pay | Admitting: Internal Medicine

## 2013-10-15 NOTE — Telephone Encounter (Signed)
Questions about diet for EGD/Colon prep answered and he verbalized understanding.  He said he might call back next week just to make sure he's doing things correctly to prep.

## 2013-10-21 ENCOUNTER — Telehealth: Payer: Self-pay

## 2013-10-21 NOTE — Telephone Encounter (Signed)
Questions about the diet for colon prepping answered.  He wants to make sure he eats correctly.

## 2013-10-26 ENCOUNTER — Ambulatory Visit (AMBULATORY_SURGERY_CENTER): Payer: Medicare Other | Admitting: Internal Medicine

## 2013-10-26 ENCOUNTER — Encounter: Payer: Self-pay | Admitting: Internal Medicine

## 2013-10-26 VITALS — BP 127/83 | HR 80 | Temp 97.2°F | Resp 23 | Ht 68.0 in | Wt 162.0 lb

## 2013-10-26 DIAGNOSIS — K2270 Barrett's esophagus without dysplasia: Secondary | ICD-10-CM

## 2013-10-26 DIAGNOSIS — K219 Gastro-esophageal reflux disease without esophagitis: Secondary | ICD-10-CM

## 2013-10-26 DIAGNOSIS — Z860100 Personal history of colon polyps, unspecified: Secondary | ICD-10-CM

## 2013-10-26 DIAGNOSIS — R1110 Vomiting, unspecified: Secondary | ICD-10-CM

## 2013-10-26 DIAGNOSIS — D126 Benign neoplasm of colon, unspecified: Secondary | ICD-10-CM

## 2013-10-26 DIAGNOSIS — K5790 Diverticulosis of intestine, part unspecified, without perforation or abscess without bleeding: Secondary | ICD-10-CM

## 2013-10-26 DIAGNOSIS — Z1211 Encounter for screening for malignant neoplasm of colon: Secondary | ICD-10-CM

## 2013-10-26 DIAGNOSIS — IMO0001 Reserved for inherently not codable concepts without codable children: Secondary | ICD-10-CM

## 2013-10-26 HISTORY — DX: Diverticulosis of intestine, part unspecified, without perforation or abscess without bleeding: K57.90

## 2013-10-26 HISTORY — DX: Personal history of colonic polyps: Z86.010

## 2013-10-26 HISTORY — DX: Barrett's esophagus without dysplasia: K22.70

## 2013-10-26 HISTORY — DX: Personal history of colon polyps, unspecified: Z86.0100

## 2013-10-26 MED ORDER — SODIUM CHLORIDE 0.9 % IV SOLN: 500.0000 mL | INTRAVENOUS | Status: DC

## 2013-10-26 NOTE — Op Note (Signed)
[place]  [address] [place], [id number]   ENDOSCOPY PROCEDURE REPORT  PATIENT: Ruben Blackwell, Ruben Blackwell  MR#: [id number] BIRTHDATE: 22-Apr-1956 , 17  yrs. old GENDER: Male ENDOSCOPIST: Gatha Mayer, MD, [facility] PROCEDURE DATE:  10/26/2013 PROCEDURE:  EGD w/ biopsy ASA CLASS:     Class III INDICATIONS:  History of reflux esophagitis/regurgitation despite PPI and H2B MEDICATIONS: propofol (Diprivan) 200mg  IV, MAC sedation, administered by CRNA, and These medications were titrated to patient response per physician's verbal order TOPICAL ANESTHETIC: none  DESCRIPTION OF PROCEDURE: After the risks benefits and alternatives of the procedure were thoroughly explained, informed consent was obtained.  The LB [id number] endoscope was introduced through the mouth and advanced to the second portion of the duodenum. Without limitations.  The instrument was slowly withdrawn as the mucosa was fully examined.    ESOPHAGUS: There was evidence of suspected Barrett's esophagus 32 cm-40 cm  from the incisors.  Multiple biopsies were performed using cold forceps.  Sample sent for histology.   No nodules, ulcers or other suspicious areas.  The remainder of the upper endoscopy exam was otherwise normal. Retroflexed views revealed no abnormalities.     The scope was then withdrawn from the patient and the procedure completed.  COMPLICATIONS: There were no complications. ENDOSCOPIC IMPRESSION: 1.   There was evidence of suspected Barrett's esophagus; multiple biopsies 32-40 cm 2.   The remainder of the upper endoscopy exam was otherwise normal  RECOMMENDATIONS: 1.  Await pathology results 2.  My office will arrange for a solid phase gastric emptying study to evaluate reflux and regurgitation. He will need a follow-up visit after that test is completed - we will schedule that also - not he has TD and Parkinson's so cannot use metaclopramide   eSigned:  Gatha Mayer,  MD, [facility] 10/26/2013 3:19 PM   DQ:QIWLN Quin Hoop, MD and The Patient

## 2013-10-26 NOTE — Op Note (Signed)
[place]  [address] [place], [id number]   COLONOSCOPY PROCEDURE REPORT  PATIENT: [name], [name]  MR#: [id number] BIRTHDATE: 02/10/1956 , 24  yrs. old GENDER: Male ENDOSCOPIST: Gatha Mayer, MD, [facility] PROCEDURE DATE:  10/26/2013 PROCEDURE:   Colonoscopy with snare polypectomy First Screening Colonoscopy - Avg.  risk and is 50 yrs.  old or older Yes.  Prior Negative Screening - Now for repeat screening. N/A  History of Adenoma - Now for follow-up colonoscopy & has been > or = to 3 yrs.  N/A  Polyps Removed Today? Yes. ASA CLASS:   Class III INDICATIONS:average risk screening and first colonoscopy. MEDICATIONS: Propofol (Diprivan) 230 mg IV, MAC sedation, administered by CRNA, and These medications were titrated to patient response per physician's verbal order  DESCRIPTION OF PROCEDURE:   After the risks benefits and alternatives of the procedure were thoroughly explained, informed consent was obtained.  A digital rectal exam revealed no abnormalities of the rectum, A digital rectal exam revealed no prostatic nodules, and A digital rectal exam revealed the prostate was not enlarged.   The LB [id number]  endoscope was introduced through the anus and advanced to the cecum, which was identified by both the appendix and ileocecal valve. No adverse events experienced.   The quality of the prep was Suprep good  The instrument was then slowly withdrawn as the colon was fully examined.      COLON FINDINGS: Five sessile polyps measuring 5-10 mm in size were found in the transverse colon (2), at the splenic flexure (1), in the descending colon (1), and sigmoid colon (1)  A polypectomy was performed with a cold snare (TRANSVERSE X 1 SIGMOID X 1) and using snare cautery (others).  The resection was complete and the polyp tissue was completely retrieved.   Mild diverticulosis was noted in the sigmoid colon.   The colon mucosa was otherwise normal.    A right colon retroflexion was performed.  Retroflexed views revealed no abnormalities. The time to cecum=1 minutes 25 seconds. Withdrawal time=15 minutes 12 seconds.  The scope was withdrawn and the procedure completed. COMPLICATIONS: There were no complications.  ENDOSCOPIC IMPRESSION: 1.   Five sessile polyps measuring 5-10 mm in size were found in the transverse colon, at the splenic flexure, in the descending colon, and sigmoid colon; polypectomy was performed with a cold snare and using snare cautery 2.   Mild diverticulosis was noted in the sigmoid colon 3.   The colon mucosa was otherwise normal - GOOD PREP FIRST COLONOSCOPY  RECOMMENDATIONS: 1.  Timing of repeat colonoscopy will be determined by pathology findings. 2.  Hold aspirin, aspirin products, and anti-inflammatory medication for 2 weeks.   eSigned:  Gatha Mayer, MD, [facility] 10/26/2013 3:25 PM   cc: Biagio Borg, MD and The Patient   PATIENT NAME:  [name], [name] MR#: [id number]

## 2013-10-26 NOTE — [place] Notes (Signed)
Procedure ends, to recovery, report given and VSS. 

## 2013-10-26 NOTE — [place] Notes (Signed)
Called to room to assist during endoscopic procedure.  Patient ID and intended procedure confirmed with present staff. Received instructions for my participation in the procedure from the performing physician.  

## 2013-10-26 NOTE — Patient Instructions (Addendum)
The upper endoscopy exam showed changes in the esophagus that look like a condition called Barrett's esophagus. I took biopsies to see if this is so.  I found and removed 5 polyps from the colon - all look benign.  I am going to have my nurse order a test called a gastric emptying scan - she will call you about this and will schedule a follow-up appointment for after that test.  I will let you know pathology results and when to have another routine colonoscopy by mail.  I appreciate the opportunity to care for you. Gatha Mayer, MD, FACG    YOU HAD AN ENDOSCOPIC PROCEDURE TODAY AT [facility]: Refer to the procedure report that was given to you for any specific questions about what was found during the examination.  If the procedure report does not answer your questions, please call your gastroenterologist to clarify.  If you requested that your care partner not be given the details of your procedure findings, then the procedure report has been included in a sealed envelope for you to review at your convenience later.  YOU SHOULD EXPECT: Some feelings of bloating in the abdomen. Passage of more gas than usual.  Walking can help get rid of the air that was put into your GI tract during the procedure and reduce the bloating. If you had a lower endoscopy (such as a colonoscopy or flexible sigmoidoscopy) you may notice spotting of blood in your stool or on the toilet paper. If you underwent a bowel prep for your procedure, then you may not have a normal bowel movement for a few days.  DIET: Your first meal following the procedure should be a light meal and then it is ok to progress to your normal diet.  A half-sandwich or bowl of soup is an example of a good first meal.  Heavy or fried foods are harder to digest and may make you feel nauseous or bloated.  Likewise meals heavy in dairy and vegetables can cause extra gas to form and this can also increase the bloating.  Drink plenty of  fluids but you should avoid alcoholic beverages for 24 hours.  ACTIVITY: Your care partner should take you home directly after the procedure.  You should plan to take it easy, moving slowly for the rest of the day.  You can resume normal activity the day after the procedure however you should NOT DRIVE or use heavy machinery for 24 hours (because of the sedation medicines used during the test).    SYMPTOMS TO REPORT IMMEDIATELY: A gastroenterologist can be reached at any hour.  During normal business hours, 8:30 AM to 5:00 PM Monday through Friday, call [phone].  After hours and on weekends, please call the GI answering service at [phone] who will take a message and have the physician on call contact you.   Following lower endoscopy (colonoscopy or flexible sigmoidoscopy):  Excessive amounts of blood in the stool  Significant tenderness or worsening of abdominal pains  Swelling of the abdomen that is new, acute  Fever of 100F or higher  Following upper endoscopy (EGD)  Vomiting of blood or coffee ground material  New chest pain or pain under the shoulder blades  Painful or persistently difficult swallowing  New shortness of breath  Fever of 100F or higher  Black, tarry-looking stools  FOLLOW UP: If any biopsies were taken you will be contacted by phone or by letter within the next 1-3 weeks.  Call  your gastroenterologist if you have not heard about the biopsies in 3 weeks.  Our staff will call the home number listed on your records the next business day following your procedure to check on you and address any questions or concerns that you may have at that time regarding the information given to you following your procedure. This is a courtesy call and so if there is no answer at the home number and we have not heard from you through the emergency physician on call, we will assume that you have returned to your regular daily activities without  incident.  SIGNATURES/CONFIDENTIALITY: You and/or your care partner have signed paperwork which will be entered into your electronic medical record.  These signatures attest to the fact that that the information above on your After Visit Summary has been reviewed and is understood.  Full responsibility of the confidentiality of this discharge information lies with you and/or your care-partner.   Await biopsy results  Dr. Celesta Aver office will call you to set up gastric emptying study and a follow up appointment with Dr. Carlean Purl   Information on polyps given to you today  Information on reflux also given to you per Dr Carlean Purl

## 2013-10-27 ENCOUNTER — Telehealth: Payer: Self-pay | Admitting: *Deleted

## 2013-10-27 NOTE — Telephone Encounter (Signed)
  Follow up Call-  Call back number 10/26/2013  Post procedure Call Back phone  # [phone]  Permission to leave phone message Yes     Patient questions:  Do you have a fever, pain , or abdominal swelling? no Pain Score  0 *  Have you tolerated food without any problems? yes  Have you been able to return to your normal activities? yes  Do you have any questions about your discharge instructions: Diet   no Medications  no Follow up visit  no  Do you have questions or concerns about your Care? no  Actions: * If pain score is 4 or above: No action needed, pain <4.

## 2013-10-28 ENCOUNTER — Encounter: Payer: Self-pay | Admitting: Internal Medicine

## 2013-10-28 ENCOUNTER — Ambulatory Visit (INDEPENDENT_AMBULATORY_CARE_PROVIDER_SITE_OTHER): Payer: Medicare Other | Admitting: Internal Medicine

## 2013-10-28 VITALS — BP 132/80 | HR 103 | Temp 98.7°F | Wt 160.0 lb

## 2013-10-28 DIAGNOSIS — R7309 Other abnormal glucose: Secondary | ICD-10-CM

## 2013-10-28 DIAGNOSIS — E782 Mixed hyperlipidemia: Secondary | ICD-10-CM

## 2013-10-28 DIAGNOSIS — I10 Essential (primary) hypertension: Secondary | ICD-10-CM

## 2013-10-28 DIAGNOSIS — I2510 Atherosclerotic heart disease of native coronary artery without angina pectoris: Secondary | ICD-10-CM

## 2013-10-28 DIAGNOSIS — R7302 Impaired glucose tolerance (oral): Secondary | ICD-10-CM

## 2013-10-28 DIAGNOSIS — N320 Bladder-neck obstruction: Secondary | ICD-10-CM

## 2013-10-28 DIAGNOSIS — F259 Schizoaffective disorder, unspecified: Secondary | ICD-10-CM

## 2013-10-28 MED ORDER — RANITIDINE HCL 300 MG PO TABS: 300.0000 mg | ORAL_TABLET | Freq: Every day | ORAL | Status: DC

## 2013-10-28 NOTE — Assessment & Plan Note (Signed)
ECG reviewed as per emr, stable overall by history and exam, recent data reviewed with pt, and pt to continue medical treatment as before,  to f/u any worsening symptoms or concerns BP Readings from Last 3 Encounters:  10/28/13 132/80  10/26/13 127/83  09/15/13 130/84

## 2013-10-28 NOTE — Progress Notes (Signed)
Subjective:    Patient ID: Ruben Blackwell, male    DOB: 01/05/1956, 58 y.o.   MRN: [id number]  HPI  Here for yearly f/u;  Overall doing ok;  Pt denies CP, worsening SOB, DOE, wheezing, orthopnea, PND, worsening LE edema, palpitations, dizziness or syncope.  Pt denies neurological change such as new headache, facial or extremity weakness.  Pt denies polydipsia, polyuria, or low sugar symptoms. Pt states overall good compliance with treatment and medications, good tolerability, and has been trying to follow lower cholesterol diet.  Pt denies worsening depressive symptoms, suicidal ideation or panic. No fever, night sweats, wt loss, loss of appetite, or other constitutional symptoms.  Pt states good ability with ADL's, has low fall risk, home safety reviewed and adequate, no other significant changes in hearing or vision.  Overall good compliance with treatment, and good medicine tolerability, f/u with psychiatry on regular basis.   Past Medical History  Diagnosis Date  . Impaired glucose tolerance 08/27/2011  . BIPOLAR DISORDER UNSPECIFIED 05/25/2009  . COPD 08/22/2010  . CORONARY ARTERY DISEASE 10/20/2009  . HYPERLIPIDEMIA 05/20/2007  . PARKINSON'S DISEASE 05/25/2009  . 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 TARDIVE DYSKINESIA 05/20/2007  . DEPRESSION 08/18/2008  . GERD 12/01/2008  . HYPERTHYROIDISM 08/18/2008  . Unspecified chronic bronchitis 10/09/2012  . Chronic kidney disease   . Peripheral vascular disease    Past Surgical History  Procedure Laterality Date  . Cardiac catheterization    . Spine surgery    . Hernia repair      reports that he has been smoking Cigarettes.  He has a 100 pack-year smoking history. He has never used smokeless tobacco. He reports that he drinks alcohol. He reports that he does not use illicit drugs. family history includes Cancer in his brother and sister; Diabetes in his brother and father; Heart attack in his father; Heart disease in his father; Hyperlipidemia  in his brother, mother, and sister; Hypertension in his brother, mother, and sister; Varicose Veins in his mother. Allergies  Allergen Reactions  . Sulfonamide Derivatives     REACTION: Reaction not known   Current Outpatient Prescriptions on File Prior to Visit  Medication Sig Dispense Refill  . divalproex (DEPAKOTE) 500 MG DR tablet Take 1 tablet (500 mg total) by mouth 2 (two) times daily. 2 by mouth every evening  180 tablet  3  . FLUoxetine (PROZAC) 20 MG capsule Take 1 capsule (20 mg total) by mouth daily.  90 capsule  3  . haloperidol (HALDOL) 1 MG tablet Take 2 mg by mouth daily.      . ondansetron (ZOFRAN) 4 MG tablet Take 1 tablet (4 mg total) by mouth every 8 (eight) hours as needed for nausea.  40 tablet  0  . pantoprazole (PROTONIX) 40 MG tablet Take 1 tablet (40 mg total) by mouth daily.  90 tablet  3  . simvastatin (ZOCOR) 40 MG tablet Take 1 tablet (40 mg total) by mouth daily.  90 tablet  3   No current facility-administered medications on file prior to visit.   Review of Systems  Constitutional: Negative for unexpected weight change, or unusual diaphoresis  HENT: Negative for tinnitus.   Eyes: Negative for photophobia and visual disturbance.  Respiratory: Negative for choking and stridor.   Gastrointestinal: Negative for vomiting and blood in stool.  Genitourinary: Negative for hematuria and decreased urine volume.  Musculoskeletal: Negative for acute joint swelling Skin: Negative for color change and wound.  Neurological: Negative for tremors and  numbness other than noted  Psychiatric/Behavioral: Negative for decreased concentration or  hyperactivity.       Objective:   Physical Exam BP 132/80  Pulse 103  Temp(Src) 98.7 F (37.1 C) (Oral)  Wt 160 lb (72.576 kg)  SpO2 95% VS noted,  Constitutional: Pt appears well-developed and well-nourished.  HENT: Head: NCAT.  Right Ear: External ear normal.  Left Ear: External ear normal.  Eyes: Conjunctivae and EOM  are normal. Pupils are equal, round, and reactive to light.  Neck: Normal range of motion. Neck supple.  Cardiovascular: Normal rate and regular rhythm.   Pulmonary/Chest: Effort normal and breath sounds normal.  Abd:  Soft, NT, non-distended, + BS Neurological: Pt is alert. Not confused , has TD movements persistent Skin: Skin is warm. No erythema.  Psychiatric: Pt behavior is normal. Thought content normal.         Assessment & Plan:

## 2013-10-28 NOTE — Assessment & Plan Note (Signed)
stable overall by history and exam, recent data reviewed with pt, and pt to continue medical treatment as before,  to f/u any worsening symptoms or concerns Lab Results  Component Value Date   LDLCALC 96 08/31/2011

## 2013-10-28 NOTE — Assessment & Plan Note (Signed)
stable overall by history and exam, recent data reviewed with pt, and pt to continue medical treatment as before,  to f/u any worsening symptoms or concerns Lab Results  Component Value Date   HGBA1C 5.7 08/31/2011    

## 2013-10-28 NOTE — Assessment & Plan Note (Signed)
ECG reviewed as per emr, stable overall by history and exam,  and pt to continue medical treatment as before,  to f/u any worsening symptoms or concerns

## 2013-10-28 NOTE — Patient Instructions (Addendum)
Your EKG was OK today  Please continue all other medications as before, and refills have been done if requested. Please have the pharmacy call with any other refills you may need.  Please continue your efforts at being more active, low cholesterol diet, and weight control.  Please keep your appointments with your specialists as you have planned  You are otherwise up to date with prevention measures today.  Please keep your appointments with your specialists as you have planned  Please go to the LAB in the Basement (turn left off the elevator) for the tests to be done tomorrow or at your earliest convenience  You will be contacted by phone if any changes need to be made immediately.  Otherwise, you will receive a letter about your results with an explanation, but please check with MyChart first.  Please return in 6 months, or sooner if needed

## 2013-10-28 NOTE — Progress Notes (Signed)
Pre visit review using our clinic review tool, if applicable. No additional management support is needed unless otherwise documented below in the visit note. 

## 2013-10-28 NOTE — Assessment & Plan Note (Signed)
Also for PSA as he is due

## 2013-10-28 NOTE — Assessment & Plan Note (Signed)
For valproic acid level,  to f/u any worsening symptoms or concerns with psychiatry as he does

## 2013-11-03 ENCOUNTER — Encounter: Payer: Self-pay | Admitting: Internal Medicine

## 2013-11-03 ENCOUNTER — Telehealth: Payer: Self-pay

## 2013-11-03 DIAGNOSIS — Z860100 Personal history of colon polyps, unspecified: Secondary | ICD-10-CM | POA: Insufficient documentation

## 2013-11-03 DIAGNOSIS — K2270 Barrett's esophagus without dysplasia: Secondary | ICD-10-CM | POA: Insufficient documentation

## 2013-11-03 DIAGNOSIS — R112 Nausea with vomiting, unspecified: Secondary | ICD-10-CM

## 2013-11-03 NOTE — Telephone Encounter (Signed)
Patient is scheduled for 11/17/13 9:30 arrival at [facility] and he needs to be NPO after midnight, he is also advised to hold medications until after the scan REV scheduled for 12/23/13 9:45

## 2013-11-03 NOTE — Progress Notes (Signed)
Quick Note:  + intestinal metaplasia - repeat EGD barrett's 1 year 2016 Adenomas/ssp - repeat colon 3 years 2018 ______

## 2013-11-03 NOTE — Telephone Encounter (Signed)
Message copied by Marlon Pel on Tue Nov 03, 2013 12:43 PM ------      Message from: Gatha Mayer      Created: Tue Nov 03, 2013 11:38 AM      Regarding: needs gastric emptying study       He needs a GES (see EGD) and also f/u visit next avail after that ------

## 2013-11-04 ENCOUNTER — Other Ambulatory Visit (INDEPENDENT_AMBULATORY_CARE_PROVIDER_SITE_OTHER): Payer: Medicare Other

## 2013-11-04 DIAGNOSIS — E782 Mixed hyperlipidemia: Secondary | ICD-10-CM

## 2013-11-04 DIAGNOSIS — F259 Schizoaffective disorder, unspecified: Secondary | ICD-10-CM

## 2013-11-04 DIAGNOSIS — N320 Bladder-neck obstruction: Secondary | ICD-10-CM

## 2013-11-04 LAB — CBC WITH DIFFERENTIAL/PLATELET
BASOS PCT: 0.4 % (ref 0.0–3.0)
Basophils Absolute: 0 10*3/uL (ref 0.0–0.1)
Eosinophils Absolute: 0.3 10*3/uL (ref 0.0–0.7)
Eosinophils Relative: 4.4 % (ref 0.0–5.0)
HCT: 37.4 % — ABNORMAL LOW (ref 39.0–52.0)
HEMOGLOBIN: 12.5 g/dL — AB (ref 13.0–17.0)
Lymphocytes Relative: 16.4 % (ref 12.0–46.0)
Lymphs Abs: 1.1 10*3/uL (ref 0.7–4.0)
MCHC: 33.5 g/dL (ref 30.0–36.0)
MCV: 101.6 fl — AB (ref 78.0–100.0)
MONO ABS: 0.7 10*3/uL (ref 0.1–1.0)
Monocytes Relative: 11 % (ref 3.0–12.0)
NEUTROS ABS: 4.4 10*3/uL (ref 1.4–7.7)
Neutrophils Relative %: 67.8 % (ref 43.0–77.0)
Platelets: 326 10*3/uL (ref 150.0–400.0)
RBC: 3.68 Mil/uL — AB (ref 4.22–5.81)
RDW: 13.6 % (ref 11.5–14.6)
WBC: 6.6 10*3/uL (ref 4.5–10.5)

## 2013-11-04 LAB — URINALYSIS, ROUTINE W REFLEX MICROSCOPIC
BILIRUBIN URINE: NEGATIVE
Ketones, ur: NEGATIVE
Leukocytes, UA: NEGATIVE
Nitrite: NEGATIVE
Specific Gravity, Urine: <=1.005 — AB (ref 1.000–1.030)
TOTAL PROTEIN, URINE-UPE24: 30 — AB
URINE GLUCOSE: NEGATIVE
Urobilinogen, UA: 0.2 (ref 0.0–1.0)
pH: 6.5 (ref 5.0–8.0)

## 2013-11-04 LAB — LIPID PANEL
CHOL/HDL RATIO: 4
Cholesterol: 184 mg/dL (ref 0–200)
HDL: 41 mg/dL (ref >39.00)
LDL Cholesterol: 99 mg/dL (ref 0–99)
Triglycerides: 219 mg/dL — ABNORMAL HIGH (ref 0.0–149.0)
VLDL: 43.8 mg/dL — ABNORMAL HIGH (ref 0.0–40.0)

## 2013-11-04 LAB — HEPATIC FUNCTION PANEL
ALT: 13 U/L (ref 0–53)
AST: 15 U/L (ref 0–37)
Albumin: 3.7 g/dL (ref 3.5–5.2)
Alkaline Phosphatase: 68 U/L (ref 39–117)
BILIRUBIN DIRECT: 0.1 mg/dL (ref 0.0–0.3)
BILIRUBIN TOTAL: 0.3 mg/dL (ref 0.3–1.2)
Total Protein: 7 g/dL (ref 6.0–8.3)

## 2013-11-04 LAB — PSA: PSA: 0.99 ng/mL (ref 0.10–4.00)

## 2013-11-04 LAB — BASIC METABOLIC PANEL
BUN: 16 mg/dL (ref 6–23)
CALCIUM: 9.1 mg/dL (ref 8.4–10.5)
CHLORIDE: 100 meq/L (ref 96–112)
CO2: 28 mEq/L (ref 19–32)
CREATININE: 1.9 mg/dL — AB (ref 0.4–1.5)
GFR: 38.74 mL/min — ABNORMAL LOW (ref >60.00)
Glucose, Bld: 80 mg/dL (ref 70–99)
Potassium: 3.7 mEq/L (ref 3.5–5.1)
Sodium: 135 mEq/L (ref 135–145)

## 2013-11-04 LAB — TSH: TSH: 0.74 u[IU]/mL (ref 0.35–5.50)

## 2013-11-05 LAB — VALPROIC ACID LEVEL: Valproic Acid Lvl: 70.9 ug/mL (ref 50.0–100.0)

## 2013-11-06 ENCOUNTER — Encounter: Payer: Self-pay | Admitting: Internal Medicine

## 2013-11-07 ENCOUNTER — Other Ambulatory Visit: Payer: Self-pay | Admitting: Internal Medicine

## 2013-11-13 ENCOUNTER — Other Ambulatory Visit: Payer: Self-pay | Admitting: Internal Medicine

## 2013-11-17 ENCOUNTER — Ambulatory Visit (HOSPITAL_COMMUNITY)
Admission: RE | Admit: 2013-11-17 | Discharge: 2013-11-17 | Disposition: A | Discharge status: Home / Self Care | Payer: Medicare Other | Source: Ambulatory Visit | Attending: Internal Medicine | Admitting: Internal Medicine

## 2013-11-17 ENCOUNTER — Telehealth: Payer: Self-pay | Admitting: Internal Medicine

## 2013-11-17 DIAGNOSIS — R112 Nausea with vomiting, unspecified: Secondary | ICD-10-CM

## 2013-11-17 NOTE — Telephone Encounter (Signed)
FYI

## 2013-11-17 NOTE — Telephone Encounter (Signed)
Schedule REV next available

## 2013-11-18 NOTE — Telephone Encounter (Signed)
Left message for patient to call back  

## 2013-11-18 NOTE — Telephone Encounter (Signed)
Patient scheduled for 12/15/13 3:45

## 2013-12-15 ENCOUNTER — Encounter: Payer: Self-pay | Admitting: Internal Medicine

## 2013-12-15 ENCOUNTER — Ambulatory Visit (INDEPENDENT_AMBULATORY_CARE_PROVIDER_SITE_OTHER): Payer: Medicare Other | Admitting: Internal Medicine

## 2013-12-15 VITALS — BP 134/70 | HR 68 | Ht 68.5 in | Wt 157.0 lb

## 2013-12-15 DIAGNOSIS — K219 Gastro-esophageal reflux disease without esophagitis: Secondary | ICD-10-CM

## 2013-12-15 DIAGNOSIS — I2510 Atherosclerotic heart disease of native coronary artery without angina pectoris: Secondary | ICD-10-CM

## 2013-12-15 MED ORDER — OMEPRAZOLE 40 MG PO CPDR
40.0000 mg | DELAYED_RELEASE_CAPSULE | Freq: Every day | ORAL | Status: DC
Start: 2013-12-15 — End: 2014-01-28

## 2013-12-15 MED ORDER — ALUM HYDROXIDE-MAG CARBONATE 95-358 MG/15ML PO SUSP: 30.0000 mL | Freq: Every day | ORAL | Status: DC

## 2013-12-15 NOTE — Patient Instructions (Signed)
Try to quit smoking, and try to cut down/eliminate caffeine.  We have sent the following medications to your pharmacy for you to pick up at your convenience: Omeprazole , take one daily 30 minutes before supper.  Purchase Gaviscon over the counter and take 2 Tablespoons daily at bedtime.  Call us back in 1-2 months if no better.   I appreciate the opportunity to care for you.

## 2013-12-15 NOTE — Progress Notes (Signed)
         Subjective:    Patient ID: Ruben Blackwell, male    DOB: 1955/09/20, 58 y.o.   MRN: [id number]  HPI Patient is here for followup regurgitation and vomiting. He continues to regurgitate most days, with reflux and vomiting in the mornings mostly. He does not seem to have much heartburn at this time. He still drinks an excessive amount of caffeine in the form of soda daily. Though he is reducing that. He continues to smoke. I set him up for a yesterday emptying study but he was unable to complete that because he vomited that morning. I reviewed his medications with him he is taking ranitidine at night, I'm not entirely convinced he is taking pantoprazole though he says he is taking most if not all of his medicines at night.  Wt Readings from Last 3 Encounters:  12/15/13 157 lb (71.215 kg)  10/28/13 160 lb (72.576 kg)  10/26/13 162 lb (73.483 kg)   Medications, allergies, past medical history, past surgical history, family history and social history are reviewed and updated in the EMR. Review of Systems As above.    Objective:   Physical Exam Chronically ill + tardive dyskinesia, tremors, akathesia    Assessment & Plan:   1. GERD (gastroesophageal reflux disease)    I think mostly has a regurgitation problem. Reglan might help his problem but unfortunately on he has extrapyramidal side effect problems from his schizoaffective disorder medication. So I can use that. At this point I'm going to defer any further testing and we'll try to put on a regimen of PPI before supper and see how it goes with that and Gaviscon at bedtime. I think mostly is regurgitating. I've also instructed them to try to reduce caffeine and to call back if he is having persistent problems medications

## 2013-12-18 ENCOUNTER — Encounter: Payer: Self-pay | Admitting: Surgery

## 2013-12-19 ENCOUNTER — Other Ambulatory Visit: Payer: Self-pay | Admitting: Surgery

## 2013-12-19 LAB — BUN: BUN: 24 mg/dL — ABNORMAL HIGH (ref 6–23)

## 2013-12-19 LAB — CREATININE, SERUM: Creat: 1.87 mg/dL — ABNORMAL HIGH (ref 0.50–1.35)

## 2013-12-21 ENCOUNTER — Ambulatory Visit (INDEPENDENT_AMBULATORY_CARE_PROVIDER_SITE_OTHER)
Admission: RE | Admit: 2013-12-21 | Discharge: 2013-12-21 | Disposition: A | Discharge status: Home / Self Care | Payer: Medicare Other | Source: Ambulatory Visit | Attending: Surgery | Admitting: Surgery

## 2013-12-21 ENCOUNTER — Ambulatory Visit
Admission: RE | Admit: 2013-12-21 | Discharge: 2013-12-21 | Disposition: A | Discharge status: Home / Self Care | Payer: Medicare Other | Source: Ambulatory Visit | Attending: Surgery | Admitting: Surgery

## 2013-12-21 ENCOUNTER — Ambulatory Visit (INDEPENDENT_AMBULATORY_CARE_PROVIDER_SITE_OTHER): Payer: Medicare Other | Admitting: Surgery

## 2013-12-21 ENCOUNTER — Other Ambulatory Visit: Payer: Self-pay | Admitting: *Deleted

## 2013-12-21 ENCOUNTER — Ambulatory Visit (HOSPITAL_COMMUNITY)
Admission: RE | Admit: 2013-12-21 | Discharge: 2013-12-21 | Disposition: A | Discharge status: Home / Self Care | Payer: Medicare Other | Source: Ambulatory Visit | Attending: Surgery | Admitting: Surgery

## 2013-12-21 ENCOUNTER — Encounter: Payer: Self-pay | Admitting: Surgery

## 2013-12-21 VITALS — BP 138/90 | HR 85 | Ht 68.5 in | Wt 155.0 lb

## 2013-12-21 DIAGNOSIS — Z01810 Encounter for preprocedural cardiovascular examination: Secondary | ICD-10-CM

## 2013-12-21 DIAGNOSIS — I714 Abdominal aortic aneurysm, without rupture, unspecified: Secondary | ICD-10-CM

## 2013-12-21 NOTE — Addendum Note (Signed)
Addended by: Mena Goes on: 12/21/2013 04:57 PM   Modules accepted: Orders

## 2013-12-21 NOTE — Progress Notes (Signed)
Patient name: Ruben Blackwell MRN: [id number] DOB: 10-20-1955 Sex: male     Chief Complaint  Patient presents with  . Re-evaluation    6 month f/u     HISTORY OF PRESENT ILLNESS: Patient is back today for followup of his abdominal aortic aneurysm.  This was detected as an incidental finding on an abdominal ultrasound.  It measured 4.7 cm.  The patient suffers from Parkinson disease and schizoaffective disorder.  He has tardive dyskinesia.  He is also a heavy smoker and has COPD and is on oxygen.  Past Medical History  Diagnosis Date  . Impaired glucose tolerance 08/27/2011  . BIPOLAR DISORDER UNSPECIFIED 05/25/2009  . COPD 08/22/2010  . CORONARY ARTERY DISEASE 10/20/2009  . HYPERLIPIDEMIA 05/20/2007  . PARKINSON'S DISEASE 05/25/2009  . 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 TARDIVE DYSKINESIA 05/20/2007  . DEPRESSION 08/18/2008  . GERD 12/01/2008  . HYPERTHYROIDISM 08/18/2008  . Unspecified chronic bronchitis 10/09/2012  . Chronic kidney disease   . Peripheral vascular disease   . Barrett's esophagus 10/26/13    Dx EGD 10/2013  . Personal history of colonic polyps-adenomas/ssp 10/26/13  . Diverticulosis 10/26/13    mild    Past Surgical History  Procedure Laterality Date  . Cardiac catheterization    . Spine surgery    . Hernia repair    . Esophagogastroduodenoscopy    . Colonoscopy w/ biopsies      History   Social History  . Marital Status: Divorced    Spouse Name: N/A    Number of Children: N/A  . Years of Education: N/A   Occupational History  . Not on file.   Social History Main Topics  . Smoking status: Heavy Tobacco Smoker -- 2.50 packs/day for 40 years    Types: Cigarettes  . Smokeless tobacco: Never Used  . Alcohol Use: Yes     Comment: rarely  . Drug Use: No  . Sexual Activity: Not on file   Other Topics Concern  . Not on file   Social History Narrative  . No narrative on file    Family History  Problem Relation Age of Onset  . Hyperlipidemia  Mother   . Hypertension Mother   . Varicose Veins Mother   . Diabetes Father   . Heart disease Father     before age 53  . Heart attack Father   . Cancer Sister   . Hyperlipidemia Sister   . Hypertension Sister   . Cancer Brother   . Diabetes Brother   . Hyperlipidemia Brother   . Hypertension Brother     Allergies as of 12/21/2013 - Review Complete 12/21/2013  Allergen Reaction Noted  . Sulfonamide derivatives      Current Outpatient Prescriptions on File Prior to Visit  Medication Sig Dispense Refill  . aluminum hydroxide-magnesium carbonate (GAVISCON) 95-358 MG/15ML SUSP Take 30 mLs by mouth at bedtime. 2 tablespoons    0  . divalproex (DEPAKOTE) 500 MG DR tablet Take 1 tablet (500 mg total) by mouth 2 (two) times daily. 2 by mouth every evening  180 tablet  3  . FLUoxetine (PROZAC) 20 MG capsule Take 1 capsule (20 mg total) by mouth daily.  90 capsule  3  . haloperidol (HALDOL) 1 MG tablet Take 2 mg by mouth daily.      Marland Kitchen omeprazole (PRILOSEC) 40 MG capsule Take 1 capsule (40 mg total) by mouth daily before supper.  30 capsule  11  . ondansetron (ZOFRAN) 4 MG  tablet Take 1 tablet (4 mg total) by mouth every 8 (eight) hours as needed for nausea.  40 tablet  0  . simvastatin (ZOCOR) 40 MG tablet TAKE ONE TABLET BY MOUTH ONCE DAILY  90 tablet  3   No current facility-administered medications on file prior to visit.     REVIEW OF SYSTEMS: No changes from prior visit  PHYSICAL EXAMINATION:   Vital signs are BP 138/90  Pulse 85  Ht 5' 8.5" (1.74 m)  Wt 155 lb (70.308 kg)  BMI 23.22 kg/m2  SpO2 97% General: The patient appears their stated age. HEENT:  No gross abnormalities Pulmonary:  Non labored breathing Abdomen: Soft and non-tender Musculoskeletal: There are no major deformities. Neurologic: No focal weakness or paresthesias are detected, Skin: There are no ulcer or rashes noted. Psychiatric: The patient has normal affect. Cardiovascular: There is a regular rate  and rhythm without significant murmur appreciated.   Diagnostic Studies Carotid duplex: Less than 40% stenosis bilaterally. Lower extremity ultrasound: No evidence of popliteal aneurysm ABI: 1.3 on the right 1.4 on the left, both triphasic  CT scan: Bilobed infrarenal aneurysm with maximum diameter of 6.3 cm.  He has a 3 x 4 mm right lower lobe lung nodule.  One year followup CT scan is recommended  Assessment: Abdominal aortic aneurysm Plan: I elected to get the CT scan today without contrast, as the patient's creatinine was 1.8.  The patient was found to have a 6.3 cm aneurysm.  I suspect that the ultrasound did not detect this 6 months ago.  I doubt that it has grown this months in the past 6 months.  Regardless, I am not sure if the patient is a candidate for endovascular infrarenal repair.  He is going to have to have another CT scan, this time with contrast, so I can better define his anatomy.  He is currently asymptomatic.  The patient has a significant smoking history and is on home oxygen.  He will need formal pulmonary clearance before proceeding with surgery.  In addition a 3 x 4 mm right lower lobe lung nodule was detected.  L. as pulmonary to follow this.  He'll need a CT scan in one year  The patient will followup with me one week from today.  I have encouraged him to stop drinking as much patency and to hydrate himself with water between now and the time of his CT scan  V. Leia Alf, M.D. [facility] Office: [phone] Pager:  [phone]

## 2013-12-23 ENCOUNTER — Encounter: Payer: Self-pay | Admitting: Pulmonary Disease

## 2013-12-23 ENCOUNTER — Ambulatory Visit: Payer: Medicare Other | Admitting: Internal Medicine

## 2013-12-23 ENCOUNTER — Ambulatory Visit (INDEPENDENT_AMBULATORY_CARE_PROVIDER_SITE_OTHER): Payer: Medicare Other | Admitting: Pulmonary Disease

## 2013-12-23 VITALS — BP 130/84 | HR 84 | Temp 98.4°F | Ht 68.5 in | Wt 156.6 lb

## 2013-12-23 DIAGNOSIS — R0989 Other specified symptoms and signs involving the circulatory and respiratory systems: Secondary | ICD-10-CM

## 2013-12-23 DIAGNOSIS — R911 Solitary pulmonary nodule: Secondary | ICD-10-CM | POA: Insufficient documentation

## 2013-12-23 DIAGNOSIS — R0609 Other forms of dyspnea: Secondary | ICD-10-CM | POA: Insufficient documentation

## 2013-12-23 DIAGNOSIS — R918 Other nonspecific abnormal finding of lung field: Secondary | ICD-10-CM

## 2013-12-23 NOTE — Progress Notes (Signed)
   Subjective:    Patient ID: Ruben Blackwell, male    DOB: 09-15-1955, 58 y.o.   MRN: [id number]  HPI The patient is a 58 year old male who I've been asked to see for pulmonary clearance for possible AAA repair. The patient has a long history of smoking, and continues to do so. He describes a one block dyspnea on exertion at a moderate pace on flat ground, and will also get winded walking up one flight of stairs. He can get short of breath bringing groceries in from the car, or carrying any type of load. He has some cough with intermittent mucus, but it is nonpurulent. He denies any issues with recurrent chest infections, and has not had chronic lower extremity edema. He has had a recent CT of his chest which shows a 4 mm nodule in the right lower lobe, otherwise there was no acute process in the chest. He has never had pulmonary function studies.   Review of Systems  Constitutional: Negative for fever and unexpected weight change.  HENT: Positive for congestion and postnasal drip. Negative for dental problem, ear pain, nosebleeds, rhinorrhea, sinus pressure, sneezing, sore throat and trouble swallowing.   Eyes: Negative for redness and itching.  Respiratory: Positive for cough and shortness of breath. Negative for chest tightness and wheezing.   Cardiovascular: Negative for palpitations and leg swelling.  Gastrointestinal: Negative for nausea and vomiting.  Genitourinary: Negative for dysuria.  Musculoskeletal: Negative for joint swelling.  Skin: Negative for rash.  Neurological: Negative for headaches.  Hematological: Does not bruise/bleed easily.  Psychiatric/Behavioral: Negative for dysphoric mood. The patient is not nervous/anxious.        Objective:   Physical Exam Constitutional:  Thin male, no acute distress  HENT:  Nares patent without discharge  Oropharynx without exudate, palate and uvula are normal  Eyes:  Perrla, eomi, no scleral icterus  Neck:  No JVD, no  TMG  Cardiovascular:  Normal rate, regular rhythm, no rubs or gallops.  No murmurs        Intact distal pulses but decreased.  Pulmonary :  decreased breath sounds, no stridor or respiratory distress   No rales, rhonchi, or wheezing  Abdominal:  Soft, nondistended, bowel sounds present.  No tenderness noted.   Musculoskeletal:  No lower extremity edema noted.  Lymph Nodes:  No cervical lymphadenopathy noted  Skin:  No cyanosis noted  Neurologic:  Alert, appropriate, moves all 4 extremities without obvious deficit. +movement d/o of some type.          Assessment & Plan:

## 2013-12-23 NOTE — Patient Instructions (Signed)
Will schedule for breathing studies asap, and let you know the results. The most important thing you can do to reduce your risk for surgery is total smoking cessation. You will need a repeat scan of your chest down the road to followup the lung spots we discussed.

## 2013-12-23 NOTE — Assessment & Plan Note (Signed)
The patient has a 4 mm nodule in his right lower lobe on recent CT chest, and will need a followup in one year.

## 2013-12-23 NOTE — Assessment & Plan Note (Signed)
The patient has significant dyspnea on exertion by history that I suspect is related to significant COPD. Unfortunately, he continues to smoke, and I have counseled him today about smoking cessation. He will need full pulmonary function studies for a risk assessment for upcoming surgery. I have stressed to the patient the importance of total smoking cessation, and this will reduce his postoperative risk more than anything else.

## 2013-12-24 ENCOUNTER — Ambulatory Visit (INDEPENDENT_AMBULATORY_CARE_PROVIDER_SITE_OTHER): Payer: Medicare Other | Admitting: Pulmonary Disease

## 2013-12-24 ENCOUNTER — Ambulatory Visit
Admission: RE | Admit: 2013-12-24 | Discharge: 2013-12-24 | Disposition: A | Discharge status: Home / Self Care | Payer: Medicare Other | Source: Ambulatory Visit | Attending: Surgery | Admitting: Surgery

## 2013-12-24 DIAGNOSIS — R0989 Other specified symptoms and signs involving the circulatory and respiratory systems: Secondary | ICD-10-CM

## 2013-12-24 DIAGNOSIS — I714 Abdominal aortic aneurysm, without rupture, unspecified: Secondary | ICD-10-CM

## 2013-12-24 DIAGNOSIS — Z01810 Encounter for preprocedural cardiovascular examination: Secondary | ICD-10-CM

## 2013-12-24 DIAGNOSIS — R0609 Other forms of dyspnea: Secondary | ICD-10-CM

## 2013-12-24 MED ORDER — IOHEXOL 350 MG/ML SOLN
40.0000 mL | Freq: Once | INTRAVENOUS | Status: AC | PRN
  Administered 2013-12-24: 40 mL via INTRAVENOUS

## 2013-12-24 NOTE — Progress Notes (Signed)
Spirometry before and after done today. 

## 2013-12-25 ENCOUNTER — Encounter: Payer: Self-pay | Admitting: Surgery

## 2013-12-28 ENCOUNTER — Ambulatory Visit (INDEPENDENT_AMBULATORY_CARE_PROVIDER_SITE_OTHER): Payer: Medicare Other | Admitting: Surgery

## 2013-12-28 ENCOUNTER — Encounter: Payer: Self-pay | Admitting: Surgery

## 2013-12-28 ENCOUNTER — Telehealth: Payer: Self-pay | Admitting: Pulmonary Disease

## 2013-12-28 ENCOUNTER — Other Ambulatory Visit: Payer: Self-pay

## 2013-12-28 VITALS — BP 130/107 | HR 91 | Ht 68.5 in | Wt 152.0 lb

## 2013-12-28 DIAGNOSIS — I714 Abdominal aortic aneurysm, without rupture, unspecified: Secondary | ICD-10-CM

## 2013-12-28 DIAGNOSIS — Z01818 Encounter for other preprocedural examination: Secondary | ICD-10-CM

## 2013-12-28 NOTE — Progress Notes (Signed)
Patient name: Ruben Blackwell MRN: [id number] DOB: [birth year] Sex: male     Chief Complaint  Patient presents with  . Re-evaluation    pre op CTA prior    HISTORY OF PRESENT ILLNESS: The patient is back today for followup of his aneurysm.  He reports no interval change from his prior visit.  He was seen and evaluated by Dr. Gwenette Greet with pulmonary.  His final evaluation is still pending.  The patient has tried to cut back on smoking but has not been a very good job with it.  Past Medical History  Diagnosis Date  . Impaired glucose tolerance 08/27/2011  . BIPOLAR DISORDER UNSPECIFIED 05/25/2009  . COPD 08/22/2010  . CORONARY ARTERY DISEASE 10/20/2009  . HYPERLIPIDEMIA 05/20/2007  . PARKINSON'S DISEASE 05/25/2009  . 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 TARDIVE DYSKINESIA 05/20/2007  . DEPRESSION 08/18/2008  . GERD 12/01/2008  . HYPERTHYROIDISM 08/18/2008  . Unspecified chronic bronchitis 10/09/2012  . Chronic kidney disease   . Peripheral vascular disease   . Barrett's esophagus 10/26/13    Dx EGD 10/2013  . Personal history of colonic polyps-adenomas/ssp 10/26/13  . Diverticulosis 10/26/13    mild    Past Surgical History  Procedure Laterality Date  . Cardiac catheterization    . Spine surgery    . Hernia repair    . Esophagogastroduodenoscopy    . Colonoscopy w/ biopsies      History   Social History  . Marital Status: Divorced    Spouse Name: N/A    Number of Children: N/A  . Years of Education: N/A   Occupational History  . disabled    Social History Main Topics  . Smoking status: Current Every Day Smoker -- 2.50 packs/day for 40 years    Types: Cigarettes  . Smokeless tobacco: Never Used  . Alcohol Use: Yes     Comment: rarely  . Drug Use: No  . Sexual Activity: Not on file   Other Topics Concern  . Not on file   Social History Narrative  . No narrative on file    Family History  Problem Relation Age of Onset  . Hyperlipidemia Mother   .  Hypertension Mother   . Varicose Veins Mother   . Diabetes Father   . Heart disease Father     before age 10  . Heart attack Father   . Cancer Sister   . Hyperlipidemia Sister   . Hypertension Sister   . Cancer Brother   . Diabetes Brother   . Hyperlipidemia Brother   . Hypertension Brother     Allergies as of 12/28/2013 - Review Complete 12/28/2013  Allergen Reaction Noted  . Sulfonamide derivatives      Current Outpatient Prescriptions on File Prior to Visit  Medication Sig Dispense Refill  . aluminum hydroxide-magnesium carbonate (GAVISCON) 95-358 MG/15ML SUSP Take 30 mLs by mouth at bedtime. 2 tablespoons    0  . divalproex (DEPAKOTE) 500 MG DR tablet Take 1 tablet (500 mg total) by mouth 2 (two) times daily. 2 by mouth every evening  180 tablet  3  . FLUoxetine (PROZAC) 20 MG capsule Take 1 capsule (20 mg total) by mouth daily.  90 capsule  3  . haloperidol (HALDOL) 1 MG tablet Take 2 mg by mouth daily.      Marland Kitchen omeprazole (PRILOSEC) 40 MG capsule Take 1 capsule (40 mg total) by mouth daily before supper.  30 capsule  11  . ondansetron (ZOFRAN) 4  MG tablet Take 1 tablet (4 mg total) by mouth every 8 (eight) hours as needed for nausea.  40 tablet  0  . simvastatin (ZOCOR) 40 MG tablet TAKE ONE TABLET BY MOUTH ONCE DAILY  90 tablet  3   No current facility-administered medications on file prior to visit.     REVIEW OF SYSTEMS: No changes from his visit last week  PHYSICAL EXAMINATION:   Vital signs are BP 130/107  Pulse 91  Ht 5' 8.5" (1.74 m)  Wt 152 lb (68.947 kg)  BMI 22.77 kg/m2  SpO2 97% General: The patient appears their stated age. HEENT:  No gross abnormalities Pulmonary:  Non labored breathing Abdomen: Soft and non-tender Musculoskeletal: There are no major deformities. Neurologic: No focal weakness or paresthesias are detected, Skin: There are no ulcer or rashes noted. Psychiatric: The patient has normal affect. Cardiovascular: There is a regular rate  and rhythm without significant murmur appreciated.   Diagnostic Studies I have reviewed his CT angiogram.  This shows a 6.4 cm bilobed abdominal aortic aneurysm beginning just below the level of the renal arteries.  Assessment: Abdominal aortic aneurysm Plan: After reviewing the patient's CT angiogram, I do not think he is a candidate for fenestrated endovascular repair.  I think that there is not enough distance between the superior mesenteric artery and the renal arteries to create the appropriate device.  Therefore, I think he needs to undergo open repair.  I discussed this with the patient.  We discussed the risks and benefits.  We discussed the potential complications including death, cardiopulmonary complications, prolonged intubation and possibly tracheostomy, renal dysfunction, intestinal ischemia, lower extremity ischemia, sexual dysfunction.  All the patient's questions were answered.  I have tentatively put him on the schedule for Thursday, June 4 for open aneurysm repair.  He will need official clearance from pulmonary before proceeding.  Eldridge Abrahams, M.D. [facility] Office: [phone] Pager:  [phone]

## 2013-12-28 NOTE — Telephone Encounter (Signed)
Pt is scheduled for triple A on 01/14/14. Needing clearance from [facility] faxed to [phone]. According to last OV 12/23/13. Pt needed PFT's. He had this done 12/24/13. Please advise [place] thanks

## 2013-12-28 NOTE — Telephone Encounter (Signed)
Appt scheduled with [facility] 12/29/13 at 10:30   Nothing further needed.

## 2013-12-28 NOTE — Telephone Encounter (Signed)
He needs ov with me tomorrow or wed am if possible to review pfts and get started on meds.

## 2013-12-28 NOTE — Telephone Encounter (Signed)
lmomtcb x1 for bonnie

## 2013-12-28 NOTE — Addendum Note (Signed)
Addended by: Mena Goes on: 12/28/2013 10:32 AM   Modules accepted: Orders

## 2013-12-28 NOTE — Telephone Encounter (Signed)
Horris Latino returning call

## 2013-12-28 NOTE — Telephone Encounter (Signed)
Called Bon[facility] at [facility].  LMTCB X1 PT WAS SEEN 12/23/13

## 2013-12-28 NOTE — Telephone Encounter (Signed)
Ruben Blackwell calling back again [phone]

## 2013-12-29 ENCOUNTER — Ambulatory Visit (INDEPENDENT_AMBULATORY_CARE_PROVIDER_SITE_OTHER): Payer: Medicare Other | Admitting: Pulmonary Disease

## 2013-12-29 ENCOUNTER — Encounter: Payer: Self-pay | Admitting: Pulmonary Disease

## 2013-12-29 VITALS — BP 120/82 | HR 89 | Temp 98.2°F | Ht 68.5 in | Wt 154.0 lb

## 2013-12-29 DIAGNOSIS — J449 Chronic obstructive pulmonary disease, unspecified: Secondary | ICD-10-CM

## 2013-12-29 DIAGNOSIS — I2510 Atherosclerotic heart disease of native coronary artery without angina pectoris: Secondary | ICD-10-CM

## 2013-12-29 NOTE — Patient Instructions (Signed)
You must stop smoking leading up to your surgery if able. Will start on dulera 100/5  2 inhalations am and pm everyday.  Rinse mouth out. Start on spiriva 2 inhalations each am everyday.   Use proair 2 inhalations up to every 6hrs only if needed for emergencies. followup with me after the surgery.

## 2013-12-29 NOTE — Progress Notes (Signed)
   Subjective:    Patient ID: Ruben Blackwell, male    DOB: 27-Dec-1955, 58 y.o.   MRN: [id number]  HPI The patient comes in today for followup after his recent pulmonary function studies. He is scheduled for upcoming AAA repair, and require surgical clearance. His PFTs showed severe airflow obstruction, but did have a 20% improvement in his FVC with bronchodilators. He was not able to do lung volumes or diffusion capacity.   Review of Systems  Constitutional: Negative for fever and unexpected weight change.  HENT: Negative for congestion, dental problem, ear pain, nosebleeds, postnasal drip, rhinorrhea, sinus pressure, sneezing, sore throat and trouble swallowing.   Eyes: Negative for redness and itching.  Respiratory: Positive for cough and shortness of breath. Negative for chest tightness and wheezing.   Cardiovascular: Negative for palpitations and leg swelling.  Gastrointestinal: Negative for nausea and vomiting.  Genitourinary: Negative for dysuria.  Musculoskeletal: Negative for joint swelling.  Skin: Negative for rash.  Neurological: Negative for headaches.  Hematological: Does not bruise/bleed easily.  Psychiatric/Behavioral: Negative for dysphoric mood. The patient is not nervous/anxious.        Objective:   Physical Exam Thin male in no acute distress Nose without purulence or discharge noted Neck without lymphadenopathy or thyromegaly Chest with rhonchi and decreased breath sounds, no active wheezing Lower extremities without edema, no cyanosis Alert and oriented, moves all 4 extremities.       Assessment & Plan:

## 2013-12-29 NOTE — Assessment & Plan Note (Signed)
The patient has very severe airflow obstruction on his PFTs, most consistent with emphysema based on his extensive smoking history. At least he did have some response to bronchodilators. I would like to start him on an aggressive bronchodilator regimen, and I also stressed to him the importance of total smoking cessation leading up to his surgery. With respect to his upcoming AAA surgery, he is at fairly high risk for postop pulmonary complications. This can include pneumonia, and as well as prolonged ventilator dependence. There is no absolute contraindication to the surgery.

## 2014-01-05 ENCOUNTER — Encounter (HOSPITAL_COMMUNITY): Payer: Self-pay | Admitting: Pharmacy Technician

## 2014-01-07 ENCOUNTER — Encounter (HOSPITAL_COMMUNITY): Payer: Self-pay

## 2014-01-07 ENCOUNTER — Ambulatory Visit (HOSPITAL_COMMUNITY)
Admission: RE | Admit: 2014-01-07 | Discharge: 2014-01-07 | Disposition: A | Discharge status: Home / Self Care | Payer: Medicare Other | Source: Ambulatory Visit | Attending: Anesthesiology | Admitting: Anesthesiology

## 2014-01-07 ENCOUNTER — Encounter (HOSPITAL_COMMUNITY)
Admission: RE | Admit: 2014-01-07 | Discharge: 2014-01-07 | Disposition: A | Discharge status: Home / Self Care | Payer: Medicare Other | Source: Ambulatory Visit | Attending: Surgery | Admitting: Surgery

## 2014-01-07 DIAGNOSIS — I10 Essential (primary) hypertension: Secondary | ICD-10-CM | POA: Diagnosis not present

## 2014-01-07 DIAGNOSIS — Z01818 Encounter for other preprocedural examination: Secondary | ICD-10-CM | POA: Insufficient documentation

## 2014-01-07 DIAGNOSIS — Z01812 Encounter for preprocedural laboratory examination: Secondary | ICD-10-CM | POA: Insufficient documentation

## 2014-01-07 DIAGNOSIS — R0602 Shortness of breath: Secondary | ICD-10-CM | POA: Insufficient documentation

## 2014-01-07 HISTORY — DX: Shortness of breath: R06.02

## 2014-01-07 HISTORY — DX: Essential (primary) hypertension: I10

## 2014-01-07 HISTORY — DX: Other specified postprocedural states: Z98.890

## 2014-01-07 HISTORY — DX: Other specified postprocedural states: R11.2

## 2014-01-07 LAB — CBC
HCT: 35.2 % — ABNORMAL LOW (ref 39.0–52.0)
Hemoglobin: 12.4 g/dL — ABNORMAL LOW (ref 13.0–17.0)
MCH: 34.1 pg — ABNORMAL HIGH (ref 26.0–34.0)
MCHC: 35.2 g/dL (ref 30.0–36.0)
MCV: 96.7 fL (ref 78.0–100.0)
Platelets: 270 10*3/uL (ref 150–400)
RBC: 3.64 MIL/uL — ABNORMAL LOW (ref 4.22–5.81)
RDW: 12.9 % (ref 11.5–15.5)
WBC: 5.9 10*3/uL (ref 4.0–10.5)

## 2014-01-07 LAB — COMPREHENSIVE METABOLIC PANEL
ALK PHOS: 70 U/L (ref 39–117)
ALT: 13 U/L (ref 0–53)
AST: 17 U/L (ref 0–37)
Albumin: 3.2 g/dL — ABNORMAL LOW (ref 3.5–5.2)
BILIRUBIN TOTAL: 0.2 mg/dL — AB (ref 0.3–1.2)
BUN: 20 mg/dL (ref 6–23)
CO2: 23 mEq/L (ref 19–32)
Calcium: 9 mg/dL (ref 8.4–10.5)
Chloride: 100 mEq/L (ref 96–112)
Creatinine, Ser: 2.15 mg/dL — ABNORMAL HIGH (ref 0.50–1.35)
GFR calc Af Amer: 38 mL/min — ABNORMAL LOW (ref >90)
GFR calc non Af Amer: 33 mL/min — ABNORMAL LOW (ref >90)
Glucose, Bld: 80 mg/dL (ref 70–99)
POTASSIUM: 3.7 meq/L (ref 3.7–5.3)
Sodium: 136 mEq/L — ABNORMAL LOW (ref 137–147)
TOTAL PROTEIN: 7.1 g/dL (ref 6.0–8.3)

## 2014-01-07 LAB — BLOOD GAS, ARTERIAL
Acid-Base Excess: 0.1 mmol/L (ref 0.0–2.0)
Bicarbonate: 24.5 mEq/L — ABNORMAL HIGH (ref 20.0–24.0)
Drawn by: 181601
FIO2: 0.21 %
O2 Saturation: 97.6 %
PCO2 ART: 41.5 mmHg (ref 35.0–45.0)
PH ART: 7.389 (ref 7.350–7.450)
Patient temperature: 98.6
TCO2: 25.8 mmol/L (ref 0–100)
pO2, Arterial: 97.3 mmHg (ref 80.0–100.0)

## 2014-01-07 LAB — SURGICAL PCR SCREEN
MRSA, PCR: NEGATIVE
Staphylococcus aureus: NEGATIVE

## 2014-01-07 LAB — URINALYSIS, ROUTINE W REFLEX MICROSCOPIC
BILIRUBIN URINE: NEGATIVE
Glucose, UA: NEGATIVE mg/dL
HGB URINE DIPSTICK: NEGATIVE
Ketones, ur: NEGATIVE mg/dL
Leukocytes, UA: NEGATIVE
NITRITE: NEGATIVE
PROTEIN: 100 mg/dL — AB
Specific Gravity, Urine: 1.01 (ref 1.005–1.030)
UROBILINOGEN UA: 0.2 mg/dL (ref 0.0–1.0)
pH: 6.5 (ref 5.0–8.0)

## 2014-01-07 LAB — URINE MICROSCOPIC-ADD ON

## 2014-01-07 LAB — PROTIME-INR
INR: 0.86 (ref 0.00–1.49)
PROTHROMBIN TIME: 11.6 s (ref 11.6–15.2)

## 2014-01-07 LAB — APTT: aPTT: 27 seconds (ref 24–37)

## 2014-01-07 LAB — ABO/RH: ABO/RH(D): O POS

## 2014-01-07 MED ORDER — CHLORHEXIDINE GLUCONATE CLOTH 2 % EX PADS
6.0000 | MEDICATED_PAD | Freq: Once | CUTANEOUS | Status: DC
Start: 2014-01-07 — End: 2014-01-08

## 2014-01-07 MED ORDER — CHLORHEXIDINE GLUCONATE CLOTH 2 % EX PADS: 6.0000 | MEDICATED_PAD | Freq: Once | CUTANEOUS | Status: DC

## 2014-01-07 NOTE — Pre-Procedure Instructions (Signed)
Ruben Blackwell  01/07/2014   Your procedure is scheduled on:  Thursday, June 4th  Report to [facility] Admitting at 0530 AM.  Call this number if you have problems the morning of surgery: [phone]   Remember:   Do not eat food or drink liquids after midnight.   Take these medicines the morning of surgery with A SIP OF WATER: dulera, spirivia   Do not wear jewelry.  Do not wear lotions, powders, or perfumes. You may wear deodorant.  Do not shave 48 hours prior to surgery. Men may shave face and neck.  Do not bring valuables to the hospital.  [facility] is not responsible   for any belongings or valuables.               Contacts, dentures or bridgework may not be worn into surgery.  Leave suitcase in the car. After surgery it may be brought to your room.  For patients admitted to the hospital, discharge time is determined by your  treatment team.  Please read over the following fact sheets that you were given: Pain Booklet, Coughing and Deep Breathing, Blood Transfusion Information, MRSA Information and Surgical Site Infection Prevention [facility] - Preparing for Surgery  Before surgery, you can play an important role.  Because skin is not sterile, your skin needs to be as free of germs as possible.  You can reduce the number of germs on you skin by washing with CHG (chlorahexidine gluconate) soap before surgery.  CHG is an antiseptic cleaner which kills germs and bonds with the skin to continue killing germs even after washing.  Please DO NOT use if you have an allergy to CHG or antibacterial soaps.  If your skin becomes reddened/irritated stop using the CHG and inform your nurse when you arrive at Short Stay.  Do not shave (including legs and underarms) for at least 48 hours prior to the first CHG shower.  You may shave your face.  Please follow these instructions carefully:   1.  Shower with CHG Soap the night before surgery and the morning of Surgery.  2.  If  you choose to wash your hair, wash your hair first as usual with your normal shampoo.  3.  After you shampoo, rinse your hair and body thoroughly to remove the shampoo.  4.  Use CHG as you would any other liquid soap.  You can apply CHG directly to the skin and wash gently with scrungie or a clean washcloth.  5.  Apply the CHG Soap to your body ONLY FROM THE NECK DOWN.  Do not use on open wounds or open sores.  Avoid contact with your eyes, ears, mouth and genitals (private parts).  Wash genitals (private parts) with your normal soap.  6.  Wash thoroughly, paying special attention to the area where your surgery will be performed.  7.  Thoroughly rinse your body with warm water from the neck down.  8.  DO NOT shower/wash with your normal soap after using and rinsing off the CHG Soap.  9.  Pat yourself dry with a clean towel.            10.  Wear clean pajamas.            11.  Place clean sheets on your bed the night of your first shower and do not sleep with pets.  Day of Surgery  Do not apply any lotions/deoderants the morning of surgery.  Please wear clean  clothes to the hospital/surgery center.

## 2014-01-13 MED ORDER — DEXTROSE 5 % IV SOLN
1.5000 g | INTRAVENOUS | Status: AC
  Administered 2014-01-14: 1.5 g via INTRAVENOUS
  Filled 2014-01-13: qty 1.5

## 2014-01-13 MED ORDER — SODIUM CHLORIDE 0.9 % IV SOLN: INTRAVENOUS | Status: DC

## 2014-01-13 NOTE — Progress Notes (Signed)
Patient called to arive at 700 am

## 2014-01-14 ENCOUNTER — Encounter (HOSPITAL_COMMUNITY)
Admission: RE | Disposition: A | Discharge status: Rehabilitation Facility | Payer: Self-pay | Source: Ambulatory Visit | Attending: Surgery

## 2014-01-14 ENCOUNTER — Encounter (HOSPITAL_COMMUNITY): Payer: Medicare Other | Admitting: Anesthesiology

## 2014-01-14 ENCOUNTER — Inpatient Hospital Stay (HOSPITAL_COMMUNITY): Payer: Medicare Other

## 2014-01-14 ENCOUNTER — Encounter (HOSPITAL_COMMUNITY): Payer: Self-pay | Admitting: *Deleted

## 2014-01-14 ENCOUNTER — Inpatient Hospital Stay (HOSPITAL_COMMUNITY): Payer: Medicare Other | Admitting: Anesthesiology

## 2014-01-14 ENCOUNTER — Inpatient Hospital Stay (HOSPITAL_COMMUNITY)
Admission: RE | Admit: 2014-01-14 | Discharge: 2014-01-22 | DRG: 237 | Disposition: A | Discharge status: Rehabilitation Facility | Payer: Medicare Other | Source: Ambulatory Visit | Attending: Surgery | Admitting: Surgery

## 2014-01-14 DIAGNOSIS — F259 Schizoaffective disorder, unspecified: Secondary | ICD-10-CM | POA: Diagnosis present

## 2014-01-14 DIAGNOSIS — D62 Acute posthemorrhagic anemia: Secondary | ICD-10-CM | POA: Diagnosis not present

## 2014-01-14 DIAGNOSIS — E872 Acidosis, unspecified: Secondary | ICD-10-CM

## 2014-01-14 DIAGNOSIS — N183 Chronic kidney disease, stage 3 unspecified: Secondary | ICD-10-CM | POA: Diagnosis present

## 2014-01-14 DIAGNOSIS — K429 Umbilical hernia without obstruction or gangrene: Secondary | ICD-10-CM | POA: Diagnosis present

## 2014-01-14 DIAGNOSIS — G20A1 Parkinson's disease without dyskinesia, without mention of fluctuations: Secondary | ICD-10-CM | POA: Diagnosis present

## 2014-01-14 DIAGNOSIS — F329 Major depressive disorder, single episode, unspecified: Secondary | ICD-10-CM

## 2014-01-14 DIAGNOSIS — G244 Idiopathic orofacial dystonia: Secondary | ICD-10-CM

## 2014-01-14 DIAGNOSIS — Z9289 Personal history of other medical treatment: Secondary | ICD-10-CM

## 2014-01-14 DIAGNOSIS — I2510 Atherosclerotic heart disease of native coronary artery without angina pectoris: Secondary | ICD-10-CM | POA: Diagnosis present

## 2014-01-14 DIAGNOSIS — I714 Abdominal aortic aneurysm, without rupture, unspecified: Secondary | ICD-10-CM

## 2014-01-14 DIAGNOSIS — R5383 Other fatigue: Secondary | ICD-10-CM

## 2014-01-14 DIAGNOSIS — Z9981 Dependence on supplemental oxygen: Secondary | ICD-10-CM

## 2014-01-14 DIAGNOSIS — F3289 Other specified depressive episodes: Secondary | ICD-10-CM

## 2014-01-14 DIAGNOSIS — R5381 Other malaise: Secondary | ICD-10-CM | POA: Diagnosis present

## 2014-01-14 DIAGNOSIS — J95821 Acute postprocedural respiratory failure: Secondary | ICD-10-CM | POA: Diagnosis not present

## 2014-01-14 DIAGNOSIS — J449 Chronic obstructive pulmonary disease, unspecified: Secondary | ICD-10-CM | POA: Diagnosis present

## 2014-01-14 DIAGNOSIS — J690 Pneumonitis due to inhalation of food and vomit: Secondary | ICD-10-CM | POA: Diagnosis not present

## 2014-01-14 DIAGNOSIS — J439 Emphysema, unspecified: Secondary | ICD-10-CM

## 2014-01-14 DIAGNOSIS — F17200 Nicotine dependence, unspecified, uncomplicated: Secondary | ICD-10-CM | POA: Diagnosis present

## 2014-01-14 DIAGNOSIS — I739 Peripheral vascular disease, unspecified: Secondary | ICD-10-CM | POA: Diagnosis present

## 2014-01-14 DIAGNOSIS — Z8249 Family history of ischemic heart disease and other diseases of the circulatory system: Secondary | ICD-10-CM

## 2014-01-14 DIAGNOSIS — E785 Hyperlipidemia, unspecified: Secondary | ICD-10-CM | POA: Diagnosis present

## 2014-01-14 DIAGNOSIS — E876 Hypokalemia: Secondary | ICD-10-CM | POA: Diagnosis not present

## 2014-01-14 DIAGNOSIS — I10 Essential (primary) hypertension: Secondary | ICD-10-CM | POA: Diagnosis present

## 2014-01-14 DIAGNOSIS — G2401 Drug induced subacute dyskinesia: Secondary | ICD-10-CM | POA: Diagnosis present

## 2014-01-14 DIAGNOSIS — F319 Bipolar disorder, unspecified: Secondary | ICD-10-CM | POA: Diagnosis present

## 2014-01-14 DIAGNOSIS — J9600 Acute respiratory failure, unspecified whether with hypoxia or hypercapnia: Secondary | ICD-10-CM

## 2014-01-14 DIAGNOSIS — R32 Unspecified urinary incontinence: Secondary | ICD-10-CM | POA: Diagnosis present

## 2014-01-14 DIAGNOSIS — K219 Gastro-esophageal reflux disease without esophagitis: Secondary | ICD-10-CM | POA: Diagnosis present

## 2014-01-14 DIAGNOSIS — N189 Chronic kidney disease, unspecified: Secondary | ICD-10-CM

## 2014-01-14 DIAGNOSIS — J4489 Other specified chronic obstructive pulmonary disease: Secondary | ICD-10-CM | POA: Diagnosis present

## 2014-01-14 DIAGNOSIS — I129 Hypertensive chronic kidney disease with stage 1 through stage 4 chronic kidney disease, or unspecified chronic kidney disease: Secondary | ICD-10-CM | POA: Diagnosis present

## 2014-01-14 DIAGNOSIS — E870 Hyperosmolality and hypernatremia: Secondary | ICD-10-CM

## 2014-01-14 DIAGNOSIS — G20 Parkinson's disease: Secondary | ICD-10-CM | POA: Diagnosis present

## 2014-01-14 DIAGNOSIS — G9340 Encephalopathy, unspecified: Secondary | ICD-10-CM | POA: Diagnosis not present

## 2014-01-14 DIAGNOSIS — E0590 Thyrotoxicosis, unspecified without thyrotoxic crisis or storm: Secondary | ICD-10-CM | POA: Diagnosis present

## 2014-01-14 DIAGNOSIS — J9601 Acute respiratory failure with hypoxia: Secondary | ICD-10-CM

## 2014-01-14 DIAGNOSIS — N179 Acute kidney failure, unspecified: Secondary | ICD-10-CM

## 2014-01-14 HISTORY — DX: Abdominal aortic aneurysm, without rupture, unspecified: I71.40

## 2014-01-14 HISTORY — DX: Personal history of other medical treatment: Z92.89

## 2014-01-14 HISTORY — DX: Abdominal aortic aneurysm, without rupture: I71.4

## 2014-01-14 HISTORY — DX: Chronic kidney disease, stage 3 unspecified: N18.30

## 2014-01-14 HISTORY — PX: ABDOMINAL AORTIC ANEURYSM REPAIR: SHX42

## 2014-01-14 HISTORY — DX: Malignant (primary) neoplasm, unspecified: C80.1

## 2014-01-14 HISTORY — DX: Chronic kidney disease, stage 3 (moderate): N18.3

## 2014-01-14 HISTORY — DX: Anxiety disorder, unspecified: F41.9

## 2014-01-14 LAB — POCT I-STAT 7, (LYTES, BLD GAS, ICA,H+H)
Acid-base deficit: 3 mmol/L — ABNORMAL HIGH (ref 0.0–2.0)
BICARBONATE: 22.7 meq/L (ref 20.0–24.0)
Calcium, Ion: 1.05 mmol/L — ABNORMAL LOW (ref 1.12–1.23)
HEMATOCRIT: 28 % — AB (ref 39.0–52.0)
HEMOGLOBIN: 9.5 g/dL — AB (ref 13.0–17.0)
O2 Saturation: 100 %
PCO2 ART: 42.4 mmHg (ref 35.0–45.0)
PH ART: 7.337 — AB (ref 7.350–7.450)
PO2 ART: 225 mmHg — AB (ref 80.0–100.0)
Patient temperature: 37.2
Potassium: 4 mEq/L (ref 3.7–5.3)
Sodium: 142 mEq/L (ref 137–147)
TCO2: 24 mmol/L (ref 0–100)

## 2014-01-14 LAB — LACTIC ACID, PLASMA: Lactic Acid, Venous: 0.9 mmol/L (ref 0.5–2.2)

## 2014-01-14 LAB — BASIC METABOLIC PANEL
BUN: 23 mg/dL (ref 6–23)
CALCIUM: 7.4 mg/dL — AB (ref 8.4–10.5)
CO2: 22 meq/L (ref 19–32)
Chloride: 108 mEq/L (ref 96–112)
Creatinine, Ser: 2.11 mg/dL — ABNORMAL HIGH (ref 0.50–1.35)
GFR calc Af Amer: 39 mL/min — ABNORMAL LOW (ref >90)
GFR calc non Af Amer: 34 mL/min — ABNORMAL LOW (ref >90)
Glucose, Bld: 154 mg/dL — ABNORMAL HIGH (ref 70–99)
Potassium: 5.5 mEq/L — ABNORMAL HIGH (ref 3.7–5.3)
SODIUM: 139 meq/L (ref 137–147)

## 2014-01-14 LAB — CBC
HCT: 34.3 % — ABNORMAL LOW (ref 39.0–52.0)
HEMOGLOBIN: 11.6 g/dL — AB (ref 13.0–17.0)
MCH: 32.7 pg (ref 26.0–34.0)
MCHC: 33.8 g/dL (ref 30.0–36.0)
MCV: 96.6 fL (ref 78.0–100.0)
Platelets: 170 10*3/uL (ref 150–400)
RBC: 3.55 MIL/uL — AB (ref 4.22–5.81)
RDW: 14.6 % (ref 11.5–15.5)
WBC: 13.4 10*3/uL — AB (ref 4.0–10.5)

## 2014-01-14 LAB — APTT: APTT: 31 s (ref 24–37)

## 2014-01-14 LAB — PROTIME-INR
INR: 1.08 (ref 0.00–1.49)
PROTHROMBIN TIME: 13.8 s (ref 11.6–15.2)

## 2014-01-14 LAB — MAGNESIUM: Magnesium: 1.6 mg/dL (ref 1.5–2.5)

## 2014-01-14 LAB — PREPARE RBC (CROSSMATCH)

## 2014-01-14 LAB — TROPONIN I: Troponin I: <0.3 ng/mL (ref <0.30)

## 2014-01-14 SURGERY — ANEURYSM ABDOMINAL AORTIC REPAIR
Anesthesia: General | Site: Abdomen

## 2014-01-14 MED ORDER — METOPROLOL TARTRATE 1 MG/ML IV SOLN
2.0000 mg | INTRAVENOUS | Status: AC | PRN
  Administered 2014-01-15: 3 mg via INTRAVENOUS
  Administered 2014-01-15: 2.5 mg via INTRAVENOUS

## 2014-01-14 MED ORDER — SUCCINYLCHOLINE CHLORIDE 20 MG/ML IJ SOLN
INTRAMUSCULAR | Status: AC
  Filled 2014-01-14: qty 1

## 2014-01-14 MED ORDER — MAGNESIUM SULFATE 40 MG/ML IJ SOLN
2.0000 g | Freq: Every day | INTRAMUSCULAR | Status: DC | PRN
  Filled 2014-01-14: qty 50

## 2014-01-14 MED ORDER — SODIUM CHLORIDE 0.9 % IJ SOLN
INTRAMUSCULAR | Status: AC
  Filled 2014-01-14: qty 10

## 2014-01-14 MED ORDER — LIDOCAINE HCL (CARDIAC) 20 MG/ML IV SOLN
INTRAVENOUS | Status: AC
  Filled 2014-01-14: qty 5

## 2014-01-14 MED ORDER — PROPOFOL 10 MG/ML IV BOLUS
INTRAVENOUS | Status: DC | PRN
  Administered 2014-01-14: 50 mg via INTRAVENOUS
  Administered 2014-01-14: 120 mg via INTRAVENOUS

## 2014-01-14 MED ORDER — EPHEDRINE SULFATE 50 MG/ML IJ SOLN
INTRAMUSCULAR | Status: AC
  Filled 2014-01-14: qty 1

## 2014-01-14 MED ORDER — PANTOPRAZOLE SODIUM 40 MG IV SOLR
40.0000 mg | INTRAVENOUS | Status: DC
  Administered 2014-01-14 – 2014-01-17 (×4): 40 mg via INTRAVENOUS
  Filled 2014-01-14 (×6): qty 40

## 2014-01-14 MED ORDER — PROPOFOL 10 MG/ML IV EMUL
5.0000 ug/kg/min | INTRAVENOUS | Status: DC
  Filled 2014-01-14: qty 100

## 2014-01-14 MED ORDER — 0.9 % SODIUM CHLORIDE (POUR BTL) OPTIME
TOPICAL | Status: DC | PRN
  Administered 2014-01-14: 1000 mL

## 2014-01-14 MED ORDER — HALOPERIDOL LACTATE 5 MG/ML IJ SOLN
1.0000 mg | Freq: Four times a day (QID) | INTRAMUSCULAR | Status: DC | PRN
  Administered 2014-01-16 – 2014-01-18 (×4): 1 mg via INTRAVENOUS
  Filled 2014-01-14: qty 1
  Filled 2014-01-14: qty 0.2
  Filled 2014-01-14 (×3): qty 1

## 2014-01-14 MED ORDER — ROCURONIUM BROMIDE 100 MG/10ML IV SOLN
INTRAVENOUS | Status: DC | PRN
  Administered 2014-01-14: 10 mg via INTRAVENOUS
  Administered 2014-01-14: 50 mg via INTRAVENOUS
  Administered 2014-01-14: 5 mg via INTRAVENOUS
  Administered 2014-01-14 (×5): 10 mg via INTRAVENOUS
  Administered 2014-01-14: 5 mg via INTRAVENOUS

## 2014-01-14 MED ORDER — NEOSTIGMINE METHYLSULFATE 10 MG/10ML IV SOLN
INTRAVENOUS | Status: AC
  Filled 2014-01-14: qty 1

## 2014-01-14 MED ORDER — HYDROMORPHONE HCL PF 1 MG/ML IJ SOLN
0.2500 mg | INTRAMUSCULAR | Status: DC | PRN
  Administered 2014-01-15 (×5): 0.25 mg via INTRAVENOUS
  Filled 2014-01-14 (×5): qty 1

## 2014-01-14 MED ORDER — LIDOCAINE HCL (CARDIAC) 20 MG/ML IV SOLN
INTRAVENOUS | Status: DC | PRN
  Administered 2014-01-14: 100 mg via INTRAVENOUS

## 2014-01-14 MED ORDER — HEMOSTATIC AGENTS (NO CHARGE) OPTIME
TOPICAL | Status: DC | PRN
Start: 2014-01-14 — End: 2014-01-14
  Administered 2014-01-14 (×2): 1 via TOPICAL

## 2014-01-14 MED ORDER — ACETAMINOPHEN 325 MG PO TABS: 325.0000 mg | ORAL_TABLET | ORAL | Status: DC | PRN

## 2014-01-14 MED ORDER — POTASSIUM CHLORIDE CRYS ER 20 MEQ PO TBCR: 20.0000 meq | EXTENDED_RELEASE_TABLET | Freq: Every day | ORAL | Status: DC | PRN

## 2014-01-14 MED ORDER — ONDANSETRON HCL 4 MG/2ML IJ SOLN
INTRAMUSCULAR | Status: AC
  Filled 2014-01-14: qty 2

## 2014-01-14 MED ORDER — LABETALOL HCL 5 MG/ML IV SOLN
10.0000 mg | INTRAVENOUS | Status: DC | PRN
  Administered 2014-01-14 – 2014-01-16 (×2): 10 mg via INTRAVENOUS
  Filled 2014-01-14 (×4): qty 4

## 2014-01-14 MED ORDER — GLYCOPYRROLATE 0.2 MG/ML IJ SOLN
INTRAMUSCULAR | Status: AC
Start: 2014-01-14 — End: 2014-01-14
  Filled 2014-01-14: qty 3

## 2014-01-14 MED ORDER — SUCCINYLCHOLINE CHLORIDE 20 MG/ML IJ SOLN
INTRAMUSCULAR | Status: DC | PRN
  Administered 2014-01-14: 140 mg via INTRAVENOUS

## 2014-01-14 MED ORDER — SUFENTANIL CITRATE 50 MCG/ML IV SOLN
INTRAVENOUS | Status: AC
  Filled 2014-01-14: qty 1

## 2014-01-14 MED ORDER — ACETAMINOPHEN 650 MG RE SUPP: 325.0000 mg | RECTAL | Status: DC | PRN

## 2014-01-14 MED ORDER — MIDAZOLAM HCL 5 MG/5ML IJ SOLN
INTRAMUSCULAR | Status: DC | PRN
  Administered 2014-01-14: 2 mg via INTRAVENOUS

## 2014-01-14 MED ORDER — SODIUM CHLORIDE 0.9 % IR SOLN
Status: DC | PRN
  Administered 2014-01-14: 10:00:00

## 2014-01-14 MED ORDER — MEPERIDINE HCL 25 MG/ML IJ SOLN
6.2500 mg | INTRAMUSCULAR | Status: DC | PRN
Start: 2014-01-14 — End: 2014-01-14

## 2014-01-14 MED ORDER — ALBUMIN HUMAN 5 % IV SOLN
INTRAVENOUS | Status: DC | PRN
  Administered 2014-01-14 (×3): via INTRAVENOUS

## 2014-01-14 MED ORDER — ARTIFICIAL TEARS OP OINT
TOPICAL_OINTMENT | OPHTHALMIC | Status: DC | PRN
  Administered 2014-01-14: 1 via OPHTHALMIC

## 2014-01-14 MED ORDER — MAGNESIUM SULFATE 40 MG/ML IJ SOLN
2.0000 g | Freq: Once | INTRAMUSCULAR | Status: AC
  Administered 2014-01-14: 2 g via INTRAVENOUS
  Filled 2014-01-14: qty 50

## 2014-01-14 MED ORDER — ONDANSETRON HCL 4 MG/2ML IJ SOLN
4.0000 mg | Freq: Four times a day (QID) | INTRAMUSCULAR | Status: DC | PRN
  Administered 2014-01-15 – 2014-01-17 (×2): 4 mg via INTRAVENOUS
  Filled 2014-01-14 (×2): qty 2

## 2014-01-14 MED ORDER — PROPOFOL INFUSION 10 MG/ML OPTIME
INTRAVENOUS | Status: DC | PRN
  Administered 2014-01-14: 50 ug/kg/min via INTRAVENOUS

## 2014-01-14 MED ORDER — ALBUTEROL SULFATE (2.5 MG/3ML) 0.083% IN NEBU: 2.5000 mg | INHALATION_SOLUTION | RESPIRATORY_TRACT | Status: DC | PRN

## 2014-01-14 MED ORDER — PROTAMINE SULFATE 10 MG/ML IV SOLN
INTRAVENOUS | Status: DC | PRN
  Administered 2014-01-14: 50 mg via INTRAVENOUS

## 2014-01-14 MED ORDER — SUFENTANIL CITRATE 50 MCG/ML IV SOLN
INTRAVENOUS | Status: DC | PRN
  Administered 2014-01-14 (×3): 5 ug via INTRAVENOUS
  Administered 2014-01-14: 80 ug via INTRAVENOUS
  Administered 2014-01-14 (×2): 5 ug via INTRAVENOUS
  Administered 2014-01-14: 20 ug via INTRAVENOUS
  Administered 2014-01-14 (×5): 5 ug via INTRAVENOUS

## 2014-01-14 MED ORDER — NEOSTIGMINE METHYLSULFATE 10 MG/10ML IV SOLN
INTRAVENOUS | Status: DC | PRN
  Administered 2014-01-14: 5 mg via INTRAVENOUS

## 2014-01-14 MED ORDER — OXYCODONE HCL 5 MG/5ML PO SOLN: 5.0000 mg | Freq: Once | ORAL | Status: DC | PRN

## 2014-01-14 MED ORDER — KCL IN DEXTROSE-NACL 20-5-0.45 MEQ/L-%-% IV SOLN
INTRAVENOUS | Status: DC
  Administered 2014-01-14: 1000 mL via INTRAVENOUS
  Filled 2014-01-14 (×2): qty 1000

## 2014-01-14 MED ORDER — ONDANSETRON HCL 4 MG/2ML IJ SOLN: 4.0000 mg | Freq: Once | INTRAMUSCULAR | Status: DC | PRN

## 2014-01-14 MED ORDER — LABETALOL HCL 5 MG/ML IV SOLN
INTRAVENOUS | Status: DC | PRN
  Administered 2014-01-14: 5 mg via INTRAVENOUS

## 2014-01-14 MED ORDER — SODIUM CHLORIDE 0.9 % IV SOLN
INTRAVENOUS | Status: DC
  Administered 2014-01-14: 20:00:00 via INTRAVENOUS

## 2014-01-14 MED ORDER — ONDANSETRON HCL 4 MG/2ML IJ SOLN
INTRAMUSCULAR | Status: DC | PRN
  Administered 2014-01-14: 4 mg via INTRAVENOUS

## 2014-01-14 MED ORDER — HYDRALAZINE HCL 20 MG/ML IJ SOLN
10.0000 mg | INTRAMUSCULAR | Status: DC | PRN
  Administered 2014-01-15 (×2): 10 mg via INTRAVENOUS
  Filled 2014-01-14 (×3): qty 1

## 2014-01-14 MED ORDER — EPHEDRINE SULFATE 50 MG/ML IJ SOLN
INTRAMUSCULAR | Status: DC | PRN
  Administered 2014-01-14 (×2): 5 mg via INTRAVENOUS
  Administered 2014-01-14: 10 mg via INTRAVENOUS

## 2014-01-14 MED ORDER — PROPOFOL 10 MG/ML IV BOLUS
INTRAVENOUS | Status: AC
  Filled 2014-01-14: qty 20

## 2014-01-14 MED ORDER — TIOTROPIUM BROMIDE MONOHYDRATE 18 MCG IN CAPS
18.0000 ug | ORAL_CAPSULE | Freq: Every day | RESPIRATORY_TRACT | Status: DC
  Filled 2014-01-14: qty 5

## 2014-01-14 MED ORDER — GLYCOPYRROLATE 0.2 MG/ML IJ SOLN
INTRAMUSCULAR | Status: AC
  Filled 2014-01-14: qty 4

## 2014-01-14 MED ORDER — ROCURONIUM BROMIDE 50 MG/5ML IV SOLN
INTRAVENOUS | Status: AC
  Filled 2014-01-14: qty 1

## 2014-01-14 MED ORDER — SODIUM POLYSTYRENE SULFONATE 15 GM/60ML PO SUSP
15.0000 g | Freq: Once | ORAL | Status: AC
  Administered 2014-01-14: 15 g
  Filled 2014-01-14: qty 60

## 2014-01-14 MED ORDER — OXYCODONE HCL 5 MG PO TABS: 5.0000 mg | ORAL_TABLET | Freq: Once | ORAL | Status: DC | PRN

## 2014-01-14 MED ORDER — ALUM & MAG HYDROXIDE-SIMETH 200-200-20 MG/5ML PO SUSP: 15.0000 mL | ORAL | Status: DC | PRN

## 2014-01-14 MED ORDER — PROTAMINE SULFATE 10 MG/ML IV SOLN
INTRAVENOUS | Status: AC
  Filled 2014-01-14: qty 5

## 2014-01-14 MED ORDER — LACTATED RINGERS IV SOLN
INTRAVENOUS | Status: DC | PRN
  Administered 2014-01-14 (×2): via INTRAVENOUS

## 2014-01-14 MED ORDER — MORPHINE SULFATE 2 MG/ML IJ SOLN: 2.0000 mg | INTRAMUSCULAR | Status: DC | PRN

## 2014-01-14 MED ORDER — VALPROATE SODIUM 500 MG/5ML IV SOLN
500.0000 mg | Freq: Two times a day (BID) | INTRAVENOUS | Status: DC
  Administered 2014-01-14 – 2014-01-19 (×11): 500 mg via INTRAVENOUS
  Filled 2014-01-14 (×14): qty 5

## 2014-01-14 MED ORDER — DEXTROSE 5 % IV SOLN
1.5000 g | Freq: Two times a day (BID) | INTRAVENOUS | Status: AC
  Administered 2014-01-14 – 2014-01-15 (×2): 1.5 g via INTRAVENOUS
  Filled 2014-01-14 (×3): qty 1.5

## 2014-01-14 MED ORDER — DOPAMINE-DEXTROSE 3.2-5 MG/ML-% IV SOLN: 3.0000 ug/kg/min | INTRAVENOUS | Status: DC

## 2014-01-14 MED ORDER — PHENOL 1.4 % MT LIQD: 1.0000 | OROMUCOSAL | Status: DC | PRN

## 2014-01-14 MED ORDER — IPRATROPIUM-ALBUTEROL 0.5-2.5 (3) MG/3ML IN SOLN
3.0000 mL | Freq: Four times a day (QID) | RESPIRATORY_TRACT | Status: DC
  Administered 2014-01-14 – 2014-01-16 (×9): 3 mL via RESPIRATORY_TRACT
  Filled 2014-01-14 (×9): qty 3

## 2014-01-14 MED ORDER — HEPARIN SODIUM (PORCINE) 1000 UNIT/ML IJ SOLN
INTRAMUSCULAR | Status: AC
Start: 2014-01-14 — End: 2014-01-14
  Filled 2014-01-14: qty 1

## 2014-01-14 MED ORDER — HEPARIN SODIUM (PORCINE) 1000 UNIT/ML IJ SOLN
INTRAMUSCULAR | Status: AC
  Filled 2014-01-14: qty 1

## 2014-01-14 MED ORDER — SODIUM CHLORIDE 0.9 % IV SOLN
500.0000 mL | Freq: Once | INTRAVENOUS | Status: AC | PRN
  Administered 2014-01-14: 500 mL via INTRAVENOUS

## 2014-01-14 MED ORDER — HEPARIN SODIUM (PORCINE) 1000 UNIT/ML IJ SOLN
INTRAMUSCULAR | Status: DC | PRN
  Administered 2014-01-14: 6000 [IU] via INTRAVENOUS
  Administered 2014-01-14: 2000 [IU] via INTRAVENOUS

## 2014-01-14 MED ORDER — METOPROLOL TARTRATE 1 MG/ML IV SOLN
2.5000 mg | Freq: Four times a day (QID) | INTRAVENOUS | Status: DC
  Administered 2014-01-14 – 2014-01-21 (×23): 2.5 mg via INTRAVENOUS
  Filled 2014-01-14 (×34): qty 5

## 2014-01-14 MED ORDER — MOMETASONE FURO-FORMOTEROL FUM 100-5 MCG/ACT IN AERO
2.0000 | INHALATION_SPRAY | Freq: Two times a day (BID) | RESPIRATORY_TRACT | Status: DC
  Filled 2014-01-14: qty 8.8

## 2014-01-14 MED ORDER — GUAIFENESIN-DM 100-10 MG/5ML PO SYRP: 15.0000 mL | ORAL_SOLUTION | ORAL | Status: DC | PRN

## 2014-01-14 MED ORDER — NICOTINE 21 MG/24HR TD PT24
21.0000 mg | MEDICATED_PATCH | Freq: Every day | TRANSDERMAL | Status: DC
  Administered 2014-01-14 – 2014-01-22 (×9): 21 mg via TRANSDERMAL
  Filled 2014-01-14 (×9): qty 1

## 2014-01-14 MED ORDER — MIDAZOLAM HCL 2 MG/2ML IJ SOLN
INTRAMUSCULAR | Status: AC
  Filled 2014-01-14: qty 2

## 2014-01-14 MED ORDER — PHENYLEPHRINE HCL 10 MG/ML IJ SOLN
10.0000 mg | INTRAVENOUS | Status: DC | PRN
  Administered 2014-01-14: 25 ug/min via INTRAVENOUS
  Administered 2014-01-14: 10 ug/min via INTRAVENOUS

## 2014-01-14 MED ORDER — GLYCOPYRROLATE 0.2 MG/ML IJ SOLN
INTRAMUSCULAR | Status: DC | PRN
  Administered 2014-01-14: .8 mg via INTRAVENOUS

## 2014-01-14 MED ORDER — PANTOPRAZOLE SODIUM 40 MG PO TBEC: 40.0000 mg | DELAYED_RELEASE_TABLET | Freq: Every day | ORAL | Status: DC

## 2014-01-14 MED ORDER — DOCUSATE SODIUM 100 MG PO CAPS: 100.0000 mg | ORAL_CAPSULE | Freq: Every day | ORAL | Status: DC

## 2014-01-14 MED ORDER — SODIUM CHLORIDE 0.9 % IV SOLN
INTRAVENOUS | Status: DC | PRN
  Administered 2014-01-14 (×3): via INTRAVENOUS

## 2014-01-14 SURGICAL SUPPLY — 60 items
ADH SKN CLS APL DERMABOND .7 (GAUZE/BANDAGES/DRESSINGS) ×1
CANISTER SUCTION 2500CC (MISCELLANEOUS) ×3 IMPLANT
CLIP TI MEDIUM 24 (CLIP) ×3 IMPLANT
CLIP TI MEDIUM 6 (CLIP) ×3 IMPLANT
CLIP TI WIDE RED SMALL 24 (CLIP) ×3 IMPLANT
COUNTER NEEDLE 20 DBL MAG RED (NEEDLE) ×3 IMPLANT
COVER MAYO STAND STRL (DRAPES) IMPLANT
COVER SURGICAL LIGHT HANDLE (MISCELLANEOUS) ×3 IMPLANT
DERMABOND ADVANCED (GAUZE/BANDAGES/DRESSINGS) ×2
DERMABOND ADVANCED .7 DNX12 (GAUZE/BANDAGES/DRESSINGS) ×1 IMPLANT
DRAPE WARM FLUID 44X44 (DRAPE) ×3 IMPLANT
DRSG COVADERM 4X14 (GAUZE/BANDAGES/DRESSINGS) ×3 IMPLANT
ELECT BLADE 4.0 EZ CLEAN MEGAD (MISCELLANEOUS) ×3
ELECT BLADE 6.5 EXT (BLADE) IMPLANT
ELECT REM PT RETURN 9FT ADLT (ELECTROSURGICAL) ×3
ELECTRODE BLDE 4.0 EZ CLN MEGD (MISCELLANEOUS) ×1 IMPLANT
ELECTRODE REM PT RTRN 9FT ADLT (ELECTROSURGICAL) ×1 IMPLANT
GAUZE SPONGE 4X4 16PLY XRAY LF (GAUZE/BANDAGES/DRESSINGS) ×3 IMPLANT
GEL ULTRASOUND 20GR AQUASONIC (MISCELLANEOUS) IMPLANT
GLOVE BIO SURGEON STRL SZ 6.5 (GLOVE) ×12 IMPLANT
GLOVE BIO SURGEONS STRL SZ 6.5 (GLOVE) ×6
GLOVE BIOGEL PI IND STRL 6.5 (GLOVE) ×4 IMPLANT
GLOVE BIOGEL PI IND STRL 7.0 (GLOVE) ×2 IMPLANT
GLOVE BIOGEL PI IND STRL 7.5 (GLOVE) ×1 IMPLANT
GLOVE BIOGEL PI INDICATOR 6.5 (GLOVE) ×8
GLOVE BIOGEL PI INDICATOR 7.0 (GLOVE) ×4
GLOVE BIOGEL PI INDICATOR 7.5 (GLOVE) ×2
GLOVE SURG SS PI 7.5 STRL IVOR (GLOVE) ×3 IMPLANT
GOWN STRL REUS W/ TWL LRG LVL3 (GOWN DISPOSABLE) ×5 IMPLANT
GOWN STRL REUS W/ TWL XL LVL3 (GOWN DISPOSABLE) ×2 IMPLANT
GOWN STRL REUS W/TWL LRG LVL3 (GOWN DISPOSABLE) ×10
GOWN STRL REUS W/TWL XL LVL3 (GOWN DISPOSABLE) ×6
GRAFT HEMASHIELD 20X10 (Vascular Products) ×3 IMPLANT
HEMOSTAT SNOW SURGICEL 2X4 (HEMOSTASIS) ×6 IMPLANT
INSERT FOGARTY 61MM (MISCELLANEOUS) ×3 IMPLANT
INSERT FOGARTY SM (MISCELLANEOUS) ×6 IMPLANT
KIT BASIN OR (CUSTOM PROCEDURE TRAY) ×3 IMPLANT
KIT ROOM TURNOVER OR (KITS) ×3 IMPLANT
NS IRRIG 1000ML POUR BTL (IV SOLUTION) ×9 IMPLANT
PACK AORTA (CUSTOM PROCEDURE TRAY) ×3 IMPLANT
PAD ARMBOARD 7.5X6 YLW CONV (MISCELLANEOUS) ×6 IMPLANT
SPONGE INTESTINAL PEANUT (DISPOSABLE) ×3 IMPLANT
SUT ETHIBOND 5 LR DA (SUTURE) IMPLANT
SUT PDS AB 1 TP1 54 (SUTURE) ×12 IMPLANT
SUT PROLENE 3 0 SH 48 (SUTURE) ×15 IMPLANT
SUT PROLENE 5 0 C 1 24 (SUTURE) IMPLANT
SUT PROLENE 5 0 C 1 36 (SUTURE) ×48 IMPLANT
SUT PROLENE 6 0 BV (SUTURE) ×9 IMPLANT
SUT PROLENE 6 0 CC (SUTURE) ×3 IMPLANT
SUT SILK 2 0SH CR/8 30 (SUTURE) ×3 IMPLANT
SUT VIC AB 2-0 CT1 27 (SUTURE) ×15
SUT VIC AB 2-0 CT1 TAPERPNT 27 (SUTURE) ×5 IMPLANT
SUT VIC AB 3-0 SH 27 (SUTURE) ×6
SUT VIC AB 3-0 SH 27X BRD (SUTURE) ×2 IMPLANT
SUT VICRYL 4-0 PS2 18IN ABS (SUTURE) ×6 IMPLANT
TOWEL BLUE STERILE X RAY DET (MISCELLANEOUS) ×6 IMPLANT
TOWEL OR 17X24 6PK STRL BLUE (TOWEL DISPOSABLE) ×6 IMPLANT
TOWEL OR 17X26 10 PK STRL BLUE (TOWEL DISPOSABLE) ×6 IMPLANT
TRAY FOLEY CATH 16FRSI W/METER (SET/KITS/TRAYS/PACK) ×3 IMPLANT
WATER STERILE IRR 1000ML POUR (IV SOLUTION) ×3 IMPLANT

## 2014-01-14 NOTE — Anesthesia Postprocedure Evaluation (Signed)
Anesthesia Post Note  Patient: Ruben Blackwell  Procedure(s) Performed: Procedure(s) (LRB): ABDOMINAL AORTIC ANEURYSM WITH BI ILIAC REPAIR; PRIMARY REPAIR OF UMBILICAL HERNIA (N/A)  Anesthesia type: General  Patient location: ICU  Post pain: Pain level controlled  Post assessment: Post-op Vital signs reviewed  Last Vitals:  Filed Vitals:   01/14/14 1700  BP: 97/66  Pulse: 84  Temp: 37.1 C  Resp: 12    Post vital signs: stable  Level of consciousness: Patient remains intubated per anesthesia plan  Complications: No apparent anesthesia complications

## 2014-01-14 NOTE — Anesthesia Postprocedure Evaluation (Signed)
  Anesthesia Post-op Note  Patient: Ruben Blackwell  Procedure(s) Performed: Procedure(s): ABDOMINAL AORTIC ANEURYSM WITH BI ILIAC REPAIR; PRIMARY REPAIR OF UMBILICAL HERNIA (N/A)  Patient Location: SICU  Anesthesia Type:General  Level of Consciousness: sedated  Airway and Oxygen Therapy: Patient remains intubated per anesthesia plan and Patient placed on Ventilator (see vital sign flow sheet for setting)  Post-op Pain: patient sedated  Post-op Assessment: Post-op Vital signs reviewed, Patient's Cardiovascular Status Stable and Respiratory Function Stable  Post-op Vital Signs: Reviewed and stable  Last Vitals:  Filed Vitals:   01/14/14 1625  BP: 115/58  Pulse: 77  Temp:   Resp: 12    Complications: No apparent anesthesia complications

## 2014-01-14 NOTE — Progress Notes (Signed)
eLink Physician-Brief Progress Note Patient Name: [name] DOB: [birth year] MRN: [id number]  Date of Service  01/14/2014   HPI/Events of Note  Post op mechanical ventilation   eICU Interventions  Vent orders entered Nebulized BDs Meds changed to per tube where indicated Sedation with propofol continued Anticipate PCCM consult   Intervention Category Major Interventions: Respiratory failure - evaluation and management  Wilhelmina Mcardle 01/14/2014, 5:36 PM

## 2014-01-14 NOTE — Anesthesia Preprocedure Evaluation (Addendum)
Anesthesia Evaluation  Patient identified by MRN, date of birth, ID band Patient awake    Reviewed: Allergy & Precautions, H&P , NPO status , Patient's Chart, lab work & pertinent test results  Airway Mallampati: I TM Distance: >3 FB Neck ROM: Full    Dental  (+) Teeth Intact, Edentulous Upper, Dental Advisory Given   Pulmonary shortness of breath, COPD COPD inhaler, Current Smoker,          Cardiovascular hypertension, Pt. on medications + CAD and + DOE     Neuro/Psych Depression Bipolar Disorder Schizophrenia    GI/Hepatic GERD-  Medicated and Controlled,  Endo/Other    Renal/GU Renal insufficiency  Cr 2.1     Musculoskeletal   Abdominal   Peds  Hematology   Anesthesia Other Findings   Reproductive/Obstetrics                          Anesthesia Physical Anesthesia Plan  ASA: III  Anesthesia Plan: General   Post-op Pain Management:    Induction: Intravenous  Airway Management Planned: Oral ETT  Additional Equipment: Arterial line, CVP, PA Cath and Ultrasound Guidance Line Placement  Intra-op Plan:   Post-operative Plan: Possible Post-op intubation/ventilation  Informed Consent: I have reviewed the patients History and Physical, chart, labs and discussed the procedure including the risks, benefits and alternatives for the proposed anesthesia with the patient or authorized representative who has indicated his/her understanding and acceptance.     Plan Discussed with: CRNA and Surgeon  Anesthesia Plan Comments:         Anesthesia Quick Evaluation

## 2014-01-14 NOTE — Interval H&P Note (Signed)
History and Physical Interval Note:  01/14/2014 8:58 AM  Ruben Blackwell  has presented today for surgery, with the diagnosis of Abdominal aneurysm without mention of rupture  The various methods of treatment have been discussed with the patient and family. After consideration of risks, benefits and other options for treatment, the patient has consented to  Procedure(s): ANEURYSM ABDOMINAL AORTIC REPAIR (N/A) as a surgical intervention .  The patient's history has been reviewed, patient examined, no change in status, stable for surgery.  I have reviewed the patient's chart and labs.  Questions were answered to the patient's satisfaction.     Serafina Mitchell

## 2014-01-14 NOTE — Progress Notes (Signed)
Patient arrived from OR intubated, propofol at 60mcg, NGT, swan at 45cm. SBP 80-90 with propofol being titrated down to 74mcg. Following currently orders to administer 500cc NS x2 before starting dopamine gtt. Currently infusing bolus #1. BP 117/71. Contacted e-link for sedation and ventilation orders. Will continue to monitor. Richardean Sale, RN

## 2014-01-14 NOTE — Transfer of Care (Signed)
Immediate Anesthesia Transfer of Care Note  Patient: Ruben Blackwell  Procedure(s) Performed: Procedure(s): ABDOMINAL AORTIC ANEURYSM WITH BI ILIAC REPAIR; PRIMARY REPAIR OF UMBILICAL HERNIA (N/A)  Patient Location: SICU  Anesthesia Type:General  Level of Consciousness: sedated  Airway & Oxygen Therapy: Patient remains intubated per anesthesia plan and Patient placed on Ventilator (see vital sign flow sheet for setting)  Post-op Assessment: Report given to PACU RN and Post -op Vital signs reviewed and stable  Post vital signs: Reviewed and stable  Complications: No apparent anesthesia complications

## 2014-01-14 NOTE — Consult Note (Signed)
PULMONARY / CRITICAL CARE MEDICINE   Name: Ruben Blackwell MRN: [id number] DOB: May 11, 1956    ADMISSION DATE:  01/14/2014 CONSULTATION DATE:  01/14/2014  REFERRING MD :  Trula Slade PRIMARY SERVICE: Vascular Surgery  CHIEF COMPLAINT:  On vent s/p AAA repair  BRIEF PATIENT DESCRIPTION: 58 year old male with multiple medical problemsHTN, CKD III, Hyperthyroid, tardive dyskinesia, and complex psych history  presented 6/4 for elective AAA 6.4cm repair.Or couse complicated by bleeding and s/p 1 unit prbc per-operatively.  Remained on the ventilator post-operatively (reintubated, Per anesthesia: " Patient with upper airway obstruction and suboptimal tidal volumes. Reintubated"  PCCM asked to consult for vent/ICU management.   has a past medical history of Impaired glucose tolerance (08/27/2011); BIPOLAR DISORDER UNSPECIFIED (05/25/2009); COPD (08/22/2010); CORONARY ARTERY DISEASE (10/20/2009); HYPERLIPIDEMIA (05/20/2007); PARKINSON'S DISEASE (05/25/2009); SCHIZOAFFECTIVE DISORDER (05/20/2007); TARDIVE DYSKINESIA (05/20/2007); DEPRESSION (08/18/2008); GERD (12/01/2008); HYPERTHYROIDISM (08/18/2008); Unspecified chronic bronchitis (10/09/2012); Peripheral vascular disease; Barrett's esophagus (10/26/13); Personal history of colonic polyps-adenomas/ssp (10/26/13); Diverticulosis (10/26/13); Chronic kidney disease; Complication of anesthesia; PONV (postoperative nausea and vomiting); Shortness of breath; and Hypertension.   has past surgical history that includes Spine surgery; Hernia repair; Esophagogastroduodenoscopy; Colonoscopy w/ biopsies; and Cardiac catheterization (06-16-2009).    SIGNIFICANT EVENTS / STUDIES:  5/14 - CT Abd > Bilobed infrarenal aortic aneurysm, 6.4 cm maximum diameter, without iliac involvement. Colonic diverticulosis. 6/4 - Elective AAA repair  LINES / TUBES: OETT 6/4 >>>6/4, 6/4 >> Foley 6/4 >>> NGT 6/4 >>> RIJ PA cath 6/4 >>> Art line 6/4 >>>  CULTURES: n/a  ANTIBIOTICS: periop  cefuroxime  HISTORY OF PRESENT ILLNESS:  58 year old male with . AAA was incidental finding during abdominal US. He followed up with vascular surgery Dr Trula Slade who ordered a CT of the abdomen to better understand the aneurysm. CT 5/14 found it to be larger than originally thought, 6.4cm. Surgical repair was recommended, saw [place] in office for pre-op clearance. Had the repair 6/4, remained ventilated post operatively (reintubated) because of concern of lethargy and transferred to ICU. PCCM asked to consult for vent management.   PAST MEDICAL HISTORY :  Past Medical History  Diagnosis Date  . Impaired glucose tolerance 08/27/2011  . BIPOLAR DISORDER UNSPECIFIED 05/25/2009  . COPD 08/22/2010  . CORONARY ARTERY DISEASE 10/20/2009  . HYPERLIPIDEMIA 05/20/2007  . PARKINSON'S DISEASE 05/25/2009  . 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 TARDIVE DYSKINESIA 05/20/2007  . DEPRESSION 08/18/2008  . GERD 12/01/2008  . HYPERTHYROIDISM 08/18/2008  . Unspecified chronic bronchitis 10/09/2012  . Peripheral vascular disease   . Barrett's esophagus 10/26/13    Dx EGD 10/2013  . Personal history of colonic polyps-adenomas/ssp 10/26/13  . Diverticulosis 10/26/13    mild  . Chronic kidney disease     Stage III   . Complication of anesthesia   . PONV (postoperative nausea and vomiting)     Pt reports nausea only after cardiac cath  . Shortness of breath   . Hypertension     Pt reports that Dr. Cathlean Cower placed him on BP meds temporarily but no longer takes.   Past Surgical History  Procedure Laterality Date  . Spine surgery    . Hernia repair    . Esophagogastroduodenoscopy    . Colonoscopy w/ biopsies    . Cardiac catheterization  06-16-2009    Dr. Johnsie Cancel   Prior to Admission medications   Medication Sig Start Date End Date Taking? Authorizing Provider  divalproex (DEPAKOTE) 500 MG DR tablet Take 1,000 mg by mouth at bedtime.  Yes Historical Provider, MD  FLUoxetine (PROZAC) 20 MG capsule Take 20 mg by mouth  at bedtime.   Yes Historical Provider, MD  haloperidol (HALDOL) 2 MG tablet Take 2 mg by mouth at bedtime.   Yes Historical Provider, MD  mometasone-formoterol (DULERA) 100-5 MCG/ACT AERO Inhale 2 puffs into the lungs 2 (two) times daily.   Yes Historical Provider, MD  nicotine (NICODERM CQ - DOSED IN MG/24 HOURS) 21 mg/24hr patch Place 21 mg onto the skin daily.   Yes Historical Provider, MD  omeprazole (PRILOSEC) 40 MG capsule Take 1 capsule (40 mg total) by mouth daily before supper. 12/15/13  Yes Gatha Mayer, MD  simvastatin (ZOCOR) 40 MG tablet Take 40 mg by mouth at bedtime.   Yes Historical Provider, MD  tiotropium (SPIRIVA) 18 MCG inhalation capsule Place 18 mcg into inhaler and inhale daily.   Yes Historical Provider, MD   Allergies  Allergen Reactions  . Sulfonamide Derivatives     REACTION: Reaction not known    FAMILY HISTORY:  Family History  Problem Relation Age of Onset  . Hyperlipidemia Mother   . Hypertension Mother   . Varicose Veins Mother   . Diabetes Father   . Heart disease Father     before age 81  . Heart attack Father   . Cancer Sister   . Hyperlipidemia Sister   . Hypertension Sister   . Cancer Brother   . Diabetes Brother   . Hyperlipidemia Brother   . Hypertension Brother    SOCIAL HISTORY:  reports that he has been smoking Cigarettes.  He has a 100 pack-year smoking history. He has never used smokeless tobacco. He reports that he drinks alcohol. He reports that he does not use illicit drugs.  REVIEW OF SYSTEMS:  Unable as pt is intubated.   SUBJECTIVE:   VITAL SIGNS: Temp:  [98.5 F (36.9 C)-98.8 F (37.1 C)] 98.8 F (37.1 C) (06/04 1700) Pulse Rate:  [74-86] 84 (06/04 1700) Resp:  [12-18] 12 (06/04 1700) BP: (97-172)/(58-100) 97/66 mmHg (06/04 1700) SpO2:  [98 %-99 %] 98 % (06/04 1700) Arterial Line BP: (99-116)/(62-74) 99/63 mmHg (06/04 1700) FiO2 (%):  [50 %] 50 % (06/04 1742) Weight:  [71.1 kg (156 lb 12 oz)] 71.1 kg (156 lb 12 oz)  (06/04 1700) HEMODYNAMICS: PAP: (24-35)/(8-21) 28/16 mmHg CO:  [6.1 L/min] 6.1 L/min CI:  [3.3 L/min/m2] 3.3 L/min/m2 VENTILATOR SETTINGS: Vent Mode:  [-] PRVC FiO2 (%):  [50 %] 50 % Set Rate:  [12 bmp] 12 bmp Vt Set:  [560 mL] 560 mL PEEP:  [5 cmH20] 5 cmH20 Pressure Support:  [10 cmH20] 10 cmH20 Plateau Pressure:  [13 cmH20] 13 cmH20 INTAKE / OUTPUT: Intake/Output     06/03 0701 - 06/04 0700 06/04 0701 - 06/05 0700   I.V. (mL/kg)  4040 (56.8)   Blood  835   IV Piggyback  750   Total Intake(mL/kg)  5625 (79.1)   Urine (mL/kg/hr)  1390   Blood  1400   Total Output   2790   Net   +2835          PHYSICAL EXAMINATION: General:  Intubated male in NAD Neuro:  Awake, alert, follows commands but when awake he has significant tardive dyskinesia HEENT:  [place]/AT, no JVD noted Cardiovascular: RRR, positive pedal pulses b/l Lungs:  Clear anterior. Abdomen:  +BS, pain at surgical site.  Musculoskeletal:  No acute deformity. Jerking movements of all 4 extremities reportedly due to tardive dyskinesia.  Skin:  Intact anteriorly  LABS:  PULMONARY  Recent Labs Lab 01/14/14 1443  PHART 7.337*  PCO2ART 42.4  PO2ART 225.0*  HCO3 22.7  TCO2 24  O2SAT 100.0    CBC  Recent Labs Lab 01/14/14 1443 01/14/14 1840  HGB 9.5* 11.6*  HCT 28.0* 34.3*  WBC  --  13.4*  PLT  --  170    COAGULATION  Recent Labs Lab 01/14/14 1840  INR 1.08    CARDIAC  No results found for this basename: TROPONINI,  in the last 168 hours No results found for this basename: PROBNP,  in the last 168 hours   CHEMISTRY  Recent Labs Lab 01/14/14 1443 01/14/14 1840  NA 142 139  K 4.0 5.5*  CL  --  108  CO2  --  22  GLUCOSE  --  154*  BUN  --  23  CREATININE  --  2.11*  CALCIUM  --  7.4*  MG  --  1.6   Estimated Creatinine Clearance: 38.9 ml/min (by C-G formula based on Cr of 2.11).   LIVER  Recent Labs Lab 01/14/14 1840  INR 1.08     INFECTIOUS No results found for this  basename: LATICACIDVEN, PROCALCITON,  in the last 168 hours   ENDOCRINE CBG (last 3)  No results found for this basename: GLUCAP,  in the last 72 hours       IMAGING x48h  Dg Chest Portable 1 View  01/14/2014   CLINICAL DATA:  Post triple AAA repair, re-intubation  EXAM: PORTABLE CHEST - 1 VIEW  COMPARISON:  01/07/2014; chest CT - 12/21/2013  FINDINGS: Apparent enlargement of the cardiac silhouette and mediastinal contours is favored to be secondary to decreased lung volumes and patient rotation. Endotracheal tube overlies the tracheal air column with tip approximately 5.4 cm above the carina. Enteric tube tip projects below the left hemidiaphragm. Right jugular approach PA catheter with tip coiled over the expected location of the main pulmonary artery outflow tract. There is partial atelectasis of the right lower lobe. No pleural effusion. No definite evidence of edema. Unchanged bones.  IMPRESSION: 1. Right jugular approach PA catheter tip appears coiled over the main pulmonary artery. Repositioning is advised. 2. Otherwise, appropriately positioned support apparatus as above. No supine evidence of pneumothorax. 3. Suspected at least partial atelectasis of the right middle lobe. Otherwise, no acute cardiopulmonary disease. Specifically, no evidence of edema.   Electronically Signed   By: Sandi Mariscal M.D.   On: 01/14/2014 17:22   Dg Abd Portable 1v  01/14/2014   CLINICAL DATA:  Incorrect instrument count.  EXAM: PORTABLE ABDOMEN - 1 VIEW  COMPARISON:  May 15, 2005.  FINDINGS: Nasogastric tube tip seen in proximal stomach. Surgical clips seen in left paraspinal region. No abnormal bowel gas pattern is noted. No abnormal calcifications are noted. No evidence of retained surgical instrument is identified in the included field of view.  IMPRESSION: No evidence of retained instrument or other radiopaque foreign body.   Electronically Signed   By: Sabino Dick M.D.   On: 01/14/2014 15:49       ASSESSMENT / PLAN:  PULMONARY A: Acute Post OP Respiratory Failure with reintubation due to lethargy. Ass Possible RML collapse +  in setting of post operative period.   P:   Full vent support overnight Likely wean to extubate in AM Pulmonary hygiene; chest PT CXR in am  CARDIOVASCULAR A:  S/p AAA repair  P:  Management per Vascular Surgery SBP goal less  than 140, PRN labetalol, hydralazine Check CBC, May need Blood if hgb < 7gm% or hemodynamic instability Hydrate  RENAL A:   CKD III baseline creat 2   - currently unchanged creat but K 5.5 and mag 1.6  P:   DC d5 half normal with K in it, and give Kayexalate for high K Mag sulfate for low mag Check BMP Hydrate  GASTROINTESTINAL A:   GERD  P:   PPI  HEMATOLOGIC A:   Likely acute blood loss anemia; sp 1 unit prbc in OR VTE ppx per surgery   - post op cbc looks ok  P:  CBC in process May require transfusion  INFECTIOUS A:   No acute evidence of infection  P:   Monitor WBC and fever curve.  ENDOCRINE A:   No acute issues  P:   Check glucose on Bmet  NEUROLOGIC A:   Acute encephalopathy in setting of post operative period.  Bipolar disorder Schizoaffective disorder Depression Parkinson's Disease Tardive Dyskinesia    - when awake he has significant tardive dyskinesia  P:   Continue home valproic acid, haldol (he is on haldol despite tardive dyskinesia) Hold fluoxetine until taking PO Monitor No morphine for pain in setting of renal failure Dilaudid prn for pain     Georgann Housekeeper, ACNP [place] [facility] Pager [phone] or [phone]    STAFF NOTE: I, Dr Ann Lions have personally reviewed patient's available data, including medical history, events of note, physical examination and test results as part of my evaluation. I have discussed with resident/NP and other care providers such as pharmacist, RN and RRT.  In addition,  I personally evaluated  patient and elicited key findings of acute post op resp failure due to general anesthesia retention +/- RML collapse. Monitor In ICU overnight. Will aim extubation 01/15/14 after adequate chest pt. .  Rest per NP/medical resident whose note is outlined above and that I agree with  The patient is critically ill with multiple organ systems failure and requires high complexity decision making for assessment and support, frequent evaluation and titration of therapies, application of advanced monitoring technologies and extensive interpretation of multiple databases.   Critical Care Time devoted to patient care services described in this note is  45  Minutes.  Dr. Brand Males, M.D., [facility].C.P Pulmonary and Critical Care Medicine Staff Physician [place] [facility] Pager: [phone], If no answer or between  15:00h - 7:00h: call [phone]  01/14/2014 7:59 PM

## 2014-01-14 NOTE — Op Note (Signed)
Patient name: Ruben Blackwell MRN: [id number] DOB: [birth year] Sex: male  01/14/2014 Pre-operative Diagnosis: Abdominal aortic aneurysm Post-operative diagnosis:  Same Surgeon:  Serafina Mitchell Assistants:  Gae Gallop, Silva Bandy Procedure:   #1: Open repair of infrarenal abdominal aortic aneurysm using a bifurcated 20 x 10 dacryon graft   #2: Primary repair of umbilical hernia Anesthesia:  Gen. Blood Loss:  See anesthesia record Specimens:  None  Findings:  Aorta was very adherent to the vena cava distally.  The proximal anastomosis was end to end, and the distal anastomosis were to the proximal common iliac artery.  The patient had a small umbilical hernia with omentum.  This was repaired primarily with Prolene suture.  Indications:  The patient was found to have a large 6.5 cm abdominal aortic aneurysm.  After reviewing his CT scan, I did not feel that he was a good candidate for endovascular repair.  He was seen and evaluated by pulmonary.  Because of her smoking history he has not a great risk for open surgery, but pulmonary felt that his risk was not prohibitive.  Because of the size of his aneurysm I wished to proceed.  The patient also wanted to get this done.  Procedure:  The patient was identified in the holding area and taken to [place] [address]  The patient was then placed supine on the table. general anesthesia was administered.  The patient was prepped and draped in the usual sterile fashion.  A time out was called and antibiotics were administered.  A midline incision was made from the xiphoid down to below the umbilicus.  Cautery was used to divide the subcutaneous tissue down to the fascia.  The fascia was then opened in the midline.  The peritoneal contents were entered sharply.  I then opened up the fascia throughout the length of the incision.  The patient did have omentum within a small umbilical hernia.  The omentum was gently teased out. A Balfour was then placed.  I then  inspected the abdomen.  There were no gross abnormalities.  The transverse colon was reflected cephalad.  The ligament of Treitz was taken down with a combination of cautery and sharp dissection.  Next, the Omni tract was positioned.  The small bowel was then mobilized to the patient's right.  I exposed the anterior surface of the aneurysm.  I proceeded with exposure of the aortic neck.  Upon positioning of the clamp there was a tear and a large vein which I initially thought was the renal vein but most likely was the splenic vein.  This was repaired primarily with 5-0 Prolene without significant narrowing of the vein.  It similarly I had to repair a small defect within the left renal vein primarily with 5-0 Prolene without significant narrowing.  I was ultimately able to get exposure of the infrarenal neck.  The aorta was rather large at this area but ectatic and aneurysmal.  An umbilical tape was passed.  I then dissected out the rest of the aorta.  The IMA was encircled with a vessel loop.  I circumferentially exposed the left and right common iliac artery.  The were encircled with vessel loops.  I then tied to get circumferential control at the distal abdominal aorta.  The vena cava was densely adherent to the aorta at this level.  I tried to gently tease it off so I would have better exposure for a tube graft.  I did have a defect within  what turned out to be a large lumbar vein.  After getting appropriate exposure this was able to be controlled with a 5-0 Prolene.  Next, the patient was given systemic heparinization.  After the heparin circulated a Satinsky clamp was used to occlude the infrarenal aorta.  Angled coarctation clamps were used to occlude the common iliac arteries.  A #11 blade was used to open the aorta.  I used curved Mayo scissors to extend this proximally and distally.  All backbleeding lumbar arteries were ligated with 2-0 silk suture ligatures.  Aortic thrombus was removed from the aneurysm  sac.  I then transected the aorta at its neck.  I selected a 20 x 10 dacryon bifurcated graft the proximal anastomosis was performed in a end to end fashion.  I used a felt strip and placed 3 horizontal mattress sutures on the posterior wall I then ran the lateral sutures around anteriorly incorporating the felt strip.  I did have to place one repair stitch on the posterior wall for hemostasis.  There was good hemostasis when the aortic clamp was removed.  I then transected the right common iliac artery.  A hand to end anastomosis was created between the graft and the artery with running 5-0 Prolene.  Blood flow was then reestablished to the right leg.  Similarly a cut the left limb of the graft the appropriate length.  A end to end anastomosis was created to the proximal left common iliac artery with running 5-0 Prolene.  Prior to completion, the appropriate flushing maneuvers were performed and blood flow was reestablished to the left leg.  There was excellent Doppler signals in the native iliac arteries as well as palpable femoral pulses.  The patient was given 50 mg of protamine.  There was pulsatile back bleeding from the inferior mesenteric artery and therefore it was ligated.  There was diffuse oozing from around the aortic neck.  I spent about 30 minutes trying to intensify what was causing the use however no surgical bleeding was identified.  I did see a large lumbar vein was was able to be ligated with 5-0 Prolene.  This appeared to decrease the bleeding.  I felt it was safe to close.  I closed the aneurysm sac over top of the graft with running 2-0 Vicryl.  I then reapproximated the retroperitoneum with running 2-0 Vicryl.  The bowel was placed back into its anatomic position.  It was grossly inspected and was without defect, as was the colon which looked well perfused.  I then used #1 PDS to close the umbilical hernia.  This was done transversely.  The abdominal wall fascia was then closed using 2 #1 PDS  suture.  Subcutaneous tissues closed with 2-0 Vicryl the skin was closed with 4-0 Vicryl.  Dermabond was applied.  The patient was initially extubated but had to be reintubated.  He is taken to the ICU in stable but guarded condition   Disposition:  To PACU in stable condition.   Theotis Burrow, M.D. [facility] Office: [phone] Pager:  [phone]

## 2014-01-14 NOTE — H&P (View-Only) (Signed)
Patient name: Ruben Blackwell MRN: [id number] DOB: 10-20-1955 Sex: male     Chief Complaint  Patient presents with  . Re-evaluation    6 month f/u     HISTORY OF PRESENT ILLNESS: Patient is back today for followup of his abdominal aortic aneurysm.  This was detected as an incidental finding on an abdominal ultrasound.  It measured 4.7 cm.  The patient suffers from Parkinson disease and schizoaffective disorder.  He has tardive dyskinesia.  He is also a heavy smoker and has COPD and is on oxygen.  Past Medical History  Diagnosis Date  . Impaired glucose tolerance 08/27/2011  . BIPOLAR DISORDER UNSPECIFIED 05/25/2009  . COPD 08/22/2010  . CORONARY ARTERY DISEASE 10/20/2009  . HYPERLIPIDEMIA 05/20/2007  . PARKINSON'S DISEASE 05/25/2009  . 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 TARDIVE DYSKINESIA 05/20/2007  . DEPRESSION 08/18/2008  . GERD 12/01/2008  . HYPERTHYROIDISM 08/18/2008  . Unspecified chronic bronchitis 10/09/2012  . Chronic kidney disease   . Peripheral vascular disease   . Barrett's esophagus 10/26/13    Dx EGD 10/2013  . Personal history of colonic polyps-adenomas/ssp 10/26/13  . Diverticulosis 10/26/13    mild    Past Surgical History  Procedure Laterality Date  . Cardiac catheterization    . Spine surgery    . Hernia repair    . Esophagogastroduodenoscopy    . Colonoscopy w/ biopsies      History   Social History  . Marital Status: Divorced    Spouse Name: N/A    Number of Children: N/A  . Years of Education: N/A   Occupational History  . Not on file.   Social History Main Topics  . Smoking status: Heavy Tobacco Smoker -- 2.50 packs/day for 40 years    Types: Cigarettes  . Smokeless tobacco: Never Used  . Alcohol Use: Yes     Comment: rarely  . Drug Use: No  . Sexual Activity: Not on file   Other Topics Concern  . Not on file   Social History Narrative  . No narrative on file    Family History  Problem Relation Age of Onset  . Hyperlipidemia  Mother   . Hypertension Mother   . Varicose Veins Mother   . Diabetes Father   . Heart disease Father     before age 53  . Heart attack Father   . Cancer Sister   . Hyperlipidemia Sister   . Hypertension Sister   . Cancer Brother   . Diabetes Brother   . Hyperlipidemia Brother   . Hypertension Brother     Allergies as of 12/21/2013 - Review Complete 12/21/2013  Allergen Reaction Noted  . Sulfonamide derivatives      Current Outpatient Prescriptions on File Prior to Visit  Medication Sig Dispense Refill  . aluminum hydroxide-magnesium carbonate (GAVISCON) 95-358 MG/15ML SUSP Take 30 mLs by mouth at bedtime. 2 tablespoons    0  . divalproex (DEPAKOTE) 500 MG DR tablet Take 1 tablet (500 mg total) by mouth 2 (two) times daily. 2 by mouth every evening  180 tablet  3  . FLUoxetine (PROZAC) 20 MG capsule Take 1 capsule (20 mg total) by mouth daily.  90 capsule  3  . haloperidol (HALDOL) 1 MG tablet Take 2 mg by mouth daily.      Marland Kitchen omeprazole (PRILOSEC) 40 MG capsule Take 1 capsule (40 mg total) by mouth daily before supper.  30 capsule  11  . ondansetron (ZOFRAN) 4 MG  tablet Take 1 tablet (4 mg total) by mouth every 8 (eight) hours as needed for nausea.  40 tablet  0  . simvastatin (ZOCOR) 40 MG tablet TAKE ONE TABLET BY MOUTH ONCE DAILY  90 tablet  3   No current facility-administered medications on file prior to visit.     REVIEW OF SYSTEMS: No changes from prior visit  PHYSICAL EXAMINATION:   Vital signs are BP 138/90  Pulse 85  Ht 5' 8.5" (1.74 m)  Wt 155 lb (70.308 kg)  BMI 23.22 kg/m2  SpO2 97% General: The patient appears their stated age. HEENT:  No gross abnormalities Pulmonary:  Non labored breathing Abdomen: Soft and non-tender Musculoskeletal: There are no major deformities. Neurologic: No focal weakness or paresthesias are detected, Skin: There are no ulcer or rashes noted. Psychiatric: The patient has normal affect. Cardiovascular: There is a regular rate  and rhythm without significant murmur appreciated.   Diagnostic Studies Carotid duplex: Less than 40% stenosis bilaterally. Lower extremity ultrasound: No evidence of popliteal aneurysm ABI: 1.3 on the right 1.4 on the left, both triphasic  CT scan: Bilobed infrarenal aneurysm with maximum diameter of 6.3 cm.  He has a 3 x 4 mm right lower lobe lung nodule.  One year followup CT scan is recommended  Assessment: Abdominal aortic aneurysm Plan: I elected to get the CT scan today without contrast, as the patient's creatinine was 1.8.  The patient was found to have a 6.3 cm aneurysm.  I suspect that the ultrasound did not detect this 6 months ago.  I doubt that it has grown this months in the past 6 months.  Regardless, I am not sure if the patient is a candidate for endovascular infrarenal repair.  He is going to have to have another CT scan, this time with contrast, so I can better define his anatomy.  He is currently asymptomatic.  The patient has a significant smoking history and is on home oxygen.  He will need formal pulmonary clearance before proceeding with surgery.  In addition a 3 x 4 mm right lower lobe lung nodule was detected.  L. as pulmonary to follow this.  He'll need a CT scan in one year  The patient will followup with me one week from today.  I have encouraged him to stop drinking as much patency and to hydrate himself with water between now and the time of his CT scan  V. Leia Alf, M.D. [facility] Office: [phone] Pager:  [phone]

## 2014-01-14 NOTE — Anesthesia Procedure Notes (Addendum)
Procedure Name: Intubation Date/Time: 01/14/2014 9:31 AM Performed by: Suzy Bouchard Pre-anesthesia Checklist: Patient identified, Emergency Drugs available, Suction available, Patient being monitored and Timeout performed Patient Re-evaluated:Patient Re-evaluated prior to inductionOxygen Delivery Method: Circle system utilized Preoxygenation: Pre-oxygenation with 100% oxygen Intubation Type: IV induction Ventilation: Mask ventilation without difficulty and Oral airway inserted - appropriate to patient size Laryngoscope Size: Sabra Heck and 2 Grade View: Grade I Tube type: Subglottic suction tube Tube size: 7.5 mm Number of attempts: 1 Airway Equipment and Method: Stylet Placement Confirmation: ETT inserted through vocal cords under direct vision,  positive ETCO2 and breath sounds checked- equal and bilateral Secured at: 24 cm Tube secured with: Tape Dental Injury: Teeth and Oropharynx as per pre-operative assessment

## 2014-01-15 ENCOUNTER — Encounter (HOSPITAL_COMMUNITY): Payer: Self-pay | Admitting: Surgery

## 2014-01-15 ENCOUNTER — Inpatient Hospital Stay (HOSPITAL_COMMUNITY): Payer: Medicare Other

## 2014-01-15 DIAGNOSIS — E872 Acidosis, unspecified: Secondary | ICD-10-CM

## 2014-01-15 DIAGNOSIS — N189 Chronic kidney disease, unspecified: Secondary | ICD-10-CM

## 2014-01-15 DIAGNOSIS — I714 Abdominal aortic aneurysm, without rupture, unspecified: Principal | ICD-10-CM

## 2014-01-15 DIAGNOSIS — N179 Acute kidney failure, unspecified: Secondary | ICD-10-CM

## 2014-01-15 LAB — POCT I-STAT 3, ART BLOOD GAS (G3+)
ACID-BASE DEFICIT: 7 mmol/L — AB (ref 0.0–2.0)
Acid-base deficit: 4 mmol/L — ABNORMAL HIGH (ref 0.0–2.0)
Bicarbonate: 19.2 mEq/L — ABNORMAL LOW (ref 20.0–24.0)
Bicarbonate: 21.3 mEq/L (ref 20.0–24.0)
O2 SAT: 98 %
O2 Saturation: 97 %
PH ART: 7.321 — AB (ref 7.350–7.450)
PO2 ART: 97 mmHg (ref 80.0–100.0)
Patient temperature: 37.6
TCO2: 20 mmol/L (ref 0–100)
TCO2: 22 mmol/L (ref 0–100)
pCO2 arterial: 39.9 mmHg (ref 35.0–45.0)
pCO2 arterial: 41.5 mmHg (ref 35.0–45.0)
pH, Arterial: 7.294 — ABNORMAL LOW (ref 7.350–7.450)
pO2, Arterial: 115 mmHg — ABNORMAL HIGH (ref 80.0–100.0)

## 2014-01-15 LAB — PULMONARY FUNCTION TEST
DL/VA % PRED: 58 %
DL/VA: 2.66 ml/min/mmHg/L
DLCO UNC % PRED: 45 %
DLCO unc: 13.67 ml/min/mmHg
FEF 25-75 Post: 0.42 L/sec
FEF 25-75 Pre: 0.32 L/sec
FEF2575-%Change-Post: 28 %
FEF2575-%Pred-Post: 13 %
FEF2575-%Pred-Pre: 10 %
FEV1-%Change-Post: -9 %
FEV1-%PRED-POST: 17 %
FEV1-%PRED-PRE: 18 %
FEV1-Post: 0.61 L
FEV1-Pre: 0.67 L
FEV1FVC-%CHANGE-POST: -24 %
FEV1FVC-%Pred-Pre: 49 %
FEV6-%CHANGE-POST: 15 %
FEV6-%Pred-Post: 45 %
FEV6-%Pred-Pre: 39 %
FEV6-PRE: 1.74 L
FEV6-Post: 2.01 L
FEV6FVC-%Change-Post: -4 %
FEV6FVC-%PRED-POST: 99 %
FEV6FVC-%PRED-PRE: 103 %
FVC-%Change-Post: 20 %
FVC-%PRED-POST: 45 %
FVC-%Pred-Pre: 37 %
FVC-PRE: 1.75 L
FVC-Post: 2.11 L
POST FEV1/FVC RATIO: 29 %
PRE FEV6/FVC RATIO: 99 %
Post FEV6/FVC ratio: 95 %
Pre FEV1/FVC ratio: 38 %

## 2014-01-15 LAB — COMPREHENSIVE METABOLIC PANEL
ALBUMIN: 2.5 g/dL — AB (ref 3.5–5.2)
ALK PHOS: 39 U/L (ref 39–117)
ALT: 10 U/L (ref 0–53)
AST: 20 U/L (ref 0–37)
BILIRUBIN TOTAL: 0.2 mg/dL — AB (ref 0.3–1.2)
BUN: 27 mg/dL — AB (ref 6–23)
CHLORIDE: 107 meq/L (ref 96–112)
CO2: 20 meq/L (ref 19–32)
Calcium: 8.1 mg/dL — ABNORMAL LOW (ref 8.4–10.5)
Creatinine, Ser: 2.6 mg/dL — ABNORMAL HIGH (ref 0.50–1.35)
GFR calc Af Amer: 30 mL/min — ABNORMAL LOW (ref >90)
GFR, EST NON AFRICAN AMERICAN: 26 mL/min — AB (ref >90)
Glucose, Bld: 143 mg/dL — ABNORMAL HIGH (ref 70–99)
POTASSIUM: 5.3 meq/L (ref 3.7–5.3)
Sodium: 141 mEq/L (ref 137–147)
Total Protein: 5.1 g/dL — ABNORMAL LOW (ref 6.0–8.3)

## 2014-01-15 LAB — CBC WITH DIFFERENTIAL/PLATELET
Basophils Absolute: 0 10*3/uL (ref 0.0–0.1)
Basophils Relative: 0 % (ref 0–1)
EOS ABS: 0 10*3/uL (ref 0.0–0.7)
EOS PCT: 0 % (ref 0–5)
HCT: 33 % — ABNORMAL LOW (ref 39.0–52.0)
HEMOGLOBIN: 11.3 g/dL — AB (ref 13.0–17.0)
Lymphocytes Relative: 4 % — ABNORMAL LOW (ref 12–46)
Lymphs Abs: 0.6 10*3/uL — ABNORMAL LOW (ref 0.7–4.0)
MCH: 33.4 pg (ref 26.0–34.0)
MCHC: 34.2 g/dL (ref 30.0–36.0)
MCV: 97.6 fL (ref 78.0–100.0)
MONOS PCT: 15 % — AB (ref 3–12)
Monocytes Absolute: 2.1 10*3/uL — ABNORMAL HIGH (ref 0.1–1.0)
Neutro Abs: 11.1 10*3/uL — ABNORMAL HIGH (ref 1.7–7.7)
Neutrophils Relative %: 81 % — ABNORMAL HIGH (ref 43–77)
Platelets: 156 10*3/uL (ref 150–400)
RBC: 3.38 MIL/uL — ABNORMAL LOW (ref 4.22–5.81)
RDW: 15.3 % (ref 11.5–15.5)
WBC: 13.8 10*3/uL — ABNORMAL HIGH (ref 4.0–10.5)

## 2014-01-15 LAB — PHOSPHORUS: Phosphorus: 4.3 mg/dL (ref 2.3–4.6)

## 2014-01-15 LAB — MAGNESIUM: MAGNESIUM: 2.1 mg/dL (ref 1.5–2.5)

## 2014-01-15 LAB — TROPONIN I
Troponin I: <0.3 ng/mL (ref <0.30)
Troponin I: <0.3 ng/mL (ref <0.30)

## 2014-01-15 LAB — LACTIC ACID, PLASMA
Lactic Acid, Venous: 2.3 mmol/L — ABNORMAL HIGH (ref 0.5–2.2)
Lactic Acid, Venous: 2.3 mmol/L — ABNORMAL HIGH (ref 0.5–2.2)

## 2014-01-15 LAB — PRO B NATRIURETIC PEPTIDE: Pro B Natriuretic peptide (BNP): 136.7 pg/mL — ABNORMAL HIGH (ref 0–125)

## 2014-01-15 LAB — CK TOTAL AND CKMB (NOT AT ARMC)
CK, MB: 1.2 ng/mL (ref 0.3–4.0)
Relative Index: 0.9 (ref 0.0–2.5)
Total CK: 140 U/L (ref 7–232)

## 2014-01-15 LAB — AMYLASE: Amylase: 112 U/L — ABNORMAL HIGH (ref 0–105)

## 2014-01-15 LAB — PROCALCITONIN: PROCALCITONIN: 1.39 ng/mL

## 2014-01-15 MED ORDER — BIOTENE DRY MOUTH MT LIQD
15.0000 mL | Freq: Four times a day (QID) | OROMUCOSAL | Status: DC
  Administered 2014-01-15 – 2014-01-22 (×17): 15 mL via OROMUCOSAL

## 2014-01-15 MED ORDER — CHLORHEXIDINE GLUCONATE 0.12 % MT SOLN
15.0000 mL | Freq: Two times a day (BID) | OROMUCOSAL | Status: DC
  Administered 2014-01-15 – 2014-01-21 (×13): 15 mL via OROMUCOSAL
  Filled 2014-01-15 (×18): qty 15

## 2014-01-15 MED ORDER — SODIUM CHLORIDE 0.9 % IV BOLUS (SEPSIS)
1000.0000 mL | Freq: Once | INTRAVENOUS | Status: AC
  Administered 2014-01-15: 1000 mL via INTRAVENOUS

## 2014-01-15 MED FILL — Sodium Chloride IV Soln 0.9%: INTRAVENOUS | Qty: 1000 | Status: AC

## 2014-01-15 MED FILL — Heparin Sodium (Porcine) Inj 1000 Unit/ML: INTRAMUSCULAR | Qty: 30 | Status: AC

## 2014-01-15 NOTE — Progress Notes (Signed)
INITIAL NUTRITION ASSESSMENT  DOCUMENTATION CODES Per approved criteria  -Not Applicable   INTERVENTION: Recommend initiation of nutrition support (EN vs TPN) in next 24-48 hrs of ICU admission if prolonged intubation expected RD to follow for nutrition care plan  NUTRITION DIAGNOSIS: Inadequate oral intake related to inability to eat as evidenced by NPO status  Goal: Pt to meet >/= 90% of their estimated nutrition needs   Monitor:  Nutrition support initiaiton, respiratory status, weight, labs, I/O's  Reason for Assessment: VDRF  58 y.o. male  Admitting Dx: abdominal aortic aneurysm  ASSESSMENT: Patient with PMH of Parkinson's disease, schizoaffective disorder, smoking on home oxygen; presented for elective AAA repair; OR course complicated by bleeding and s/p 1 unit prbc.  Patient s/p procedure 6/4: OPEN REPAIR OF INFRARENAL ABDOMINAL AORTIC ANEURYSM  PRIMARY REPAIR OR UMBILICAL HERNIA  Patient is currently intubated on ventilator support -- NGT to LIS MV: 17.5 L/min Temp (24hrs), Avg:99.1 F (37.3 C), Min:98.2 F (36.8 C), Max:99.7 F (37.6 C)   Height: Ht Readings from Last 1 Encounters:  01/14/14 5' 8.5" (1.74 m)    Weight: Wt Readings from Last 1 Encounters:  01/15/14 169 lb 8.5 oz (76.9 kg)    Ideal Body Weight: 154 lb  % Ideal Body Weight: 109%  Wt Readings from Last 10 Encounters:  01/15/14 169 lb 8.5 oz (76.9 kg)  01/15/14 169 lb 8.5 oz (76.9 kg)  01/07/14 156 lb 12.8 oz (71.124 kg)  12/29/13 154 lb (69.854 kg)  12/28/13 152 lb (68.947 kg)  12/23/13 156 lb 9.6 oz (71.033 kg)  12/21/13 155 lb (70.308 kg)  12/15/13 157 lb (71.215 kg)  10/28/13 160 lb (72.576 kg)  10/26/13 162 lb (73.483 kg)    Usual Body Weight: 156 lb  % Usual Body Weight: 108%  BMI:  Body mass index is 25.4 kg/(m^2).  Estimated Nutritional Needs: Kcal: 2100-2250 Protein: 120-130 gm Fluid: per MD  Skin: abdominal surgical incision   Diet Order: NPO  EDUCATION  NEEDS: -No education needs identified at this time   Intake/Output Summary (Last 24 hours) at 01/15/14 1336 Last data filed at 01/15/14 0900  Gross per 24 hour  Intake 4415.61 ml  Output   2390 ml  Net 2025.61 ml     Labs:   Recent Labs Lab 01/14/14 1443 01/14/14 1840 01/15/14 0415  NA 142 139 141  K 4.0 5.5* 5.3  CL  --  108 107  CO2  --  22 20  BUN  --  23 27*  CREATININE  --  2.11* 2.60*  CALCIUM  --  7.4* 8.1*  MG  --  1.6 2.1  PHOS  --   --  4.3  GLUCOSE  --  154* 143*    Scheduled Meds: . antiseptic oral rinse  15 mL Mouth Rinse QID  . chlorhexidine  15 mL Mouth Rinse BID  . ipratropium-albuterol  3 mL Nebulization Q6H  . metoprolol  2.5 mg Intravenous 4 times per day  . nicotine  21 mg Transdermal Daily  . pantoprazole (PROTONIX) IV  40 mg Intravenous Q24H  . valproate sodium  500 mg Intravenous Q12H    Continuous Infusions: . sodium chloride 50 mL/hr at 01/15/14 0900    Past Medical History  Diagnosis Date  . Impaired glucose tolerance 08/27/2011  . BIPOLAR DISORDER UNSPECIFIED 05/25/2009  . COPD 08/22/2010  . CORONARY ARTERY DISEASE 10/20/2009  . HYPERLIPIDEMIA 05/20/2007  . PARKINSON'S DISEASE 05/25/2009  . 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 TARDIVE  DYSKINESIA 05/20/2007  . DEPRESSION 08/18/2008  . GERD 12/01/2008  . HYPERTHYROIDISM 08/18/2008  . Unspecified chronic bronchitis 10/09/2012  . Peripheral vascular disease   . Barrett's esophagus 10/26/13    Dx EGD 10/2013  . Personal history of colonic polyps-adenomas/ssp 10/26/13  . Diverticulosis 10/26/13    mild  . Chronic kidney disease     Stage III   . Complication of anesthesia   . PONV (postoperative nausea and vomiting)     Pt reports nausea only after cardiac cath  . Shortness of breath   . Hypertension     Pt reports that Dr. Cathlean Cower placed him on BP meds temporarily but no longer takes.    Past Surgical History  Procedure Laterality Date  . Spine surgery    . Hernia repair    .  Esophagogastroduodenoscopy    . Colonoscopy w/ biopsies    . Cardiac catheterization  06-16-2009    Dr. Johnsie Cancel  . Abdominal aortic aneurysm repair N/A 01/14/2014    Procedure: ABDOMINAL AORTIC ANEURYSM WITH BI ILIAC REPAIR; PRIMARY REPAIR OF UMBILICAL HERNIA;  Surgeon: Serafina Mitchell, MD;  Location: [facility];  Service: Vascular;  Laterality: N/A;    Arthur Holms, RD, LDN Pager #: [phone] After-Hours Pager #: [phone]

## 2014-01-15 NOTE — Progress Notes (Signed)
Patient is unable to tolerate the CPT.  Patient gets very agitated with an increase in respiratory rate. RT will continue to monitor.

## 2014-01-15 NOTE — Progress Notes (Signed)
CPT stopped after 3 minutes due to increase in BP and RR. Patient returned to full support on ventilator at this time. Will continue to monitor.

## 2014-01-15 NOTE — Progress Notes (Signed)
  [facility] AAA Progress Note  01/15/2014 7:38 AM 1 Day Post-Op  Subjective: Pt alert this am. Intubated.  Responsive to questions. Follows commands. Nods to abdominal pain. Denies pain elsewhere.   Tmax 99.7 BP sys 90s-130s 02 97% Vent  Filed Vitals:   01/15/14 0700  BP: 115/71  Pulse: 84  Temp: 99.7 F (37.6 C)  Resp: 14    Physical Exam: Cardiac:  RRR, no m/g/r Lungs:  Front and lateral clear to auscultation Abdomen:  No bowel sounds heard. No distension. Diffuse tenderness to palpation.  Incisions:  Midline abdominal incision clean, dry and intact  Extremities:  Palpable right femoral pulse. Unable to palpate left femoral. Palpable DP pulses bilaterally. No ischemic changes.  Neuro: Jerking motions observed, moves all extremities.   CBC    Component Value Date/Time   WBC 13.8* 01/15/2014 0415   RBC 3.38* 01/15/2014 0415   HGB 11.3* 01/15/2014 0415   HCT 33.0* 01/15/2014 0415   PLT 156 01/15/2014 0415   MCV 97.6 01/15/2014 0415   MCH 33.4 01/15/2014 0415   MCHC 34.2 01/15/2014 0415   RDW 15.3 01/15/2014 0415   LYMPHSABS 0.6* 01/15/2014 0415   MONOABS 2.1* 01/15/2014 0415   EOSABS 0.0 01/15/2014 0415   BASOSABS 0.0 01/15/2014 0415    BMET    Component Value Date/Time   NA 141 01/15/2014 0415   K 5.3 01/15/2014 0415   CL 107 01/15/2014 0415   CO2 20 01/15/2014 0415   GLUCOSE 143* 01/15/2014 0415   BUN 27* 01/15/2014 0415   CREATININE 2.60* 01/15/2014 0415   CREATININE 1.87* 12/19/2013 1159   CALCIUM 8.1* 01/15/2014 0415   GFRNONAA 26* 01/15/2014 0415   GFRAA 30* 01/15/2014 0415    INR    Component Value Date/Time   INR 1.08 01/14/2014 1840     Intake/Output Summary (Last 24 hours) at 01/15/14 0738 Last data filed at 01/15/14 0700  Gross per 24 hour  Intake 6589.91 ml  Output   3645 ml  Net 2944.91 ml     Assessment/Plan:  58 y.o. male is s/p  Open repair of infrarenal abdominal aortic aneurysm using a bifurcated 20 x 10 dacryon graft Primary repair of umbilical  hernia  1 Day Post-Op -Leukocytosis: 13.8 this am. Low grade fever this morning 99.7. CXR today with RML infiltrate. Will start abx.  -Acute on chronic renal failure: creatinine baseline 2. Rising to 2.6 today.  -Lactic acidosis: will recheck labs in am  -Acute surgical blood loss anemia: Hgb stable this am. Received one unit yesterday during surgery. Will continue to monitor.   -BP stable after one fluid bolus yesterday evening.  -Hypokalemia: corrected this am. Magnesium replenished.  -Continue NG tube -Good UOP -Neuro: Continue valproate and haldol. Will restart fluoxetine when taking PO's.  -Appreciate CCM following. Will wean off vent and extubate today.      Virgina Jock, PA-C [facility] Office: [phone] Pager: [phone] 01/15/2014 7:38 AM  I agree witht the above Renal disease:  Creatinine up today but good UOP adequate, continue to monitor, renal dose meds  GI:  Keep NG in place, very difficult to put in.  Keep NPO Pulm:  Appreciate CCM support.  Hopefully can extubate today ID; No infectious issues Acute blood loss anemia:  HCT stable this am, received 1 unit PRBC and cell saver intra-op   Wells Miho Monda

## 2014-01-15 NOTE — Progress Notes (Signed)
CPT stopped again after 3 minutes due to agitation and increase in BP. RN aware.

## 2014-01-15 NOTE — Procedures (Signed)
Extubation Procedure Note  Patient Details:   Name: Ruben Blackwell DOB: 04-Feb-1956 MRN: [id number]   Airway Documentation:     Evaluation  O2 sats: stable throughout Complications: No apparent complications Patient did tolerate procedure well. Bilateral Breath Sounds: Clear;Diminished Suctioning: Airway Yes Patient tolerated wean. MD ordered to extubate. Positive for cuff leak. Patient extubated to 4 Lpm nasal cannula. No signs of dyspnea or stridor. Patient instructed on the Incentive Spirometer achieving 750 mL, times 5, with good patient effort. Patient resting comfortably. Will continue to monitor.  Orene Desanctis 01/15/2014, 5:06 PM

## 2014-01-15 NOTE — Progress Notes (Signed)
OT Cancellation Note  Patient Details Name: Ruben Blackwell MRN: [id number] DOB: September 10, 1955   Cancelled Treatment:    Reason Eval/Treat Not Completed: Patient not medically ready  Peri Maris Pager: [phone]  01/15/2014, 11:23 AM

## 2014-01-15 NOTE — Progress Notes (Addendum)
PULMONARY / CRITICAL CARE MEDICINE   Name: Ruben Blackwell MRN: [id number] DOB: [birth year]    ADMISSION DATE:  01/14/2014 CONSULTATION DATE:  01/14/2014  REFERRING MD :  Trula Slade PRIMARY SERVICE: Vascular Surgery  CHIEF COMPLAINT:  On vent s/p AAA repair  BRIEF PATIENT DESCRIPTION: 58 year old male with multiple medical problemsHTN, CKD III, Hyperthyroid, tardive dyskinesia, and complex psych history  presented 6/4 for elective AAA 6.4cm repair.Or couse complicated by bleeding and s/p 1 unit prbc per-operatively.  Remained on the ventilator post-operatively (reintubated, Per anesthesia: " Patient with upper airway obstruction and suboptimal tidal volumes. Reintubated"  PCCM asked to consult for vent/ICU management.   has a past medical history of Impaired glucose tolerance (08/27/2011); BIPOLAR DISORDER UNSPECIFIED (05/25/2009); COPD (08/22/2010); CORONARY ARTERY DISEASE (10/20/2009); HYPERLIPIDEMIA (05/20/2007); PARKINSON'S DISEASE (05/25/2009); SCHIZOAFFECTIVE DISORDER (05/20/2007); TARDIVE DYSKINESIA (05/20/2007); DEPRESSION (08/18/2008); GERD (12/01/2008); HYPERTHYROIDISM (08/18/2008); Unspecified chronic bronchitis (10/09/2012); Peripheral vascular disease; Barrett's esophagus (10/26/13); Personal history of colonic polyps-adenomas/ssp (10/26/13); Diverticulosis (10/26/13); Chronic kidney disease; Complication of anesthesia; PONV (postoperative nausea and vomiting); Shortness of breath; and Hypertension.   has past surgical history that includes Spine surgery; Hernia repair; Esophagogastroduodenoscopy; Colonoscopy w/ biopsies; and Cardiac catheterization (06-16-2009).    SIGNIFICANT EVENTS / STUDIES:  5/14 - CT Abd > Bilobed infrarenal aortic aneurysm, 6.4 cm maximum diameter, without iliac involvement. Colonic diverticulosis. 6/4 - Elective AAA repair  LINES / TUBES: OETT 6/4 >>>6/4, 6/4 >> Foley 6/4 >>> NGT 6/4 >>> RIJ PA cath 6/4 >>> Art line 6/4 >>>  CULTURES: n/a  ANTIBIOTICS: periop  cefuroxime   SUBJECTIVE:   01/15/14: Doing SBT. CXR still with RML infiltrate/collapse.   VITAL SIGNS: Temp:  [98.2 F (36.8 C)-99.7 F (37.6 C)] 99.7 F (37.6 C) (06/05 0800) Pulse Rate:  [77-98] 93 (06/05 0800) Resp:  [12-21] 15 (06/05 0800) BP: (97-143)/(58-110) 139/77 mmHg (06/05 0800) SpO2:  [97 %-100 %] 97 % (06/05 0906) Arterial Line BP: (94-157)/(53-150) 153/150 mmHg (06/05 0800) FiO2 (%):  [40 %-50 %] 40 % (06/05 0906) Weight:  [71.1 kg (156 lb 12 oz)-76.9 kg (169 lb 8.5 oz)] 76.9 kg (169 lb 8.5 oz) (06/05 0600) HEMODYNAMICS: PAP: (17-35)/(8-21) 29/15 mmHg CO:  [4.3 L/min-6.4 L/min] 6.4 L/min CI:  [2.3 L/min/m2-3.4 L/min/m2] 3.4 L/min/m2 VENTILATOR SETTINGS: Vent Mode:  [-] PSV;CPAP FiO2 (%):  [40 %-50 %] 40 % Set Rate:  [12 bmp] 12 bmp Vt Set:  [560 mL] 560 mL PEEP:  [5 cmH20] 5 cmH20 Pressure Support:  [5 cmH20-10 cmH20] 5 cmH20 Plateau Pressure:  [13 cmH20-16 cmH20] 16 cmH20 INTAKE / OUTPUT: Intake/Output     06/04 0701 - 06/05 0700 06/05 0701 - 06/06 0700   I.V. (mL/kg) 4819.9 (62.7) 110.7 (1.4)   Blood 835    NG/GT 30    IV Piggyback 905    Total Intake(mL/kg) 6589.9 (85.7) 110.7 (1.4)   Urine (mL/kg/hr) 2295 125 (0.6)   Blood 1400    Total Output 3695 125   Net +2894.9 -14.3          PHYSICAL EXAMINATION: General:  Intubated male in NAD Neuro:  Awake, alert, follows commands but when awake he has significant tardive dyskinesia HEENT:  [place]/AT, no JVD noted Cardiovascular: RRR, positive pedal pulses b/l Lungs:  Clear anterior. Abdomen:  +BS, pain at surgical site.  Musculoskeletal:  No acute deformity. Jerking movements of all 4 extremities reportedly due to tardive dyskinesia.  Skin:  Intact anteriorly  LABS:  PULMONARY  Recent Labs Lab 01/14/14 1443 01/15/14 0403  PHART  7.337* 7.294*  PCO2ART 42.4 39.9  PO2ART 225.0* 115.0*  HCO3 22.7 19.2*  TCO2 24 20  O2SAT 100.0 98.0    CBC  Recent Labs Lab 01/14/14 1443 01/14/14 1840  01/15/14 0415  HGB 9.5* 11.6* 11.3*  HCT 28.0* 34.3* 33.0*  WBC  --  13.4* 13.8*  PLT  --  170 156    COAGULATION  Recent Labs Lab 01/14/14 1840  INR 1.08    CARDIAC    Recent Labs Lab 01/14/14 2005 01/15/14 0500  TROPONINI <0.30 <0.30   No results found for this basename: PROBNP,  in the last 168 hours   CHEMISTRY  Recent Labs Lab 01/14/14 1443 01/14/14 1840 01/15/14 0415  NA 142 139 141  K 4.0 5.5* 5.3  CL  --  108 107  CO2  --  22 20  GLUCOSE  --  154* 143*  BUN  --  23 27*  CREATININE  --  2.11* 2.60*  CALCIUM  --  7.4* 8.1*  MG  --  1.6 2.1  PHOS  --   --  4.3   Estimated Creatinine Clearance: 31.6 ml/min (by C-G formula based on Cr of 2.6).   LIVER  Recent Labs Lab 01/14/14 1840 01/15/14 0415  AST  --  20  ALT  --  10  ALKPHOS  --  39  BILITOT  --  0.2*  PROT  --  5.1*  ALBUMIN  --  2.5*  INR 1.08  --      INFECTIOUS  Recent Labs Lab 01/14/14 2005 01/15/14 0415  LATICACIDVEN 0.9 2.3*     ENDOCRINE CBG (last 3)  No results found for this basename: GLUCAP,  in the last 72 hours       IMAGING x48h  Dg Chest Port 1 View  01/15/2014   CLINICAL DATA:  Check endotracheal tube placement  EXAM: PORTABLE CHEST - 1 VIEW  COMPARISON:  01/14/2014  FINDINGS: Cardiac shadow is stable. An endotracheal tube is again seen 4.6 cm above the carina. Swan-Ganz catheter is noted in the pulmonary outflow tract. A nasogastric catheter is seen within the stomach. The lungs are well aerated with the exception of the right middle lobe which demonstrates increasing consolidation.  IMPRESSION: Increasing right middle lobe infiltrate.  Tubes and lines described, stable from the prior study.   Electronically Signed   By: Inez Catalina M.D.   On: 01/15/2014 07:56   Dg Chest Portable 1 View  01/14/2014   CLINICAL DATA:  Post triple AAA repair, re-intubation  EXAM: PORTABLE CHEST - 1 VIEW  COMPARISON:  01/07/2014; chest CT - 12/21/2013  FINDINGS: Apparent  enlargement of the cardiac silhouette and mediastinal contours is favored to be secondary to decreased lung volumes and patient rotation. Endotracheal tube overlies the tracheal air column with tip approximately 5.4 cm above the carina. Enteric tube tip projects below the left hemidiaphragm. Right jugular approach PA catheter with tip coiled over the expected location of the main pulmonary artery outflow tract. There is partial atelectasis of the right lower lobe. No pleural effusion. No definite evidence of edema. Unchanged bones.  IMPRESSION: 1. Right jugular approach PA catheter tip appears coiled over the main pulmonary artery. Repositioning is advised. 2. Otherwise, appropriately positioned support apparatus as above. No supine evidence of pneumothorax. 3. Suspected at least partial atelectasis of the right middle lobe. Otherwise, no acute cardiopulmonary disease. Specifically, no evidence of edema.   Electronically Signed   By: Eldridge Abrahams.D.  On: 01/14/2014 17:22   Dg Abd Portable 1v  01/14/2014   CLINICAL DATA:  Incorrect instrument count.  EXAM: PORTABLE ABDOMEN - 1 VIEW  COMPARISON:  May 15, 2005.  FINDINGS: Nasogastric tube tip seen in proximal stomach. Surgical clips seen in left paraspinal region. No abnormal bowel gas pattern is noted. No abnormal calcifications are noted. No evidence of retained surgical instrument is identified in the included field of view.  IMPRESSION: No evidence of retained instrument or other radiopaque foreign body.   Electronically Signed   By: Sabino Dick M.D.   On: 01/14/2014 15:49      ASSESSMENT / PLAN:  PULMONARY A: Acute Post OP Respiratory Failure with reintubation due to lethargy. Ass Possible RML collapse +  in setting of post operative period.     - doing well on sBT. WUA ok with baseline tardive dyskinesia. RML infiltrate/collapse persists . However, has new acidosis  P:   Consider extubation if repeat abg, lactate, and sbt go well Pulmonary  hygiene; chest PT; do one now (none received last night) CXR in am  CARDIOVASCULAR A:  S/p AAA repair and s/p 1 unit prbc perop  P:  Management per Vascular Surgery SBP goal less than 140, PRN labetalol, hydralazine Check CBC, May need Blood if hgb < 7gm% or hemodynamic instability   RENAL A:   CKD III baseline creat 2   - worsening creat - Acute on Chronic Kidney Injury, with new onset lactic acidosis (mild) post op  P:   Fluid bolus stat Repeat abg, lactate, ck - rule out diprivan infusion syndrome Check BMP Hydrate  GASTROINTESTINAL A:   GERD  P:   PPI  HEMATOLOGIC A:   Likely acute blood loss anemia; sp 1 unit prbc in OR VTE ppx per surgery   - post op cbc looks ok  P:  CBC in process May require transfusion  INFECTIOUS A:   No acute evidence of infection but has RML infiltrate/collapse post op  P:   Check PCT and if high start abxc Monitor WBC and fever curve.  ENDOCRINE A:   No acute issues  P:   Check glucose on Bmet  NEUROLOGIC A:   Acute encephalopathy in setting of post operative period.  Bipolar disorder Schizoaffective disorder Depression Parkinson's Disease Tardive Dyskinesia  But on baseline haldol   - when awake he has significant tardive dyskinesia  P:   DC dilaudid (in case he is devloping PRIS) Continue home valproic acid, haldol (he is on haldol despite tardive dyskinesia) Hold fluoxetine until taking PO Monitor No morphine for pain in setting of renal failure Dilaudid prn for pain   GLOBAL 01/15/14: No family at bedside  The patient is critically ill with multiple organ systems failure and requires high complexity decision making for assessment and support, frequent evaluation and titration of therapies, application of advanced monitoring technologies and extensive interpretation of multiple databases.   Critical Care Time devoted to patient care services described in this note is  35  Minutes.  Dr. Brand Males, M.D., [facility].C.P Pulmonary and Critical Care Medicine Staff Physician [facility] Pager: [phone], If no answer or between  15:00h - 7:00h: call [phone]  01/15/2014 9:37 AM

## 2014-01-15 NOTE — Progress Notes (Signed)
PT Cancellation Note  Patient Details Name: Ruben Blackwell MRN: [id number] DOB: Sep 23, 1955   Cancelled Treatment:    Reason Eval/Treat Not Completed: Patient not medically ready   [place] 01/15/2014, 10:34 AM

## 2014-01-15 NOTE — Progress Notes (Signed)
Utilization review completed. Brittin Belnap, RN, BSN. 

## 2014-01-16 ENCOUNTER — Inpatient Hospital Stay (HOSPITAL_COMMUNITY): Payer: Medicare Other

## 2014-01-16 DIAGNOSIS — J438 Other emphysema: Secondary | ICD-10-CM

## 2014-01-16 DIAGNOSIS — E872 Acidosis, unspecified: Secondary | ICD-10-CM

## 2014-01-16 DIAGNOSIS — F3289 Other specified depressive episodes: Secondary | ICD-10-CM

## 2014-01-16 DIAGNOSIS — N189 Chronic kidney disease, unspecified: Secondary | ICD-10-CM

## 2014-01-16 DIAGNOSIS — G244 Idiopathic orofacial dystonia: Secondary | ICD-10-CM

## 2014-01-16 DIAGNOSIS — N179 Acute kidney failure, unspecified: Secondary | ICD-10-CM

## 2014-01-16 DIAGNOSIS — F329 Major depressive disorder, single episode, unspecified: Secondary | ICD-10-CM

## 2014-01-16 LAB — CBC WITH DIFFERENTIAL/PLATELET
BASOS ABS: 0 10*3/uL (ref 0.0–0.1)
Basophils Relative: 0 % (ref 0–1)
EOS PCT: 0 % (ref 0–5)
Eosinophils Absolute: 0 10*3/uL (ref 0.0–0.7)
HCT: 28.6 % — ABNORMAL LOW (ref 39.0–52.0)
Hemoglobin: 9.6 g/dL — ABNORMAL LOW (ref 13.0–17.0)
Lymphocytes Relative: 7 % — ABNORMAL LOW (ref 12–46)
Lymphs Abs: 1.2 10*3/uL (ref 0.7–4.0)
MCH: 33.1 pg (ref 26.0–34.0)
MCHC: 33.6 g/dL (ref 30.0–36.0)
MCV: 98.6 fL (ref 78.0–100.0)
MONO ABS: 1.5 10*3/uL — AB (ref 0.1–1.0)
Monocytes Relative: 9 % (ref 3–12)
NEUTROS PCT: 84 % — AB (ref 43–77)
Neutro Abs: 14.1 10*3/uL — ABNORMAL HIGH (ref 1.7–7.7)
PLATELETS: 102 10*3/uL — AB (ref 150–400)
RBC: 2.9 MIL/uL — ABNORMAL LOW (ref 4.22–5.81)
RDW: 15.1 % (ref 11.5–15.5)
WBC: 16.8 10*3/uL — AB (ref 4.0–10.5)

## 2014-01-16 LAB — BASIC METABOLIC PANEL
BUN: 31 mg/dL — AB (ref 6–23)
CO2: 23 meq/L (ref 19–32)
CREATININE: 2.67 mg/dL — AB (ref 0.50–1.35)
Calcium: 8.9 mg/dL (ref 8.4–10.5)
Chloride: 119 mEq/L — ABNORMAL HIGH (ref 96–112)
GFR calc non Af Amer: 25 mL/min — ABNORMAL LOW (ref >90)
GFR, EST AFRICAN AMERICAN: 29 mL/min — AB (ref >90)
Glucose, Bld: 103 mg/dL — ABNORMAL HIGH (ref 70–99)
Potassium: 4 mEq/L (ref 3.7–5.3)
Sodium: 153 mEq/L — ABNORMAL HIGH (ref 137–147)

## 2014-01-16 LAB — LACTIC ACID, PLASMA: Lactic Acid, Venous: 0.8 mmol/L (ref 0.5–2.2)

## 2014-01-16 LAB — PROCALCITONIN: Procalcitonin: 1.75 ng/mL

## 2014-01-16 LAB — MAGNESIUM: MAGNESIUM: 2.3 mg/dL (ref 1.5–2.5)

## 2014-01-16 LAB — PHOSPHORUS: Phosphorus: 3.6 mg/dL (ref 2.3–4.6)

## 2014-01-16 MED ORDER — SODIUM CHLORIDE 0.9 % IV SOLN
1.5000 g | Freq: Three times a day (TID) | INTRAVENOUS | Status: DC
  Administered 2014-01-16 – 2014-01-17 (×3): 1.5 g via INTRAVENOUS
  Filled 2014-01-16 (×4): qty 1.5

## 2014-01-16 NOTE — Progress Notes (Signed)
ANTIBIOTIC CONSULT NOTE - INITIAL  Pharmacy Consult for Unasyn Indication: aspiration pneumonia  Allergies  Allergen Reactions  . Sulfonamide Derivatives     REACTION: Reaction not known   Patient Measurements: Height: 5' 8.5" (174 cm) Weight: 169 lb 8.5 oz (76.9 kg) IBW/kg (Calculated) : 69.56  Vital Signs: Temp: 99.1 F (37.3 C) (06/06 0800) Temp src: Core (Comment) (06/06 0800) BP: 134/82 mmHg (06/06 0800) Pulse Rate: 106 (06/06 0800) Intake/Output from previous day: 06/05 0701 - 06/06 0700 In: 1350.7 [I.V.:1210.7; NG/GT:30; IV Piggyback:110] Out: 1941 [Urine:4125; Emesis/NG output:300] Intake/Output from this shift: Total I/O In: 70 [I.V.:50; NG/GT:20] Out: -   Labs:  Recent Labs  01/14/14 1840 01/15/14 0415 01/16/14 0348  WBC 13.4* 13.8* 16.8*  HGB 11.6* 11.3* 9.6*  PLT 170 156 102*  CREATININE 2.11* 2.60*  --    Estimated Creatinine Clearance: 31.6 ml/min (by C-G formula based on Cr of 2.6). No results found for this basename: VANCOTROUGH, Corlis Leak, VANCORANDOM, GENTTROUGH, GENTPEAK, GENTRANDOM, TOBRATROUGH, TOBRAPEAK, TOBRARND, AMIKACINPEAK, AMIKACINTROU, AMIKACIN,  in the last 72 hours   Microbiology: Recent Results (from the past 720 hour(s))  SURGICAL PCR SCREEN     Status: None   Collection Time    01/07/14 12:49 PM      Result Value Ref Range Status   MRSA, PCR NEGATIVE  NEGATIVE Final   Staphylococcus aureus NEGATIVE  NEGATIVE Final   Comment:            The Xpert SA Assay (FDA     approved for NASAL specimens     in patients over 26 years of age),     is one component of     a comprehensive surveillance     program.  Test performance has     been validated by [employer] for patients greater     than or equal to 83 year old.     It is not intended     to diagnose infection nor to     guide or monitor treatment.    Medical History: Past Medical History  Diagnosis Date  . Impaired glucose tolerance 08/27/2011  . BIPOLAR DISORDER  UNSPECIFIED 05/25/2009  . COPD 08/22/2010  . CORONARY ARTERY DISEASE 10/20/2009  . HYPERLIPIDEMIA 05/20/2007  . PARKINSON'S DISEASE 05/25/2009  . 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 TARDIVE DYSKINESIA 05/20/2007  . DEPRESSION 08/18/2008  . GERD 12/01/2008  . HYPERTHYROIDISM 08/18/2008  . Unspecified chronic bronchitis 10/09/2012  . Peripheral vascular disease   . Barrett's esophagus 10/26/13    Dx EGD 10/2013  . Personal history of colonic polyps-adenomas/ssp 10/26/13  . Diverticulosis 10/26/13    mild  . Shortness of breath   . Hypertension     Pt reports that Dr. Cathlean Cower placed him on BP meds temporarily but no longer takes.  Marland Kitchen PONV (postoperative nausea and vomiting)     Pt reports nausea only after cardiac cath  . Anxiety   . CKD (chronic kidney disease), stage III   . Cancer     "precancer; my colon and esophagus" (01/15/2014)  . AAA (abdominal aortic aneurysm)   . History of blood transfusion 01/14/2014    related to AAA repair/notes 01/15/2014    Medications:  Scheduled:  . antiseptic oral rinse  15 mL Mouth Rinse QID  . chlorhexidine  15 mL Mouth Rinse BID  . ipratropium-albuterol  3 mL Nebulization Q6H  . metoprolol  2.5 mg Intravenous 4 times per day  . nicotine  21 mg Transdermal Daily  . pantoprazole (PROTONIX) IV  40 mg Intravenous Q24H  . valproate sodium  500 mg Intravenous Q12H   Infusions:  . sodium chloride 50 mL/hr at 01/16/14 0800   Assessment: 58 yo M originally presented for elective abdominal aortic aneurysm repair.  Patient remains on ventilator post-operatively (was re-intubated per anesthesia).  Pharmacy is consulted to dose Unasyn for aspiration pneumonia.  He is currently afebrile with a Tmax of 100.2.  WBC is up to 16.8.  SCr was trending up yesterday to 2.6 with a CrCl ~32.    Cefuroxime >> peri-op dose Unasyn 6/6 >>  No cultures  Goal of Therapy:  Resolution of infection  Plan:  - start Unasyn IV 1.5gm q8h - monitor kidney function, WBC,  temperature curve, any cultures, and clinical progression   Ovid Curd E. Jacqlyn Larsen, PharmD Clinical Pharmacist - Resident Pager: [phone] Pharmacy: [phone] 01/16/2014 9:48 AM

## 2014-01-16 NOTE — Evaluation (Signed)
Physical Therapy Evaluation Patient Details Name: Ruben Blackwell MRN: [id number] DOB: [birth year] Today's Date: 01/16/2014   History of Present Illness  Pt. Is a 58 year old male admitted for elective AAA repair with history of HTN, tardive dyskinesia, COPD,CKD III, and complex psych history.   Pt. Was extubated on 01/15/14.    Clinical Impression  Pt. Presents to PT with a decrease in his usual level of independent "in the home" function.  He will benefit from acute PT to address his immediate mobility issues and would benefit from CIR screen for appropriateness for CIR so that he can eventually return home alone with his cat.  He does not drive, and says his sister can help him until 6/20 at which time she will be going on a trip.  Acute PT to follow.    Follow Up Recommendations CIR    Equipment Recommendations  Rolling walker with 5" wheels    Recommendations for Other Services       Precautions / Restrictions Precautions Precautions: Fall Restrictions Weight Bearing Restrictions: No      Mobility  Bed Mobility Overal bed mobility: Needs Assistance Bed Mobility: Supine to Sit     Supine to sit: +2 for physical assistance;Mod assist     General bed mobility comments: mod assist of 2 especially to move side to sit  Transfers Overall transfer level: Needs assistance Equipment used: None Transfers: Sit to/from [employer] Transfers Sit to Stand: +2 physical assistance;Mod assist Stand pivot transfers: +2 physical assistance;Mod assist       General transfer comment: pt. with difficulty with transition partly due to overall weakness and partly due to interference from his tartive dyskinesia; stood and pivoted with 2 mod assist for upright support and to transition to chair with guidance at hips  Ambulation/Gait Ambulation/Gait assistance:  (pt. unalb)              Stairs            Wheelchair Mobility    Modified Rankin (Stroke Patients Only)        Balance Overall balance assessment: Needs assistance         Standing balance support: Bilateral upper extremity supported;During functional activity Standing balance-Leahy Scale: Poor                               Pertinent Vitals/Pain See vitals tab Pt. Was not in distress during the session and has abdominal surgical pain with moving around.    Home Living Family/patient expects to be discharged to:: Private residence Living Arrangements: Alone Available Help at Discharge: Family;Available 24 hours/day;Other (Comment) (sister can help per pt. until 6/20) Type of Home: House Home Access: Stairs to enter Entrance Stairs-Rails: None Entrance Stairs-Number of Steps: 4 Home Layout: One level Home Equipment: None      Prior Function Level of Independence: Independent         Comments: goes out to eat most of time, drives     Hand Dominance   Dominant Hand: Right    Extremity/Trunk Assessment   Upper Extremity Assessment: Generalized weakness           Lower Extremity Assessment: Generalized weakness      Cervical / Trunk Assessment: Normal  Communication      Cognition Arousal/Alertness: Awake/alert Behavior During Therapy: WFL for tasks assessed/performed Overall Cognitive Status: No family/caregiver present to determine baseline cognitive functioning (pt. able to follow directions, seemed  slightly confused )                      General Comments General comments (skin integrity, edema, etc.): both LEs are edematous and with nodular  areas covering   lower legs    Exercises        Assessment/Plan    PT Assessment Patient needs continued PT services  PT Diagnosis Difficulty walking;Generalized weakness;Acute pain   PT Problem List Decreased strength;Decreased activity tolerance;Decreased balance;Decreased mobility;Decreased knowledge of use of DME;Decreased safety awareness  PT Treatment Interventions DME  instruction;Gait training;Functional mobility training;Therapeutic activities;Therapeutic exercise;Balance training;Patient/family education   PT Goals (Current goals can be found in the Care Plan section) Acute Rehab PT Goals Patient Stated Goal: to get home to his cat PT Goal Formulation: With patient Time For Goal Achievement: 01/30/14 Potential to Achieve Goals: Fair    Frequency Min 3X/week   Barriers to discharge Decreased caregiver support      Co-evaluation               End of Session Equipment Utilized During Treatment: Gait belt;Other (comment) (worn high on chest) Activity Tolerance: Patient tolerated treatment well Patient left: in chair;with call bell/phone within reach;with nursing/sitter in room Nurse Communication: Mobility status         Time: 3474-2595 PT Time Calculation (min): 21 min   Charges:   PT Evaluation $Initial PT Evaluation Tier I: 1 Procedure PT Treatments $Therapeutic Activity: 8-22 mins   PT G Codes:          Ladona Ridgel 01/16/2014, 1:33 PM Gerlean Ren PT Acute Rehab Services [phone] [place] [phone]

## 2014-01-16 NOTE — Progress Notes (Signed)
PULMONARY / CRITICAL CARE MEDICINE   Name: Ruben Blackwell MRN: [id number] DOB: Sep 02, 1955    ADMISSION DATE:  01/14/2014 CONSULTATION DATE:  01/14/2014  REFERRING MD :  Trula Slade PRIMARY SERVICE: Vascular Surgery  CHIEF COMPLAINT:  On vent s/p AAA repair  BRIEF PATIENT DESCRIPTION: 58 year old male with multiple medical problemsHTN, CKD III, Hyperthyroid, tardive dyskinesia, and complex psych history  presented 6/4 for elective AAA 6.4cm repair.Or couse complicated by bleeding and s/p 1 unit prbc per-operatively.  Remained on the ventilator post-operatively (reintubated, Per anesthesia: " Patient with upper airway obstruction and suboptimal tidal volumes. Reintubated"  PCCM asked to consult for vent/ICU management.   has a past medical history of Impaired glucose tolerance (08/27/2011); BIPOLAR DISORDER UNSPECIFIED (05/25/2009); COPD (08/22/2010); CORONARY ARTERY DISEASE (10/20/2009); HYPERLIPIDEMIA (05/20/2007); PARKINSON'S DISEASE (05/25/2009); SCHIZOAFFECTIVE DISORDER (05/20/2007); TARDIVE DYSKINESIA (05/20/2007); DEPRESSION (08/18/2008); GERD (12/01/2008); HYPERTHYROIDISM (08/18/2008); Unspecified chronic bronchitis (10/09/2012); Peripheral vascular disease; Barrett's esophagus (10/26/13); Personal history of colonic polyps-adenomas/ssp (10/26/13); Diverticulosis (10/26/13); Shortness of breath; Hypertension; PONV (postoperative nausea and vomiting); Anxiety; CKD (chronic kidney disease), stage III; Cancer; AAA (abdominal aortic aneurysm); and History of blood transfusion (01/14/2014).   has past surgical history that includes Pilonidal cyst / sinus excision; Esophagogastroduodenoscopy; Colonoscopy w/ biopsies; Abdominal aortic aneurysm repair (N/A, 01/14/2014); Hernia repair; Cardiac catheterization (06-16-2009); and Esophageal dilation.    SIGNIFICANT EVENTS / STUDIES:  5/14 - CT Abd > Bilobed infrarenal aortic aneurysm, 6.4 cm maximum diameter, without iliac involvement. Colonic diverticulosis. 6/4 - Elective  AAA repair  LINES / TUBES: OETT 6/4 >>>6/4, 6/4 >>01/16/14 Foley 6/4 >>> NGT 6/4 >>> RIJ PA cath 6/4 >>> Art line 6/4 >>>  CULTURES: n/a  ANTIBIOTICS: periop cefuroxime   SUBJECTIVE:    01/16/14: Extubated yesterday. RML collpase improved with chest PT. Ongoing tardive dyskinesia. Reports that his emotional needs are high  VITAL SIGNS: Temp:  [99.1 F (37.3 C)-100.2 F (37.9 C)] 99.1 F (37.3 C) (06/06 0800) Pulse Rate:  [95-119] 106 (06/06 0800) Resp:  [15-38] 26 (06/06 0800) BP: (123-192)/(67-112) 134/82 mmHg (06/06 0800) SpO2:  [95 %-100 %] 98 % (06/06 0917) Arterial Line BP: (101-221)/(63-110) 168/87 mmHg (06/06 0800) FiO2 (%):  [40 %] 40 % (06/05 1600) HEMODYNAMICS: PAP: (23-54)/(2-29) 47/29 mmHg CO:  [7 L/min-10.7 L/min] 10 L/min CI:  [3.8 L/min/m2-5.8 L/min/m2] 5.4 L/min/m2 VENTILATOR SETTINGS: Vent Mode:  [-] PSV;CPAP FiO2 (%):  [40 %] 40 % Set Rate:  [12 bmp] 12 bmp Vt Set:  [560 mL] 560 mL PEEP:  [5 cmH20] 5 cmH20 Pressure Support:  [5 cmH20] 5 cmH20 INTAKE / OUTPUT: Intake/Output     06/05 0701 - 06/06 0700 06/06 0701 - 06/07 0700   I.V. (mL/kg) 1210.7 (15.7) 50 (0.7)   Blood     NG/GT 30 20   IV Piggyback 110    Total Intake(mL/kg) 1350.7 (17.6) 70 (0.9)   Urine (mL/kg/hr) 4125 (2.2)    Emesis/NG output 300 (0.2)    Blood     Total Output 4425     Net -3074.3 +70          PHYSICAL EXAMINATION: General:  Looks better Neuro: o:  Awake, alert, follows commands but when awake he has significant tardive dyskinesia HEENT:  [place]/AT, no JVD noted Cardiovascular: RRR, positive pedal pulses b/l Lungs:  Clear anterior. Abdomen:  +BS, pain at surgical site.  Musculoskeletal:  No acute deformity. Jerking movements of all 4 extremities reportedly due to tardive dyskinesia.  Skin:  Intact anteriorly  LABS:  PULMONARY  Recent  Labs Lab 01/14/14 1443 01/15/14 0403 01/15/14 1008  PHART 7.337* 7.294* 7.321*  PCO2ART 42.4 39.9 41.5  PO2ART 225.0*  115.0* 97.0  HCO3 22.7 19.2* 21.3  TCO2 24 20 22   O2SAT 100.0 98.0 97.0    CBC  Recent Labs Lab 01/14/14 1840 01/15/14 0415 01/16/14 0348  HGB 11.6* 11.3* 9.6*  HCT 34.3* 33.0* 28.6*  WBC 13.4* 13.8* 16.8*  PLT 170 156 102*    COAGULATION  Recent Labs Lab 01/14/14 1840  INR 1.08    CARDIAC    Recent Labs Lab 01/14/14 2005 01/15/14 0500 01/15/14 1146  TROPONINI <0.30 <0.30 <0.30    Recent Labs Lab 01/15/14 0942  PROBNP 136.7*     CHEMISTRY  Recent Labs Lab 01/14/14 1443 01/14/14 1840 01/15/14 0415 01/16/14 0348  NA 142 139 141  --   K 4.0 5.5* 5.3  --   CL  --  108 107  --   CO2  --  22 20  --   GLUCOSE  --  154* 143*  --   BUN  --  23 27*  --   CREATININE  --  2.11* 2.60*  --   CALCIUM  --  7.4* 8.1*  --   MG  --  1.6 2.1 2.3  PHOS  --   --  4.3 3.6   Estimated Creatinine Clearance: 31.6 ml/min (by C-G formula based on Cr of 2.6).   LIVER  Recent Labs Lab 01/14/14 1840 01/15/14 0415  AST  --  20  ALT  --  10  ALKPHOS  --  39  BILITOT  --  0.2*  PROT  --  5.1*  ALBUMIN  --  2.5*  INR 1.08  --      INFECTIOUS  Recent Labs Lab 01/14/14 2005 01/15/14 0415 01/15/14 0942 01/15/14 1146 01/16/14 0348  LATICACIDVEN 0.9 2.3* 2.3*  --   --   PROCALCITON  --   --   --  1.39 1.75     ENDOCRINE CBG (last 3)  No results found for this basename: GLUCAP,  in the last 72 hours       IMAGING x48h  Dg Chest Port 1 View  01/16/2014   CLINICAL DATA:  Check ETT  EXAM: PORTABLE CHEST - 1 VIEW  COMPARISON:  01/15/2014  FINDINGS: Mild patchy bilateral lower lobe opacities, likely atelectasis. No pleural effusion or pneumothorax.  The heart is normal in size.  Interval extubation. Stable right IJ venous catheter terminating in the main pulmonary artery. Enteric tube courses below the diaphragm.  IMPRESSION: Interval extubation.  Additional support apparatus as above.  Mild patchy bilateral lower lobe opacities, likely atelectasis.    Electronically Signed   By: Julian Hy M.D.   On: 01/16/2014 08:19   Dg Chest Port 1 View  01/15/2014   CLINICAL DATA:  Check endotracheal tube placement  EXAM: PORTABLE CHEST - 1 VIEW  COMPARISON:  01/14/2014  FINDINGS: Cardiac shadow is stable. An endotracheal tube is again seen 4.6 cm above the carina. Swan-Ganz catheter is noted in the pulmonary outflow tract. A nasogastric catheter is seen within the stomach. The lungs are well aerated with the exception of the right middle lobe which demonstrates increasing consolidation.  IMPRESSION: Increasing right middle lobe infiltrate.  Tubes and lines described, stable from the prior study.   Electronically Signed   By: Inez Catalina M.D.   On: 01/15/2014 07:56   Dg Chest Portable 1 View  01/14/2014   CLINICAL DATA:  Post triple AAA repair, re-intubation  EXAM: PORTABLE CHEST - 1 VIEW  COMPARISON:  01/07/2014; chest CT - 12/21/2013  FINDINGS: Apparent enlargement of the cardiac silhouette and mediastinal contours is favored to be secondary to decreased lung volumes and patient rotation. Endotracheal tube overlies the tracheal air column with tip approximately 5.4 cm above the carina. Enteric tube tip projects below the left hemidiaphragm. Right jugular approach PA catheter with tip coiled over the expected location of the main pulmonary artery outflow tract. There is partial atelectasis of the right lower lobe. No pleural effusion. No definite evidence of edema. Unchanged bones.  IMPRESSION: 1. Right jugular approach PA catheter tip appears coiled over the main pulmonary artery. Repositioning is advised. 2. Otherwise, appropriately positioned support apparatus as above. No supine evidence of pneumothorax. 3. Suspected at least partial atelectasis of the right middle lobe. Otherwise, no acute cardiopulmonary disease. Specifically, no evidence of edema.   Electronically Signed   By: Sandi Mariscal M.D.   On: 01/14/2014 17:22   Dg Abd Portable 1v  01/14/2014    CLINICAL DATA:  Incorrect instrument count.  EXAM: PORTABLE ABDOMEN - 1 VIEW  COMPARISON:  May 15, 2005.  FINDINGS: Nasogastric tube tip seen in proximal stomach. Surgical clips seen in left paraspinal region. No abnormal bowel gas pattern is noted. No abnormal calcifications are noted. No evidence of retained surgical instrument is identified in the included field of view.  IMPRESSION: No evidence of retained instrument or other radiopaque foreign body.   Electronically Signed   By: Sabino Dick M.D.   On: 01/14/2014 15:49      ASSESSMENT / PLAN:  PULMONARY A: Acute Post OP Respiratory Failure with reintubation due to lethargy. Ass Possible RML collapse +  in setting of post operative period.     - s/p extubation 24h earlier on 01/15/14. Doinng well with Chest PT. RML atx improved  P:   Pulmonary hygiene; chest PT; to continue - ? Dc 01/17/14 Incentive spiro  CARDIOVASCULAR A:  S/p AAA repair and s/p 1 unit prbc perop  P:  Management per Vascular Surgery SBP goal less than 140, PRN labetalol, hydralazine Check CBC, May need Blood if hgb < 7gm% or hemodynamic instability   RENAL A:   CKD III baseline creat 2   - worsening creat - Acute on Chronic Kidney Injury, with new onset lactic acidosis (mild) post op  P:   Recheck bmet and lactate 01/16/14 Check BMP Hydrate but reduce rate  GASTROINTESTINAL A:   GERD  - still with NG tube to LIS but no vomitting P:   PPI Let VVS decide timing of feeds  HEMATOLOGIC A:   Likely acute blood loss anemia; sp 1 unit prbc in OR VTE ppx per surgery   - post op cbc looks ok  P:  PRBC for hgb < 7gm%  INFECTIOUS A:    RML infiltrate/collapse post op. Mild elevation in PCT  And new fever 01/17/15 with WC 16.8K P:   Add unasyn for aspn pna Monitor WBC and fever curve.  ENDOCRINE A:   No acute issues  P:   Check glucose on Bmet  NEUROLOGIC A:   Acute encephalopathy in setting of post operative period.  Bipolar  disorder Schizoaffective disorder Depression Parkinson's Disease Tardive Dyskinesia  But on baseline haldol   - when awake he has significant tardive dyskinesia. Says despite this he is on haldol at home  P:   Continue home valproic acid, haldol (he is on  haldol despite tardive dyskinesia) Hold fluoxetine until taking PO Monitor No morphine for pain in setting of renal failure Dilaudid prn for pain   GLOBAL 01/16/14: No family at bedside. PCCM will continue to follow due to ongoing medical issues  Dr. Brand Males, M.D., [facility].C.P Pulmonary and Critical Care Medicine Staff Physician [place] [facility] Pager: [phone], If no answer or between  15:00h - 7:00h: call [phone]  01/16/2014 9:27 AM

## 2014-01-16 NOTE — Progress Notes (Signed)
   VASCULAR PROGRESS NOTE  SUBJECTIVE: No specific complaints. No BM. No flatus. No nausea.  PHYSICAL EXAM: Filed Vitals:   01/16/14 0355 01/16/14 0400 01/16/14 0500 01/16/14 0600  BP: 142/67 136/76 130/106 144/79  Pulse: 107 106 110 103  Temp:  99.7 F (37.6 C) 99.3 F (37.4 C) 99.3 F (37.4 C)  TempSrc:  Core (Comment) Core (Comment) Core (Comment)  Resp: 28 26 28 21   Height:      Weight:      SpO2: 98% 99% 98% 100%   Lungs clear Abdomen: Dressing dry, no bowel sounds yet. Palpable femoral and dorsalis pedis pulses bilaterally. No lower extremity swelling.  LABS: Lab Results  Component Value Date   WBC 16.8* 01/16/2014   HGB 9.6* 01/16/2014   HCT 28.6* 01/16/2014   MCV 98.6 01/16/2014   PLT 102* 01/16/2014   Lab Results  Component Value Date   CREATININE 2.60* 01/15/2014   ASSESSMENT AND PLAN:  * 2 Days Post-Op s/p: open repair of abdominal aortic aneurysm  *  Cardiac: hemodynamically stable. DC'd Swan-Ganz catheter. D/C A-line.   * Pulmonary status stable  * good urine output. Will order a follow up creatinine for the morning.  * leukocytosis with no fever. Follow up CBC tomorrow. No signs of infection.  * GI/nutrition: As the NG tube was difficult to insert, will leave this in until he is at least passing flatus or has good bowel sounds.  * transfer to step down.  Gae Gallop Beeper: [phone] 01/16/2014

## 2014-01-16 NOTE — Progress Notes (Signed)
Patient having more issues with this Blood Pressure being elevated and unable to tolerate CPT at this time. Will attempt again at 0400.

## 2014-01-16 NOTE — Progress Notes (Signed)
DONE DURIONG NEB BUT NOT VERY TOLERANT OF IT. PRESSURE INCREASED TO 150/70 AND hR INCREASED TO 120. STOPPED AT END OF NEB. NO DIFFERENCE PRE OR POST CPT

## 2014-01-17 DIAGNOSIS — F17200 Nicotine dependence, unspecified, uncomplicated: Secondary | ICD-10-CM

## 2014-01-17 LAB — BASIC METABOLIC PANEL
BUN: 38 mg/dL — AB (ref 6–23)
BUN: 41 mg/dL — ABNORMAL HIGH (ref 6–23)
CALCIUM: 9.3 mg/dL (ref 8.4–10.5)
CO2: 25 meq/L (ref 19–32)
CO2: 21 mEq/L (ref 19–32)
CREATININE: 2.88 mg/dL — AB (ref 0.50–1.35)
Calcium: 9.7 mg/dL (ref 8.4–10.5)
Chloride: 129 mEq/L — ABNORMAL HIGH (ref 96–112)
Chloride: 118 mEq/L — ABNORMAL HIGH (ref 96–112)
Creatinine, Ser: 3.04 mg/dL — ABNORMAL HIGH (ref 0.50–1.35)
GFR calc Af Amer: 27 mL/min — ABNORMAL LOW (ref >90)
GFR calc Af Amer: 25 mL/min — ABNORMAL LOW (ref >90)
GFR calc non Af Amer: 22 mL/min — ABNORMAL LOW (ref >90)
GFR, EST NON AFRICAN AMERICAN: 23 mL/min — AB (ref >90)
GLUCOSE: 99 mg/dL (ref 70–99)
Glucose, Bld: 123 mg/dL — ABNORMAL HIGH (ref 70–99)
POTASSIUM: 4.2 meq/L (ref 3.7–5.3)
Potassium: 3.9 mEq/L (ref 3.7–5.3)
SODIUM: 166 meq/L — AB (ref 137–147)
SODIUM: 156 meq/L — AB (ref 137–147)

## 2014-01-17 LAB — CBC WITH DIFFERENTIAL/PLATELET
Basophils Absolute: 0 10*3/uL (ref 0.0–0.1)
Basophils Relative: 0 % (ref 0–1)
EOS ABS: 0 10*3/uL (ref 0.0–0.7)
EOS PCT: 0 % (ref 0–5)
HEMATOCRIT: 28.9 % — AB (ref 39.0–52.0)
Hemoglobin: 9.5 g/dL — ABNORMAL LOW (ref 13.0–17.0)
LYMPHS ABS: 0.6 10*3/uL — AB (ref 0.7–4.0)
LYMPHS PCT: 4 % — AB (ref 12–46)
MCH: 32.3 pg (ref 26.0–34.0)
MCHC: 32.9 g/dL (ref 30.0–36.0)
MCV: 98.3 fL (ref 78.0–100.0)
MONO ABS: 1.4 10*3/uL — AB (ref 0.1–1.0)
Monocytes Relative: 9 % (ref 3–12)
Neutro Abs: 13.3 10*3/uL — ABNORMAL HIGH (ref 1.7–7.7)
Neutrophils Relative %: 87 % — ABNORMAL HIGH (ref 43–77)
Platelets: 142 10*3/uL — ABNORMAL LOW (ref 150–400)
RBC: 2.94 MIL/uL — AB (ref 4.22–5.81)
RDW: 15.3 % (ref 11.5–15.5)
WBC: 15.3 10*3/uL — ABNORMAL HIGH (ref 4.0–10.5)

## 2014-01-17 LAB — LACTIC ACID, PLASMA: Lactic Acid, Venous: 0.9 mmol/L (ref 0.5–2.2)

## 2014-01-17 LAB — MAGNESIUM: Magnesium: 2.6 mg/dL — ABNORMAL HIGH (ref 1.5–2.5)

## 2014-01-17 LAB — PHOSPHORUS: Phosphorus: 3.5 mg/dL (ref 2.3–4.6)

## 2014-01-17 MED ORDER — ENOXAPARIN SODIUM 30 MG/0.3ML ~~LOC~~ SOLN
30.0000 mg | SUBCUTANEOUS | Status: DC
  Administered 2014-01-17 – 2014-01-22 (×6): 30 mg via SUBCUTANEOUS
  Filled 2014-01-17 (×6): qty 0.3

## 2014-01-17 MED ORDER — SODIUM CHLORIDE 0.45 % IV SOLN
INTRAVENOUS | Status: DC
  Administered 2014-01-17: 75 mL/h via INTRAVENOUS
  Administered 2014-01-17: 05:00:00 via INTRAVENOUS

## 2014-01-17 MED ORDER — IPRATROPIUM-ALBUTEROL 0.5-2.5 (3) MG/3ML IN SOLN
3.0000 mL | Freq: Four times a day (QID) | RESPIRATORY_TRACT | Status: DC
  Administered 2014-01-17 – 2014-01-18 (×8): 3 mL via RESPIRATORY_TRACT
  Filled 2014-01-17 (×8): qty 3

## 2014-01-17 NOTE — Progress Notes (Signed)
Pt. Wasn't waken up for 04:00 CPT. Pt. Stated that he wanted to get some sleep & that staff had been in & out of his room all night.

## 2014-01-17 NOTE — Progress Notes (Signed)
ANTIBIOTIC CONSULT NOTE - INITIAL  Pharmacy Consult for lovenox Indication: DVT px  Allergies  Allergen Reactions  . Sulfonamide Derivatives     REACTION: Reaction not known   Patient Measurements: Height: 5' 8.5" (174 cm) Weight: 169 lb 8.5 oz (76.9 kg) IBW/kg (Calculated) : 69.56  Vital Signs: Temp: 97.9 F (36.6 C) (06/07 0258) Temp src: Oral (06/07 0258) BP: 124/81 mmHg (06/07 0258) Pulse Rate: 107 (06/07 0258) Intake/Output from previous day: 06/06 0701 - 06/07 0700 In: 345 [I.V.:220; NG/GT:20; IV Piggyback:105] Out: 2275 [Urine:2075; Emesis/NG output:200] Intake/Output from this shift:    Labs:  Recent Labs  01/15/14 0415 01/16/14 0348 01/16/14 0945 01/17/14 0302  WBC 13.8* 16.8*  --  15.3*  HGB 11.3* 9.6*  --  9.5*  PLT 156 102*  --  142*  CREATININE 2.60*  --  2.67* 2.88*   Estimated Creatinine Clearance: 28.5 ml/min (by C-G formula based on Cr of 2.88). No results found for this basename: VANCOTROUGH, Corlis Leak, VANCORANDOM, GENTTROUGH, GENTPEAK, GENTRANDOM, TOBRATROUGH, TOBRAPEAK, TOBRARND, AMIKACINPEAK, AMIKACINTROU, AMIKACIN,  in the last 72 hours   Microbiology: Recent Results (from the past 720 hour(s))  SURGICAL PCR SCREEN     Status: None   Collection Time    01/07/14 12:49 PM      Result Value Ref Range Status   MRSA, PCR NEGATIVE  NEGATIVE Final   Staphylococcus aureus NEGATIVE  NEGATIVE Final   Comment:            The Xpert SA Assay (FDA     approved for NASAL specimens     in patients over 56 years of age),     is one component of     a comprehensive surveillance     program.  Test performance has     been validated by [employer] for patients greater     than or equal to 25 year old.     It is not intended     to diagnose infection nor to     guide or monitor treatment.    Medical History: Past Medical History  Diagnosis Date  . Impaired glucose tolerance 08/27/2011  . BIPOLAR DISORDER UNSPECIFIED 05/25/2009  . COPD  08/22/2010  . CORONARY ARTERY DISEASE 10/20/2009  . HYPERLIPIDEMIA 05/20/2007  . PARKINSON'S DISEASE 05/25/2009  . 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 TARDIVE DYSKINESIA 05/20/2007  . DEPRESSION 08/18/2008  . GERD 12/01/2008  . HYPERTHYROIDISM 08/18/2008  . Unspecified chronic bronchitis 10/09/2012  . Peripheral vascular disease   . Barrett's esophagus 10/26/13    Dx EGD 10/2013  . Personal history of colonic polyps-adenomas/ssp 10/26/13  . Diverticulosis 10/26/13    mild  . Shortness of breath   . Hypertension     Pt reports that Dr. Cathlean Cower placed him on BP meds temporarily but no longer takes.  Marland Kitchen PONV (postoperative nausea and vomiting)     Pt reports nausea only after cardiac cath  . Anxiety   . CKD (chronic kidney disease), stage III   . Cancer     "precancer; my colon and esophagus" (01/15/2014)  . AAA (abdominal aortic aneurysm)   . History of blood transfusion 01/14/2014    related to AAA repair/notes 01/15/2014    Medications:  Scheduled:  . ampicillin-sulbactam (UNASYN) IV  1.5 g Intravenous Q8H  . antiseptic oral rinse  15 mL Mouth Rinse QID  . chlorhexidine  15 mL Mouth Rinse BID  . ipratropium-albuterol  3 mL Nebulization Q6H  .  metoprolol  2.5 mg Intravenous 4 times per day  . nicotine  21 mg Transdermal Daily  . pantoprazole (PROTONIX) IV  40 mg Intravenous Q24H  . valproate sodium  500 mg Intravenous Q12H   Infusions:  . sodium chloride 100 mL/hr at 01/17/14 0600   Assessment: 58 yo M originally presented for elective abdominal aortic aneurysm repair. Unasyn has been stopped now for PNA. Lovenox will be started for DVT px. He has CKD III so dose will likely not be changed  Plan:   Lovenox 30mg  SQ q24 Rx will sign off

## 2014-01-17 NOTE — Progress Notes (Signed)
PULMONARY / CRITICAL CARE MEDICINE   Name: Ruben Blackwell MRN: [id number] DOB: 1956-07-04    ADMISSION DATE:  01/14/2014 CONSULTATION DATE:  01/14/2014  REFERRING MD :  Trula Slade PRIMARY SERVICE: Vascular Surgery  CHIEF COMPLAINT:  On vent s/p AAA repair  BRIEF PATIENT DESCRIPTION: 58 year old male with multiple medical problemsHTN, CKD III, Hyperthyroid, tardive dyskinesia, and complex psych history  presented 6/4 for elective AAA 6.4cm repair.Or couse complicated by bleeding and s/p 1 unit prbc per-operatively.  Remained on the ventilator post-operatively (reintubated, Per anesthesia: " Patient with upper airway obstruction and suboptimal tidal volumes. Reintubated"  PCCM asked to consult for vent/ICU management.   has a past medical history of Impaired glucose tolerance (08/27/2011); BIPOLAR DISORDER UNSPECIFIED (05/25/2009); COPD (08/22/2010); CORONARY ARTERY DISEASE (10/20/2009); HYPERLIPIDEMIA (05/20/2007); PARKINSON'S DISEASE (05/25/2009); SCHIZOAFFECTIVE DISORDER (05/20/2007); TARDIVE DYSKINESIA (05/20/2007); DEPRESSION (08/18/2008); GERD (12/01/2008); HYPERTHYROIDISM (08/18/2008); Unspecified chronic bronchitis (10/09/2012); Peripheral vascular disease; Barrett's esophagus (10/26/13); Personal history of colonic polyps-adenomas/ssp (10/26/13); Diverticulosis (10/26/13); Shortness of breath; Hypertension; PONV (postoperative nausea and vomiting); Anxiety; CKD (chronic kidney disease), stage III; Cancer; AAA (abdominal aortic aneurysm); and History of blood transfusion (01/14/2014).   has past surgical history that includes Pilonidal cyst / sinus excision; Esophagogastroduodenoscopy; Colonoscopy w/ biopsies; Abdominal aortic aneurysm repair (N/A, 01/14/2014); Hernia repair; Cardiac catheterization (06-16-2009); and Esophageal dilation.    SIGNIFICANT EVENTS / STUDIES:  5/14 - CT Abd > Bilobed infrarenal aortic aneurysm, 6.4 cm maximum diameter, without iliac involvement. Colonic diverticulosis. 6/4 - Elective  AAA repair  LINES / TUBES: OETT 6/4 >>>6/4, 6/4 >>01/16/14 Foley 6/4 >>> NGT 6/4 >>> RIJ PA cath 6/4 >>> Art line 6/4 >>>  CULTURES: n/a  ANTIBIOTICS: periop cefuroxime   SUBJECTIVE:  6/7- Denies significant pain with cough. Says he "was thinking about smoking a little bit more, then trying to quit". I discussed quitting NOW and discussed support. Notes abd bloating crowds his breathing.  VITAL SIGNS: Temp:  [97.6 F (36.4 C)-99.8 F (37.7 C)] 98.7 F (37.1 C) (06/07 0749) Pulse Rate:  [103-120] 107 (06/07 0258) Resp:  [21-26] 26 (06/07 0258) BP: (124-150)/(74-92) 124/81 mmHg (06/07 0258) SpO2:  [91 %-98 %] 98 % (06/07 0749) HEMODYNAMICS:   VENTILATOR SETTINGS:   INTAKE / OUTPUT: Intake/Output     06/06 0701 - 06/07 0700 06/07 0701 - 06/08 0700   I.V. (mL/kg) 220 (2.9)    NG/GT 20    IV Piggyback 105    Total Intake(mL/kg) 345 (4.5)    Urine (mL/kg/hr) 2325 (1.3)    Emesis/NG output 200 (0.1)    Total Output 2525     Net -2180          Urine Occurrence 1 x      PHYSICAL EXAMINATION: General: Alert, cooperative Neuro:  Awake, alert, follows commands but when awake he has significant tardive dyskinesia/ tremor HEENT:  [place]/AT, no JVD noted Cardiovascular: RRR, positive pedal pulses b/l Lungs:  Clear anterior. Shallow, unlabored Abdomen:  +BS, pain at surgical site.  Musculoskeletal:  No acute deformity. Jerking movements of all 4 extremities reportedly due to tardive dyskinesia.  Skin:  Intact anteriorly  LABS:  PULMONARY  Recent Labs Lab 01/14/14 1443 01/15/14 0403 01/15/14 1008  PHART 7.337* 7.294* 7.321*  PCO2ART 42.4 39.9 41.5  PO2ART 225.0* 115.0* 97.0  HCO3 22.7 19.2* 21.3  TCO2 24 20 22   O2SAT 100.0 98.0 97.0    CBC  Recent Labs Lab 01/15/14 0415 01/16/14 0348 01/17/14 0302  HGB 11.3* 9.6* 9.5*  HCT 33.0* 28.6*  28.9*  WBC 13.8* 16.8* 15.3*  PLT 156 102* 142*    COAGULATION  Recent Labs Lab 01/14/14 1840  INR 1.08     CARDIAC    Recent Labs Lab 01/14/14 2005 01/15/14 0500 01/15/14 1146  TROPONINI <0.30 <0.30 <0.30    Recent Labs Lab 01/15/14 0942  PROBNP 136.7*     CHEMISTRY  Recent Labs Lab 01/14/14 1443 01/14/14 1840 01/15/14 0415 01/16/14 0348 01/16/14 0945 01/17/14 0302  NA 142 139 141  --  153* 166*  K 4.0 5.5* 5.3  --  4.0 3.9  CL  --  108 107  --  119* 129*  CO2  --  22 20  --  23 25  GLUCOSE  --  154* 143*  --  103* 99  BUN  --  23 27*  --  31* 38*  CREATININE  --  2.11* 2.60*  --  2.67* 2.88*  CALCIUM  --  7.4* 8.1*  --  8.9 9.7  MG  --  1.6 2.1 2.3  --  2.6*  PHOS  --   --  4.3 3.6  --  3.5   Estimated Creatinine Clearance: 28.5 ml/min (by C-G formula based on Cr of 2.88).   LIVER  Recent Labs Lab 01/14/14 1840 01/15/14 0415  AST  --  20  ALT  --  10  ALKPHOS  --  39  BILITOT  --  0.2*  PROT  --  5.1*  ALBUMIN  --  2.5*  INR 1.08  --      INFECTIOUS  Recent Labs Lab 01/15/14 0942 01/15/14 1146 01/16/14 0348 01/16/14 0945 01/17/14 0302  LATICACIDVEN 2.3*  --   --  0.8 0.9  PROCALCITON  --  1.39 1.75  --   --      ENDOCRINE CBG (last 3)  No results found for this basename: GLUCAP,  in the last 72 hours    IMAGING x48h  Dg Chest Port 1 View  01/16/2014   CLINICAL DATA:  Check ETT  EXAM: PORTABLE CHEST - 1 VIEW  COMPARISON:  01/15/2014  FINDINGS: Mild patchy bilateral lower lobe opacities, likely atelectasis. No pleural effusion or pneumothorax.  The heart is normal in size.  Interval extubation. Stable right IJ venous catheter terminating in the main pulmonary artery. Enteric tube courses below the diaphragm.  IMPRESSION: Interval extubation.  Additional support apparatus as above.  Mild patchy bilateral lower lobe opacities, likely atelectasis.   Electronically Signed   By: Julian Hy M.D.   On: 01/16/2014 08:19    ASSESSMENT / PLAN:  PULMONARY A: Acute Post OP Respiratory Failure with reintubation due to lethargy. Ass  Possible RML collapse +  in setting of post operative period.     - s/p extubation 24h earlier on 01/15/14. Doing well with Chest PT. RML atx improved  P:   Pulmonary hygiene; chest PT; to continue - ? Dc 01/17/14 Incentive spiro  CARDIOVASCULAR A:  S/p AAA repair and s/p 1 unit prbc perop  P:  Management per Vascular Surgery SBP goal less than 140, PRN labetalol, hydralazine Watch CBC, May need Blood if hgb < 7gm% or hemodynamic instability   RENAL A:   CKD III baseline creat 2 - hypernatremic 166 on 6/7. Drinking water- should resolve.  - worsening creat - Acute on Chronic Kidney Injury, with new onset lactic acidosis (mild) post op.   P:   Recheck bmet  Check BMP Hydrate but reduce rate  GASTROINTESTINAL A:  GERD  P:   PPI Let VVS decide timing of feeds  HEMATOLOGIC A:   Likely acute blood loss anemia; sp 1 unit prbc in OR VTE ppx per surgery   - post op cbc looks ok  P:  PRBC for hgb < 7gm%  INFECTIOUS A:    RML infiltrate/collapse post op. Mild elevation in PCT  And new fever 01/17/15 with WC 16.8K CXR  Improving aeration P:   Unasyn dc'd Monitor WBC and fever curve.  ENDOCRINE A:   No acute issues  P:   Check glucose on Bmet  NEUROLOGIC A:   Acute encephalopathy in setting of post operative period.  Bipolar disorder Schizoaffective disorder Depression Parkinson's Disease Tardive Dyskinesia  But on baseline haldol   - when awake he has significant tardive dyskinesia. Says despite this he is on haldol at home  P:   Continue home valproic acid, haldol (he is on haldol despite tardive dyskinesia) Hold fluoxetine until taking PO Monitor No morphine for pain in setting of renal failure Dilaudid prn for pain   GLOBAL 01/16/14: No family at bedside. PCCM will continue to follow due to ongoing medical issues  CD Annamaria Boots, MD Pulmonary and [facility] Pager: [phone], If no answer or between   15:00h - 7:00h: call [phone]  01/17/2014 9:52 AM

## 2014-01-17 NOTE — Progress Notes (Signed)
   VASCULAR PROGRESS NOTE  SUBJECTIVE: Passed flatus. Hungry. No nausea. No BM.  PHYSICAL EXAM: Filed Vitals:   01/16/14 2048 01/16/14 2349 01/17/14 0230 01/17/14 0258  BP:  150/91  124/81  Pulse:  105  107  Temp:  97.6 F (36.4 C)  97.9 F (36.6 C)  TempSrc:  Oral  Oral  Resp:  22  26  Height:      Weight:      SpO2: 93% 92% 95% 91%   Abdomen: Incision fine except umbilicus a little dusky. Good bowel sounds. Palpable DP pulses Lungs clear. No rales.  LABS: Lab Results  Component Value Date   WBC 15.3* 01/17/2014   HGB 9.5* 01/17/2014   HCT 28.9* 01/17/2014   MCV 98.3 01/17/2014   PLT 142* 01/17/2014   Lab Results  Component Value Date   CREATININE 2.88* 01/17/2014   Lab Results  Component Value Date   INR 1.08 01/14/2014   CBG (last 3)  No results found for this basename: GLUCAP,  in the last 72 hours  Active Problems:   Acute respiratory failure with hypoxia   Acute on chronic renal failure   Lactic acidosis   ASSESSMENT AND PLAN:  * 3 Days Post-Op s/p: Repair of AAA.  *  Leukocytosis improved  * Hypernatremia- not sure why, but he was only getting 10cc/hr yesterday. Still had good U/O. Will gently hydrate and F/U Na this afternoon. Do not want to correct this too rapidly.   * Renal- crt up a little. Gently hydrate.  * Ambulate  * IS  * Add Lovenox for DVT prophylaxis.  * Was on Unasyn for pneumonia. Afeb, CXR yest looks good except for ATx, and WBC coming down. I'll stop the Unasyn.    Gae Gallop Beeper: [phone] 01/17/2014

## 2014-01-17 NOTE — Progress Notes (Signed)
Vascular Called this AM with Na of 166. Crt has increased slightly to 2.88.  Will change IV fluid, gently hydrate and repeat this afternoon.  Deitra Mayo, MD, [place] [phone] 01/17/2014

## 2014-01-17 NOTE — Progress Notes (Signed)
CRITICAL VALUE ALERT  Critical value received:  Sodium 166  Date of notification:  01/17/14  Time of notification:  1287  Critical value read back:yes  Nurse who received alert:  Morene Crocker  MD notified (1st page):  Dr. Scot Dock  Time of first page:  0430  MD notified (2nd page):  Time of second page:  Responding MD:  Dr. Scot Dock  Time MD responded:  [phone]  No new orders given

## 2014-01-18 DIAGNOSIS — E870 Hyperosmolality and hypernatremia: Secondary | ICD-10-CM

## 2014-01-18 LAB — TYPE AND SCREEN
ABO/RH(D): O POS
ANTIBODY SCREEN: NEGATIVE
UNIT DIVISION: 0
Unit division: 0

## 2014-01-18 LAB — CBC WITH DIFFERENTIAL/PLATELET
BASOS PCT: 0 % (ref 0–1)
Basophils Absolute: 0 10*3/uL (ref 0.0–0.1)
EOS ABS: 0 10*3/uL (ref 0.0–0.7)
EOS PCT: 0 % (ref 0–5)
HEMATOCRIT: 29.2 % — AB (ref 39.0–52.0)
Hemoglobin: 9.5 g/dL — ABNORMAL LOW (ref 13.0–17.0)
Lymphocytes Relative: 7 % — ABNORMAL LOW (ref 12–46)
Lymphs Abs: 0.9 10*3/uL (ref 0.7–4.0)
MCH: 32 pg (ref 26.0–34.0)
MCHC: 32.5 g/dL (ref 30.0–36.0)
MCV: 98.3 fL (ref 78.0–100.0)
MONO ABS: 1 10*3/uL (ref 0.1–1.0)
Monocytes Relative: 8 % (ref 3–12)
NEUTROS PCT: 85 % — AB (ref 43–77)
Neutro Abs: 10.8 10*3/uL — ABNORMAL HIGH (ref 1.7–7.7)
Platelets: 179 10*3/uL (ref 150–400)
RBC: 2.97 MIL/uL — AB (ref 4.22–5.81)
RDW: 15.5 % (ref 11.5–15.5)
WBC: 12.7 10*3/uL — ABNORMAL HIGH (ref 4.0–10.5)

## 2014-01-18 LAB — BASIC METABOLIC PANEL
BUN: 42 mg/dL — ABNORMAL HIGH (ref 6–23)
CHLORIDE: 118 meq/L — AB (ref 96–112)
CO2: 26 mEq/L (ref 19–32)
Calcium: 9.3 mg/dL (ref 8.4–10.5)
Creatinine, Ser: 3.17 mg/dL — ABNORMAL HIGH (ref 0.50–1.35)
GFR calc non Af Amer: 21 mL/min — ABNORMAL LOW (ref >90)
GFR, EST AFRICAN AMERICAN: 24 mL/min — AB (ref >90)
Glucose, Bld: 115 mg/dL — ABNORMAL HIGH (ref 70–99)
POTASSIUM: 3.9 meq/L (ref 3.7–5.3)
Sodium: 156 mEq/L — ABNORMAL HIGH (ref 137–147)

## 2014-01-18 LAB — PHOSPHORUS: Phosphorus: 5.3 mg/dL — ABNORMAL HIGH (ref 2.3–4.6)

## 2014-01-18 LAB — MAGNESIUM: MAGNESIUM: 2.6 mg/dL — AB (ref 1.5–2.5)

## 2014-01-18 MED ORDER — IPRATROPIUM-ALBUTEROL 0.5-2.5 (3) MG/3ML IN SOLN
3.0000 mL | Freq: Three times a day (TID) | RESPIRATORY_TRACT | Status: DC
  Administered 2014-01-19 – 2014-01-22 (×11): 3 mL via RESPIRATORY_TRACT
  Filled 2014-01-18 (×11): qty 3

## 2014-01-18 MED ORDER — DEXTROSE 5 % IV SOLN
INTRAVENOUS | Status: DC
  Administered 2014-01-18: 13:00:00 via INTRAVENOUS

## 2014-01-18 NOTE — Progress Notes (Signed)
Physical Therapy Treatment Patient Details Name: Ruben Blackwell MRN: [id number] DOB: 1956/05/16 Today's Date: 01/18/2014    History of Present Illness Pt. is a 58 year old male admitted for elective open AAA repair.  Pt. wiith h/o HTN, CKD III, hyperthyroidism, tardive dyskenesia, and complex psych history.  Pt. was extubated 01/15/14.    PT Comments    Patient tolerated some ambulation during session today in addition to self care and transfers from various surfaces. Patient with increased fatigue with activity. Will continue to progress as tolerated.  Follow Up Recommendations  CIR     Equipment Recommendations  Rolling walker with 5" wheels    Recommendations for Other Services       Precautions / Restrictions Precautions Precautions: Fall Restrictions Weight Bearing Restrictions: No    Mobility  Bed Mobility Overal bed mobility: Needs Assistance Bed Mobility: Supine to Sit;Sit to Supine     Supine to sit: Min assist Sit to supine: Min assist   General bed mobility comments: assist for LE and positioning  Transfers Overall transfer level: Needs assistance Equipment used: Rolling walker (2 wheeled) Transfers: Sit to/from Stand Sit to Stand: Min assist         General transfer comment: pt performed sit>stand with min A for power up from bed., mod assist from toilet with grab bar  Ambulation/Gait Ambulation/Gait assistance: Mod assist Ambulation Distance (Feet): 70 Feet Assistive device: Rolling walker (2 wheeled) Gait Pattern/deviations: Ataxic;Decreased stride length;Narrow base of support Gait velocity: decreased Gait velocity interpretation: <1.8 ft/sec, indicative of risk for recurrent falls General Gait Details: Manual pacing required, VCs for increased stride and cadence, significantly ataxic with mobility.   Stairs            Wheelchair Mobility    Modified Rankin (Stroke Patients Only)       Balance Overall balance assessment: Needs  assistance Sitting-balance support: Feet supported Sitting balance-Leahy Scale: Fair     Standing balance support: Single extremity supported Standing balance-Leahy Scale: Poor                      Cognition Arousal/Alertness: Awake/alert Behavior During Therapy: WFL for tasks assessed/performed Overall Cognitive Status: No family/caregiver present to determine baseline cognitive functioning                      Exercises      General Comments        Pertinent Vitals/Pain Reports no pain at this time, VSS, SpO2 89% on room air but non-uniform wave form.    Home Living Family/patient expects to be discharged to:: Inpatient rehab Living Arrangements: Alone Available Help at Discharge: Family;Available 24 hours/day;Other (Comment) (sister only available to help until 01/30/14) Type of Home: House Home Access: Stairs to enter Entrance Stairs-Rails: None Home Layout: One level Home Equipment: None Additional Comments: has a walk-in shower but needs to be retiled before it can be used    Prior Function Level of Independence: Independent      Comments: goes out to eat most of time, drives, and has decreased his self-care activities.   PT Goals (current goals can now be found in the care plan section) Acute Rehab PT Goals Patient Stated Goal: to get home to his cat PT Goal Formulation: With patient Time For Goal Achievement: 01/30/14 Potential to Achieve Goals: Fair Progress towards PT goals: Progressing toward goals    Frequency  Min 3X/week    PT Plan Current plan remains appropriate  Co-evaluation             End of Session Equipment Utilized During Treatment: Gait belt;Other (comment) (worn high on chest) Activity Tolerance: Patient tolerated treatment well Patient left: in chair;with call bell/phone within reach;with nursing/sitter in room     Time: 1232-1258 PT Time Calculation (min): 26 min  Charges:  $Gait Training: 8-22  mins $Therapeutic Activity: 8-22 mins                    G Codes:      Duncan Dull 02-Feb-2014, 1:13 PM Alben Deeds, [place] DPT  [phone]

## 2014-01-18 NOTE — Progress Notes (Signed)
Rehab Admissions Coordinator Note:  Patient was screened by Quentin Mulling Koralyn Prestage for appropriateness for an Inpatient Acute Rehab Consult.  At this time, we are recommending Inpatient Rehab consult.  Miklo Aken L Lora Glomski, PT 01/18/2014, 8:44 AM  I can be reached at [phone].

## 2014-01-18 NOTE — Progress Notes (Signed)
  [facility] AAA Progress Note  01/18/2014 7:28 AM 4 Days Post-Op  Subjective:  Patient with no complaints this am. Denies pain. Said he vomited last night. Has been passing flatus. No BM.  Denies chest pain, shortness of breath, productive cough.   Tmax 98.7 BP sys 120s-140s 100% 02 [place]   Filed Vitals:   01/18/14 0400  BP: 130/87  Pulse: 91  Temp: 98.2 F (36.8 C)  Resp: 22    Physical Exam: Cardiac:  RRR  Lungs:  Clear to auscultation anterior and lateral  Abdomen:  +BS, midline incision c/d/i, minimal tenderness to palpation Extremities:  Palpable DP pulses b/l  CBC    Component Value Date/Time   WBC 12.7* 01/18/2014 0245   RBC 2.97* 01/18/2014 0245   HGB 9.5* 01/18/2014 0245   HCT 29.2* 01/18/2014 0245   PLT 179 01/18/2014 0245   MCV 98.3 01/18/2014 0245   MCH 32.0 01/18/2014 0245   MCHC 32.5 01/18/2014 0245   RDW 15.5 01/18/2014 0245   LYMPHSABS 0.9 01/18/2014 0245   MONOABS 1.0 01/18/2014 0245   EOSABS 0.0 01/18/2014 0245   BASOSABS 0.0 01/18/2014 0245    BMET    Component Value Date/Time   NA 156* 01/18/2014 0245   K 3.9 01/18/2014 0245   CL 118* 01/18/2014 0245   CO2 26 01/18/2014 0245   GLUCOSE 115* 01/18/2014 0245   BUN 42* 01/18/2014 0245   CREATININE 3.17* 01/18/2014 0245   CREATININE 1.87* 12/19/2013 1159   CALCIUM 9.3 01/18/2014 0245   GFRNONAA 21* 01/18/2014 0245   GFRAA 24* 01/18/2014 0245    INR    Component Value Date/Time   INR 1.08 01/14/2014 1840     Intake/Output Summary (Last 24 hours) at 01/18/14 0728 Last data filed at 01/18/14 0400  Gross per 24 hour  Intake    600 ml  Output   2150 ml  Net  -1550 ml     Assessment/Plan:  58 y.o. male is s/p  Repair of AAA 4 Days Post-Op  -Hypernatremia: Na decreasing today to 156. Continue gentle hydration. Will recheck labs in am.  -Leukocytosis: trending down to 12.7 today. Afebrile. Unasyn d/c'ed. Will continue to monitor WBC and fever. Lactic acidosis improving.  -Acute on chronic kidney injury: SCr  continuing to rise to 3.17. Pt still taking in clear liquids with  good urine output. Will monitor. -Acute surgical blood loss anemia: Hgb stable 9.5, Plts 179.  -PT for OOB -Continue clear liquids today.  -Continue IS -DVT prophylaxis: Lovenox.    Virgina Jock, PA-C [facility] Office: [phone] Pager: [phone] 01/18/2014 7:28 AM  I agree with the above.  The patient continues to improve.  He is passing flatus.  His incisions have healed nicely.  I will keep you 1 a clear liquid diet as he did have some emesis this morning.  His hypernatremia is improving.  Annamarie Major

## 2014-01-18 NOTE — Progress Notes (Signed)
Lethargic. NAD  Filed Vitals:   01/18/14 0400 01/18/14 0733 01/18/14 0741 01/18/14 1101  BP: 130/87 122/62  88/56  Pulse: 91 83  83  Temp: 98.2 F (36.8 C) 97.8 F (36.6 C)  98.6 F (37 C)  TempSrc: Oral Axillary  Oral  Resp: 22 32  14  Height:      Weight:      SpO2: 97% 97% 97% 95%    SpO2 90% on RA  NAD No JVD Diminished BS, minimal distant end exp wheezes RRR s M Abd soft Ext warm without edema  I have reviewed all of today's lab results. Relevant abnormalities are discussed in the A/P section  Na 156 Cr 3.17   IMPRESSION: Severe COPD Post op VDRF, resolved Acute on CRI, nonoliguric Severe hypernatremia  PLAN: Cont nebulized BDs Cont supp O2 as needed Replete free water - IVFs changed to D5W. Would continue this until Na < 145 Monitor chem panels  PCCM will sign off. Please call if we can be of further assistance  Merton Border, MD ; [facility] [phone].  After 5:30 PM or weekends, call [phone]

## 2014-01-18 NOTE — Evaluation (Signed)
I have read and agree with this note.   Time in/out:9:06-9:40 Total time:34 minutes Valarie Merino, [place])  [place], OTR/L [phone]

## 2014-01-18 NOTE — Progress Notes (Signed)
Utilization review completed.  

## 2014-01-18 NOTE — Evaluation (Signed)
Occupational Therapy Evaluation Patient Details Name: Ruben Blackwell MRN: [id number] DOB: November 09, 1955 Today's Date: 01/18/2014    History of Present Illness Pt. is a 58 year old male admitted for elective open AAA repair.  Pt. wiith h/o HTN, CKD III, hyperthyroidism, tardive dyskenesia, and complex psych history.  Pt. was extubated 01/15/14.   Clinical Impression   Pt presents with general weakness from above limiting his ability to perform his ADLs independently. Pt was independent prior to admission and now requires assistance for transfers and ADLs. Pt was educated in transfers and the importance of sitting in the chair. Pt will benefit from continued OT to increase his strength to get to a modified independent level. We will continue to follow.    Follow Up Recommendations  CIR    Equipment Recommendations  3 in 1 bedside comode       Precautions / Restrictions Precautions Precautions: Fall Restrictions Weight Bearing Restrictions: No      Mobility Bed Mobility Overal bed mobility: Needs Assistance Bed Mobility: Supine to Sit     Supine to sit: Min guard        Transfers Overall transfer level: Needs assistance Equipment used: 1 person hand held assist Transfers: Sit to/from Stand Sit to Stand: Min assist         General transfer comment: pt performed sit>stand with min A for power up from bed. pt ambulated to toilet with one hand held assist for safety due to tartive dyskinesia tremors and decreased balance.    Balance Overall balance assessment: Needs assistance Sitting-balance support: Feet supported Sitting balance-Leahy Scale: Fair     Standing balance support: Single extremity supported Standing balance-Leahy Scale: Poor                              ADL Overall ADL's : Needs assistance/impaired Eating/Feeding: Sitting;Set up   Grooming: Set up;Sitting   Upper Body Bathing: Sitting;Minimal assitance   Lower Body Bathing: Sit to/from  stand;Minimal assistance   Upper Body Dressing : Minimal assistance;Sitting   Lower Body Dressing: Minimal assistance;Sit to/from stand   Toilet Transfer: Minimal assistance;Ambulation Toilet Transfer Details (indicate cue type and reason): with one hand assist for ambulation to/from toilet;  Toileting- Clothing Manipulation and Hygiene: Sit to/from stand;Min guard       Functional mobility during ADLs: Minimal assistance General ADL Comments: Pts cathetar had come out and his bed was soaked. pt able to ambulate to/from the toilet with one hand assist for safety and help with tartive dsykinesia tremor and perform toilet hygiene with min guard. Pt was constantly wanting water to drink during treatment               Pertinent Vitals/Pain No c/o pain during tx.     Hand Dominance Right   Extremity/Trunk Assessment Upper Extremity Assessment Upper Extremity Assessment: RUE deficits/detail;LUE deficits/detail RUE Deficits / Details: tremor during activity LUE Deficits / Details: tremors during activity   Lower Extremity Assessment Lower Extremity Assessment: Defer to PT evaluation          Cognition Arousal/Alertness: Awake/alert Behavior During Therapy: WFL for tasks assessed/performed Overall Cognitive Status: No family/caregiver present to determine baseline cognitive functioning                                Home Living Family/patient expects to be discharged to:: Inpatient rehab Living Arrangements: Alone Available  Help at Discharge: Family;Available 24 hours/day;Other (Comment) (sister only available to help until 01/30/14) Type of Home: House Home Access: Stairs to enter [employer] of Steps: 4 Entrance Stairs-Rails: None Home Layout: One level     Bathroom Shower/Tub: Tub/shower unit Shower/tub characteristics: Door       Home Equipment: None   Additional Comments: has a walk-in shower but needs to be retiled before it can be  used      Prior Functioning/Environment Level of Independence: Independent        Comments: goes out to eat most of time, drives, and has decreased his self-care activities.    OT Diagnosis: Generalized weakness;Acute pain   OT Problem List: Decreased strength;Decreased coordination;Decreased activity tolerance;Impaired balance (sitting and/or standing);Impaired UE functional use;Decreased knowledge of use of DME or AE   OT Treatment/Interventions: Self-care/ADL training;Patient/family education;Balance training;DME and/or AE instruction    OT Goals(Current goals can be found in the care plan section) Acute Rehab OT Goals Patient Stated Goal: to get home to his cat OT Goal Formulation: With patient Time For Goal Achievement: 01/25/14 Potential to Achieve Goals: Good ADL Goals Pt Will Perform Grooming: with modified independence;sitting Pt Will Perform Lower Body Bathing: with modified independence;sit to/from stand Pt Will Perform Lower Body Dressing: with modified independence;sit to/from stand Pt Will Transfer to Toilet: with modified independence;bedside commode (over toilet ) Pt Will Perform Toileting - Clothing Manipulation and hygiene: with modified independence;sit to/from stand Pt Will Perform Tub/Shower Transfer: 3 in 1;ambulating;Tub transfer;with modified independence  OT Frequency: Min 2X/week    End of Session Equipment Utilized During Treatment: Gait belt Nurse Communication: Mobility status  Activity Tolerance:   Patient left: in chair;with call bell/phone within reach;with chair alarm set   Time: [phone] OT Time Calculation (min): 34 min Charges:  OT General Charges $OT Visit: 1 Procedure OT Evaluation $Initial OT Evaluation Tier I: 1 Procedure OT Treatments $Self Care/Home Management : 23-37 mins G-Codes:    Lyda Perone 01/19/2014, 10:57 AM

## 2014-01-19 DIAGNOSIS — R5381 Other malaise: Secondary | ICD-10-CM

## 2014-01-19 LAB — BASIC METABOLIC PANEL
BUN: 41 mg/dL — ABNORMAL HIGH (ref 6–23)
CO2: 23 meq/L (ref 19–32)
Calcium: 8.4 mg/dL (ref 8.4–10.5)
Chloride: 104 mEq/L (ref 96–112)
Creatinine, Ser: 2.87 mg/dL — ABNORMAL HIGH (ref 0.50–1.35)
GFR calc Af Amer: 27 mL/min — ABNORMAL LOW (ref >90)
GFR, EST NON AFRICAN AMERICAN: 23 mL/min — AB (ref >90)
Glucose, Bld: 104 mg/dL — ABNORMAL HIGH (ref 70–99)
Potassium: 3.5 mEq/L — ABNORMAL LOW (ref 3.7–5.3)
Sodium: 143 mEq/L (ref 137–147)

## 2014-01-19 LAB — CBC
HEMATOCRIT: 26.9 % — AB (ref 39.0–52.0)
HEMOGLOBIN: 9 g/dL — AB (ref 13.0–17.0)
MCH: 32.5 pg (ref 26.0–34.0)
MCHC: 33.5 g/dL (ref 30.0–36.0)
MCV: 97.1 fL (ref 78.0–100.0)
Platelets: 199 10*3/uL (ref 150–400)
RBC: 2.77 MIL/uL — AB (ref 4.22–5.81)
RDW: 14.7 % (ref 11.5–15.5)
WBC: 8.9 10*3/uL (ref 4.0–10.5)

## 2014-01-19 LAB — CLOSTRIDIUM DIFFICILE BY PCR: Toxigenic C. Difficile by PCR: NEGATIVE

## 2014-01-19 LAB — OCCULT BLOOD X 1 CARD TO LAB, STOOL: Fecal Occult Bld: NEGATIVE

## 2014-01-19 MED ORDER — SIMVASTATIN 40 MG PO TABS
40.0000 mg | ORAL_TABLET | Freq: Every day | ORAL | Status: DC
  Administered 2014-01-19 – 2014-01-21 (×3): 40 mg via ORAL
  Filled 2014-01-19 (×4): qty 1

## 2014-01-19 MED ORDER — FLUOXETINE HCL 20 MG PO CAPS
20.0000 mg | ORAL_CAPSULE | Freq: Every day | ORAL | Status: DC
  Administered 2014-01-19 – 2014-01-21 (×3): 20 mg via ORAL
  Filled 2014-01-19 (×4): qty 1

## 2014-01-19 MED ORDER — DIVALPROEX SODIUM 500 MG PO DR TAB
1000.0000 mg | DELAYED_RELEASE_TABLET | Freq: Every day | ORAL | Status: DC
  Administered 2014-01-19 – 2014-01-21 (×3): 1000 mg via ORAL
  Filled 2014-01-19 (×4): qty 2

## 2014-01-19 MED ORDER — DEXTROSE 5 % IV SOLN
INTRAVENOUS | Status: DC
  Administered 2014-01-19: 50 mL via INTRAVENOUS
  Administered 2014-01-20: via INTRAVENOUS

## 2014-01-19 MED ORDER — PANTOPRAZOLE SODIUM 40 MG PO TBEC
40.0000 mg | DELAYED_RELEASE_TABLET | Freq: Every day | ORAL | Status: DC
  Administered 2014-01-19 – 2014-01-22 (×4): 40 mg via ORAL
  Filled 2014-01-19 (×4): qty 1

## 2014-01-19 MED ORDER — HALOPERIDOL 2 MG PO TABS
2.0000 mg | ORAL_TABLET | Freq: Every day | ORAL | Status: DC
  Administered 2014-01-19 – 2014-01-21 (×3): 2 mg via ORAL
  Filled 2014-01-19 (×4): qty 1

## 2014-01-19 MED ORDER — BOOST / RESOURCE BREEZE PO LIQD
1.0000 | Freq: Three times a day (TID) | ORAL | Status: DC
  Administered 2014-01-19 – 2014-01-22 (×9): 1 via ORAL

## 2014-01-19 NOTE — Progress Notes (Signed)
Inpatient Rehabilitation  I met with the patient at the bedside to discuss his post acute rehab options and the details of IP rehab.  He is agreeable to IP rehab but would like me to speak with  his sister.  I have left 2 voicemails for her and await her return call.  I have placed a call to Silva Bandy,  PA for Dr. Trula Slade to determine if pt. Is medically ready today.  Await call back. Will update as soon as I hear back .  Have discussed with pt's RN Morghan.  Please call if questions.  [place] Admissions Coordinator Cell [phone] Office [phone]

## 2014-01-19 NOTE — Plan of Care (Signed)
Problem: Phase II Progression Outcomes Goal: Date transferred to telemetry Outcome: Completed/Met Date Met:  01/19/14 6/8  Comments:  6/8

## 2014-01-19 NOTE — Progress Notes (Addendum)
[facility]   Subjective  - Doing well.  Some loose stools yesterday, still have foley catheter.  He states he has some incontinence prior to surgery.   Objective 103/65 79 97.7 F (36.5 C) (Oral) 22 96%  Intake/Output Summary (Last 24 hours) at 01/19/14 0729 Last data filed at 01/19/14 0456  Gross per 24 hour  Intake 3275.42 ml  Output   2250 ml  Net 1025.42 ml    Abdomin soft with pos BS. Incision is healing well without erythema. Palpable DP pulses bil. LE Heart RRR  Assessment/Planning: POD #5 Open AAA repair WBC WNL 8.9 today NA+ WNL Cr decreased from 3.17 to 2.87 HGB drop from 9.5 to 9.0.  No active bleeding noted. Tolerating clears well. DVT prophylaxis: Lovenox Keep on clear liquids today secondary to vomiting yesterday and transfer to 2W. D/C foley apply condom cath instead for incontinence. C diff pending     Ulyses Amor 01/19/2014 7:29 AM --  Laboratory Lab Results:  Recent Labs  01/18/14 0245 01/19/14 0300  WBC 12.7* 8.9  HGB 9.5* 9.0*  HCT 29.2* 26.9*  PLT 179 199   BMET  Recent Labs  01/18/14 0245 01/19/14 0300  NA 156* 143  K 3.9 3.5*  CL 118* 104  CO2 26 23  GLUCOSE 115* 104*  BUN 42* 41*  CREATININE 3.17* 2.87*  CALCIUM 9.3 8.4    COAG Lab Results  Component Value Date   INR 1.08 01/14/2014   INR 0.86 01/07/2014   INR 0.9 ratio 06/13/2009   No results found for this basename: PTT      I agree with the above Acute on chronic renal failure:  Cr decreased today, still with good UOP, continue to monitor.  D/c foley today GI:  Being treated for Nausea and diarrhea pre-op.  will start protonix today.  Heme check stools.   CDiff negative Acute blood loss anemia:  Stable Hypernatremia:  Na back to normal levels, will decrease IV fluids Transfer to floor   [place] Sajad Glander

## 2014-01-19 NOTE — Progress Notes (Signed)
NUTRITION FOLLOW UP  Intervention:    Resource Breeze po TID, each supplement provides 250 kcal and 9 grams of protein  Nutrition Dx:   Inadequate oral intake related to altered GI function as evidenced by clear liquid diet. Ongoing.  Goal:   Intake to meet >90% of estimated nutrition needs. Unmet.  Monitor:   Diet advancement, PO intake, labs, weight trend.  Assessment:   Patient with PMH of Parkinson's disease, schizoaffective disorder, smoking on home oxygen; presented for elective AAA repair; OR course complicated by bleeding and s/p 1 unit prbc.  Patient s/p procedure 6/4:  OPEN REPAIR OF INFRARENAL ABDOMINAL AORTIC ANEURYSM  PRIMARY REPAIR OR UMBILICAL HERNIA  Patient was intubated from 6/4 to 6/5. Currently receiving a clear liquid diet. Some nausea noted.  Height: Ht Readings from Last 1 Encounters:  01/14/14 5' 8.5" (1.74 m)    Weight Status:   Wt Readings from Last 1 Encounters:  01/15/14 169 lb 8.5 oz (76.9 kg)    Re-estimated needs:  Kcal: 1950-2150 Protein: 100-115 gm Fluid: >/= 2 L  Skin: stage 1 pressure ulcer to coccyx  Diet Order: Clear Liquid   Intake/Output Summary (Last 24 hours) at 01/19/14 1442 Last data filed at 01/19/14 1104  Gross per 24 hour  Intake   1360 ml  Output   2550 ml  Net  -1190 ml    Last BM: 6/9 (diarrhea)   Labs:   Recent Labs Lab 01/16/14 0348  01/17/14 0302 01/17/14 2110 01/18/14 0245 01/19/14 0300  NA  --   < > 166* 156* 156* 143  K  --   < > 3.9 4.2 3.9 3.5*  CL  --   < > 129* 118* 118* 104  CO2  --   < > 25 21 26 23   BUN  --   < > 38* 41* 42* 41*  CREATININE  --   < > 2.88* 3.04* 3.17* 2.87*  CALCIUM  --   < > 9.7 9.3 9.3 8.4  MG 2.3  --  2.6*  --  2.6*  --   PHOS 3.6  --  3.5  --  5.3*  --   GLUCOSE  --   < > 99 123* 115* 104*  < > = values in this interval not displayed.  CBG (last 3)  No results found for this basename: GLUCAP,  in the last 72 hours  Scheduled Meds: . antiseptic oral rinse   15 mL Mouth Rinse QID  . chlorhexidine  15 mL Mouth Rinse BID  . enoxaparin (LOVENOX) injection  30 mg Subcutaneous Q24H  . ipratropium-albuterol  3 mL Nebulization TID  . metoprolol  2.5 mg Intravenous 4 times per day  . nicotine  21 mg Transdermal Daily  . pantoprazole  40 mg Oral Daily  . valproate sodium  500 mg Intravenous Q12H    Continuous Infusions: . dextrose 50 mL/hr at 01/19/14 [place], RD, LDN, Page Pager [phone] After Hours Pager [phone]

## 2014-01-19 NOTE — Progress Notes (Addendum)
Physical Therapy Treatment Patient Details Name: Ruben Blackwell MRN: [id number] DOB: 1956-07-17 Today's Date: 01/19/2014    History of Present Illness Pt. is a 58 year old male admitted for elective open AAA repair.  Pt. wiith h/o HTN, CKD III, hyperthyroidism, tardive dyskenesia, and complex psych history.  Pt. was extubated 01/15/14.    PT Comments    Patient performed some ambulation but limited overall by 2 episodes of incontinence. Patient assisted with dynamic balance during pericare. Will continue to see and progress as tolerated.   Follow Up Recommendations  CIR     Equipment Recommendations  Rolling walker with 5" wheels    Recommendations for Other Services       Precautions / Restrictions Precautions Precautions: Fall Restrictions Weight Bearing Restrictions: No    Mobility  Bed Mobility Overal bed mobility: Needs Assistance Bed Mobility: Rolling;Supine to Sit Rolling: Min assist   Supine to sit: Min assist     General bed mobility comments: VCs for initiation, assist for LE and positioning  Transfers Overall transfer level: Needs assistance Equipment used: 1 person hand held assist Transfers: Sit to/from Stand Sit to Stand: Min assist         General transfer comment: pt performed sit>stand with min A for power up from bed., mod assist from toilet with grab bar  Ambulation/Gait Ambulation/Gait assistance: Mod assist Ambulation Distance (Feet): 60 Feet (additional 20 ft in room (limited by incontinence)) Assistive device: 1 person hand held assist Gait Pattern/deviations: Ataxic;Decreased stride length;Narrow base of support Gait velocity: decreased Gait velocity interpretation: <1.8 ft/sec, indicative of risk for recurrent falls General Gait Details: Manual pacing required, VCs for increased stride and cadence, significantly ataxic with mobility.   Stairs            Wheelchair Mobility    Modified Rankin (Stroke Patients Only)       Balance     Sitting balance-Leahy Scale: Fair       Standing balance-Leahy Scale: Poor (assisted needed for pericare and dynamic standing)                      Cognition Arousal/Alertness: Awake/alert Behavior During Therapy: WFL for tasks assessed/performed Overall Cognitive Status: No family/caregiver present to determine baseline cognitive functioning                      Exercises      General Comments General comments (skin integrity, edema, etc.): assisted patient with pericare and hygiene       Pertinent Vitals/Pain VSS, pt reports no pain at this time    Home Living                      Prior Function            PT Goals (current goals can now be found in the care plan section) Acute Rehab PT Goals Patient Stated Goal: to get home to his cat PT Goal Formulation: With patient Time For Goal Achievement: 01/30/14 Potential to Achieve Goals: Fair Progress towards PT goals: Progressing toward goals    Frequency  Min 3X/week    PT Plan Current plan remains appropriate    Co-evaluation             End of Session Equipment Utilized During Treatment: Gait belt;Other (comment) (worn high on chest) Activity Tolerance: Patient tolerated treatment well Patient left: in chair;with call bell/phone within reach;with chair alarm set  Time: 2023-3435 PT Time Calculation (min): 24 min  Charges:  $Therapeutic Activity: 8-22 mins $Self Care/Home Management: 16-Apr-2023                    G Codes:      Duncan Dull Jan 31, 2014, 8:37 AM Alben Deeds, PT DPT  [phone]

## 2014-01-19 NOTE — Progress Notes (Addendum)
1147 Attempted to call report to charge RN, unavailable at this time. Awaiting return call. Continue to monitor patient. 1210 CIR admissions to see patient, awaiting to give 2W report until CIR plan established.  Dr. Trula Slade to see patient, not medically cleared. Will call report to 2W.  1325 Report called to Maudie Mercury, RN 2W. All belongings sent with patient. VSS. eICU and CMT notified of transfer to telemetry.  1410 Pt transferred by bed by NT and charge RN due to diarrhea.

## 2014-01-19 NOTE — Progress Notes (Signed)
Inpatient Rehabilitation  I spoke with pt.'s sister Francetta Found over the phone.  She is pleased and agreeable that pt. Is being considered for IP rehab.  I note that pt. Is to remain on clear liquid diet today due to vomiting yesterday.  Will check back tomorrow for readiness.  Please call if questions.  [place] Admissions Coordinator Cell [phone] Office [phone]

## 2014-01-19 NOTE — Consult Note (Signed)
Physical Medicine and Rehabilitation Consult Reason for Consult: Deconditioning after AAA repair Referring Physician: Dr. Trula Slade   HPI: Ruben Blackwell is a 58 y.o. right-handed male multi-medical with history of hypertension, chronic renal insufficiency with baseline creatinine 2.11, part of dyskinesia as well as Parkinson's disease and schizoaffective disorder. Admitted 01/14/2014 with a known abdominal aortic aneurysm of 4.7 cm increased to 6.3 cm aneurysm within 6 months. He elected surgical intervention and underwent AAA repair as well as primary repair of umbilical hernia 23/53/6144 per Dr. Trula Slade. Postoperative respiratory failure requiring intubation followed by critical care pulmonary services. Hospital course acute blood loss anemia 9.0 and monitored. Bouts of hyponatremia 166 and IV fluids changed to D5W with latest sodium 143. Close monitoring of chronic renal insufficiency with latest creatinine 2.87. Maintained on subcutaneous Lovenox for DVT prophylaxis. Bouts of loose stool Clostridium difficile pending currently on contact cautions. Physical and occupational therapy evaluations completed ongoing with recommendations of physical medicine rehabilitation consult   Review of Systems  Gastrointestinal: Positive for constipation.       GERD  Musculoskeletal: Positive for myalgias.  Neurological:       Tremor  Psychiatric/Behavioral: Positive for depression.       Bipolar disorder   Past Medical History  Diagnosis Date  . Impaired glucose tolerance 08/27/2011  . BIPOLAR DISORDER UNSPECIFIED 05/25/2009  . COPD 08/22/2010  . CORONARY ARTERY DISEASE 10/20/2009  . HYPERLIPIDEMIA 05/20/2007  . PARKINSON'S DISEASE 05/25/2009  . 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 TARDIVE DYSKINESIA 05/20/2007  . DEPRESSION 08/18/2008  . GERD 12/01/2008  . HYPERTHYROIDISM 08/18/2008  . Unspecified chronic bronchitis 10/09/2012  . Peripheral vascular disease   . Barrett's esophagus 10/26/13   Dx EGD 10/2013  . Personal history of colonic polyps-adenomas/ssp 10/26/13  . Diverticulosis 10/26/13    mild  . Shortness of breath   . Hypertension     Pt reports that Dr. Cathlean Cower placed him on BP meds temporarily but no longer takes.  Marland Kitchen PONV (postoperative nausea and vomiting)     Pt reports nausea only after cardiac cath  . Anxiety   . CKD (chronic kidney disease), stage III   . Cancer     "precancer; my colon and esophagus" (01/15/2014)  . AAA (abdominal aortic aneurysm)   . History of blood transfusion 01/14/2014    related to AAA repair/notes 01/15/2014   Past Surgical History  Procedure Laterality Date  . Pilonidal cyst / sinus excision    . Esophagogastroduodenoscopy    . Colonoscopy w/ biopsies    . Abdominal aortic aneurysm repair N/A 01/14/2014    Procedure: ABDOMINAL AORTIC ANEURYSM WITH BI ILIAC REPAIR; PRIMARY REPAIR OF UMBILICAL HERNIA;  Surgeon: Serafina Mitchell, MD;  Location: [facility];  Service: Vascular;  Laterality: N/A;  . Hernia repair      "belly"  . Cardiac catheterization  06-16-2009    Dr. Johnsie Cancel  . Esophageal dilation      "once"   Family History  Problem Relation Age of Onset  . Hyperlipidemia Mother   . Hypertension Mother   . Varicose Veins Mother   . Diabetes Father   . Heart disease Father     before age 70  . Heart attack Father   . Cancer Sister   . Hyperlipidemia Sister   . Hypertension Sister   . Cancer Brother   . Diabetes Brother   . Hyperlipidemia Brother   . Hypertension Brother    Social History:  reports that  he has been smoking Cigarettes.  He has a 100 pack-year smoking history. He has never used smokeless tobacco. He reports that he drinks alcohol. He reports that he does not use illicit drugs. Allergies:  Allergies  Allergen Reactions  . Sulfonamide Derivatives     REACTION: Reaction not known   Medications Prior to Admission  Medication Sig Dispense Refill  . divalproex (DEPAKOTE) 500 MG DR tablet Take 1,000 mg by mouth at  bedtime.      Marland Kitchen FLUoxetine (PROZAC) 20 MG capsule Take 20 mg by mouth at bedtime.      . haloperidol (HALDOL) 2 MG tablet Take 2 mg by mouth at bedtime.      . mometasone-formoterol (DULERA) 100-5 MCG/ACT AERO Inhale 2 puffs into the lungs 2 (two) times daily.      . nicotine (NICODERM CQ - DOSED IN MG/24 HOURS) 21 mg/24hr patch Place 21 mg onto the skin daily.      Marland Kitchen omeprazole (PRILOSEC) 40 MG capsule Take 1 capsule (40 mg total) by mouth daily before supper.  30 capsule  11  . simvastatin (ZOCOR) 40 MG tablet Take 40 mg by mouth at bedtime.      Marland Kitchen tiotropium (SPIRIVA) 18 MCG inhalation capsule Place 18 mcg into inhaler and inhale daily.        Home: Home Living Family/patient expects to be discharged to:: Inpatient rehab Living Arrangements: Alone Available Help at Discharge: Family;Available 24 hours/day;Other (Comment) (sister only available to help until 01/30/14) Type of Home: House Home Access: Stairs to enter [employer] of Steps: 4 Entrance Stairs-Rails: None Home Layout: One level Home Equipment: None Additional Comments: has a walk-in shower but needs to be retiled before it can be used  Functional History: Prior Function Level of Independence: Independent Comments: goes out to eat most of time, drives, and has decreased his self-care activities. Functional Status:  Mobility: Bed Mobility Overal bed mobility: Needs Assistance Bed Mobility: Supine to Sit;Sit to Supine Supine to sit: Min assist Sit to supine: Min assist General bed mobility comments: assist for LE and positioning Transfers Overall transfer level: Needs assistance Equipment used: Rolling walker (2 wheeled) Transfers: Sit to/from Stand Sit to Stand: Min assist Stand pivot transfers: +2 physical assistance;Mod assist General transfer comment: pt performed sit>stand with min A for power up from bed., mod assist from toilet with grab bar Ambulation/Gait Ambulation/Gait assistance: Mod  assist Ambulation Distance (Feet): 70 Feet Assistive device: Rolling walker (2 wheeled) Gait Pattern/deviations: Ataxic;Decreased stride length;Narrow base of support Gait velocity: decreased Gait velocity interpretation: <1.8 ft/sec, indicative of risk for recurrent falls General Gait Details: Manual pacing required, VCs for increased stride and cadence, significantly ataxic with mobility.    ADL: ADL Overall ADL's : Needs assistance/impaired Eating/Feeding: Sitting;Set up Grooming: Set up;Sitting Upper Body Bathing: Sitting;Minimal assitance Lower Body Bathing: Sit to/from stand;Minimal assistance Upper Body Dressing : Minimal assistance;Sitting Lower Body Dressing: Minimal assistance;Sit to/from stand Toilet Transfer: Minimal assistance;Ambulation Toilet Transfer Details (indicate cue type and reason): with one hand assist for ambulation to/from toilet;  Toileting- Clothing Manipulation and Hygiene: Sit to/from stand;Min guard Functional mobility during ADLs: Minimal assistance General ADL Comments: Pts cathetar had come off and his bed was soaked. pt able to ambulate to/from the toilet with one hand assist for safety due to tartive dsykinesia tremor and perform toilet hygiene with min guard. Pt was constantly wanting water to drink during treatment. Pt reports he had only been eating 1 meal a day and had not  been taking care of himself hygiene wise the way he should.  Cognition: Cognition Overall Cognitive Status: No family/caregiver present to determine baseline cognitive functioning Orientation Level: Oriented X4 Cognition Arousal/Alertness: Awake/alert Behavior During Therapy: WFL for tasks assessed/performed Overall Cognitive Status: No family/caregiver present to determine baseline cognitive functioning  Blood pressure 105/59, pulse 92, temperature 98.4 F (36.9 C), temperature source Oral, resp. rate 27, height 5' 8.5" (1.74 m), weight 76.9 kg (169 lb 8.5 oz), SpO2  97.00%. Physical Exam  Constitutional: He appears well-developed.  HENT:  Head: Normocephalic.  Eyes: EOM are normal.  Neck: Normal range of motion. Neck supple. No thyromegaly present.  Cardiovascular: Normal rate and regular rhythm.   Respiratory: Effort normal and breath sounds normal. No respiratory distress.  GI: Soft. Bowel sounds are normal. He exhibits no distension.  Neurological: He is alert.  Tremors all 4 limbs, low frequency. Patient provided his name, age, date of birth and  place. Follows simple commands. UE grossly 4/5 prox to distally. LE 3/5 HF, KE and 4/5 distally. Sensory function intact. Decreased [place]. A bit distracted. Needed redirection to stay on task  Skin:  Surgical site clean and dry  Psychiatric:  A bit flat, disconnected    Results for orders placed during the hospital encounter of 01/14/14 (from the past 24 hour(s))  CBC     Status: Abnormal   Collection Time    01/19/14  3:00 AM      Result Value Ref Range   WBC 8.9  4.0 - 10.5 K/uL   RBC 2.77 (*) 4.22 - 5.81 MIL/uL   Hemoglobin 9.0 (*) 13.0 - 17.0 g/dL   HCT 26.9 (*) 39.0 - 52.0 %   MCV 97.1  78.0 - 100.0 fL   MCH 32.5  26.0 - 34.0 pg   MCHC 33.5  30.0 - 36.0 g/dL   RDW 14.7  11.5 - 15.5 %   Platelets 199  150 - 400 K/uL  BASIC METABOLIC PANEL     Status: Abnormal   Collection Time    01/19/14  3:00 AM      Result Value Ref Range   Sodium 143  137 - 147 mEq/L   Potassium 3.5 (*) 3.7 - 5.3 mEq/L   Chloride 104  96 - 112 mEq/L   CO2 23  19 - 32 mEq/L   Glucose, Bld 104 (*) 70 - 99 mg/dL   BUN 41 (*) 6 - 23 mg/dL   Creatinine, Ser 2.87 (*) 0.50 - 1.35 mg/dL   Calcium 8.4  8.4 - 10.5 mg/dL   GFR calc non Af Amer 23 (*) >90 mL/min   GFR calc Af Amer 27 (*) >90 mL/min   No results found.  Assessment/Plan: Diagnosis: deconditioning related after AAA repair,  multiple medical issues 1. Does the need for close, 24 hr/day medical supervision in concert with the patient's rehab needs make it  unreasonable for this patient to be served in a less intensive setting? Yes 2. Co-Morbidities requiring supervision/potential complications: respiratory faliure, depression, parkinon's disease?, bipolar 3. Due to bladder management, bowel management, safety, skin/wound care, disease management, medication administration, pain management and patient education, does the patient require 24 hr/day rehab nursing? Yes 4. Does the patient require coordinated care of a physician, rehab nurse, PT (1-2 hrs/day, 5 days/week) and OT (1-2 hrs/day, 5 days/week) to address physical and functional deficits in the context of the above medical diagnosis(es)? Yes Addressing deficits in the following areas: balance, endurance, locomotion, strength, transferring, bowel/bladder control,  bathing, dressing, feeding, grooming, toileting and psychosocial support 5. Can the patient actively participate in an intensive therapy program of at least 3 hrs of therapy per day at least 5 days per week? Yes 6. The potential for patient to make measurable gains while on inpatient rehab is excellent 7. Anticipated functional outcomes upon discharge from inpatient rehab are modified independent and supervision  with PT, modified independent and supervision with OT, n/a with SLP. 8. Estimated rehab length of stay to reach the above functional goals is: 7-10 days 9. Does the patient have adequate social supports to accommodate these discharge functional goals? Yes and Potentially 10. Anticipated D/C setting: Home 11. Anticipated post D/C treatments: [place] therapy 12. Overall Rehab/Functional Prognosis: excellent  RECOMMENDATIONS: This patient's condition is appropriate for continued rehabilitative care in the following setting: CIR Patient has agreed to participate in recommended program. Yes Note that insurance prior authorization may be required for reimbursement for recommended care.  Comment: Rehab Admissions Coordinator to follow  up.  Thanks,  Meredith Staggers, MD, Mellody Drown     01/19/2014

## 2014-01-20 ENCOUNTER — Inpatient Hospital Stay (HOSPITAL_COMMUNITY): Payer: Medicare Other

## 2014-01-20 LAB — BASIC METABOLIC PANEL WITH GFR
BUN: 38 mg/dL — ABNORMAL HIGH (ref 6–23)
CO2: 22 meq/L (ref 19–32)
Calcium: 8 mg/dL — ABNORMAL LOW (ref 8.4–10.5)
Chloride: 108 meq/L (ref 96–112)
Creatinine, Ser: 2.76 mg/dL — ABNORMAL HIGH (ref 0.50–1.35)
GFR calc Af Amer: 28 mL/min — ABNORMAL LOW
GFR calc non Af Amer: 24 mL/min — ABNORMAL LOW
Glucose, Bld: 111 mg/dL — ABNORMAL HIGH (ref 70–99)
Potassium: 3.3 meq/L — ABNORMAL LOW (ref 3.7–5.3)
Sodium: 143 meq/L (ref 137–147)

## 2014-01-20 LAB — PREPARE RBC (CROSSMATCH)

## 2014-01-20 LAB — CBC
HCT: 21.5 % — ABNORMAL LOW (ref 39.0–52.0)
Hemoglobin: 7.6 g/dL — ABNORMAL LOW (ref 13.0–17.0)
MCH: 33.5 pg (ref 26.0–34.0)
MCHC: 35.3 g/dL (ref 30.0–36.0)
MCV: 94.7 fL (ref 78.0–100.0)
Platelets: 163 K/uL (ref 150–400)
RBC: 2.27 MIL/uL — ABNORMAL LOW (ref 4.22–5.81)
RDW: 14.3 % (ref 11.5–15.5)
WBC: 12.7 K/uL — ABNORMAL HIGH (ref 4.0–10.5)

## 2014-01-20 LAB — OCCULT BLOOD X 1 CARD TO LAB, STOOL: Fecal Occult Bld: NEGATIVE

## 2014-01-20 LAB — URINE MICROSCOPIC-ADD ON

## 2014-01-20 LAB — URINALYSIS, ROUTINE W REFLEX MICROSCOPIC
Bilirubin Urine: NEGATIVE
Glucose, UA: NEGATIVE mg/dL
KETONES UR: NEGATIVE mg/dL
LEUKOCYTES UA: NEGATIVE
Nitrite: NEGATIVE
PROTEIN: NEGATIVE mg/dL
Specific Gravity, Urine: 1.008 (ref 1.005–1.030)
Urobilinogen, UA: 0.2 mg/dL (ref 0.0–1.0)
pH: 6 (ref 5.0–8.0)

## 2014-01-20 MED ORDER — ACETAMINOPHEN 500 MG PO TABS
1000.0000 mg | ORAL_TABLET | Freq: Four times a day (QID) | ORAL | Status: DC | PRN
  Administered 2014-01-20: 1000 mg via ORAL
  Filled 2014-01-20 (×2): qty 2

## 2014-01-20 MED ORDER — LEVOFLOXACIN 750 MG PO TABS
750.0000 mg | ORAL_TABLET | ORAL | Status: DC
  Administered 2014-01-20 – 2014-01-22 (×2): 750 mg via ORAL
  Filled 2014-01-20 (×2): qty 1

## 2014-01-20 MED ORDER — LOPERAMIDE HCL 2 MG PO CAPS
4.0000 mg | ORAL_CAPSULE | Freq: Three times a day (TID) | ORAL | Status: DC | PRN
  Administered 2014-01-20: 4 mg via ORAL
  Filled 2014-01-20: qty 2

## 2014-01-20 NOTE — Care Management Note (Signed)
    Page 1 of 1   01/22/2014     4:42:43 PM CARE MANAGEMENT NOTE 01/22/2014  Patient:  HARJAS, BIGGINS   Account Number:  [id number]  Date Initiated:  01/18/2014  Documentation initiated by:  Marvetta Gibbons  Subjective/Objective Assessment:   Pt admitted with AAA repair, post op resp. failure     Action/Plan:   PTA pt lived at home- PT/OT evals, CIR consulted   Anticipated DC Date:  01/22/2014   Anticipated DC Plan:  IP REHAB FACILITY  In-house referral  Clinical Social Worker      DC Planning Services  CM consult      Choice offered to / List presented to:             Status of service:  Completed, signed off Medicare Important Message given?  YES (If response is "NO", the following Medicare IM given date fields will be blank) Date Medicare IM given:   Date Additional Medicare IM given:  01/22/2014  Discharge Disposition:  IP REHAB FACILITY  Per UR Regulation:  Reviewed for med. necessity/level of care/duration of stay  If discussed at [place] of Stay Meetings, dates discussed:   01/19/2014  01/22/2014    Comments:  01/22/14 Ellan Lambert, [place], [place] Pt discharging to IP rehab today.  01/20/14 Ellan Lambert, RN, BSN [phone] Rehab c/s in progress; CSW following for SNF backup, if needed.  Not medically stable for dc today.  Will follow progress.

## 2014-01-20 NOTE — Progress Notes (Signed)
Inpatient Rehabilitation  I note pt's decreasing Hgb. and  increasing WBCs.,  and discussed with Silva Bandy, PA.  Pt. not medically ready for IP rehab.  Will await medical readiness.  Please call if questions.  [place] Admissions Coordinator Cell [phone] Office [phone]

## 2014-01-20 NOTE — Progress Notes (Signed)
Occupational Therapy Treatment Patient Details Name: HARSHIL CAVALLARO MRN: [id number] DOB: 09-12-1955 Today's Date: 01/20/2014    History of present illness Pt. is a 58 year old male admitted for elective open AAA repair.  Pt. wiith h/o HTN, CKD III, hyperthyroidism, tardive dyskenesia, and complex psych history.  Pt. was extubated 01/15/14.   OT comments  Pt performed ADLs at sink. Continue to recommend CIR for additional rehab prior to d/c home.  Follow Up Recommendations  CIR    Equipment Recommendations  3 in 1 bedside comode    Recommendations for Other Services      Precautions / Restrictions Precautions Precautions: Fall Restrictions Weight Bearing Restrictions: No       Mobility Bed Mobility Overal bed mobility: Needs Assistance Bed Mobility: Sit to Supine;Supine to Sit     Supine to sit: Supervision Sit to supine: Supervision   General bed mobility comments: supervision for safety/lines.  Transfers Overall transfer level: Needs assistance Equipment used: 1 person hand held assist Transfers: Sit to/from Stand Sit to Stand: Min guard         General transfer comment: Min guard for safety.    Balance                                   ADL Overall ADL's : Needs assistance/impaired     Grooming: Wash/dry face;Min guard;Standing       Lower Body Bathing: Min guard;Sit to/from stand           Toilet Transfer: Moderate assistance;Ambulation (hand held assist and pushed IV pole)           Functional mobility during ADLs: Moderate assistance General ADL Comments: Pt performed ADLs at sink. OT applied cream to buttocks on red area-nurse aware of red place.      Vision                     Perception     Praxis      Cognition   Behavior During Therapy: [facility] for tasks assessed/performed Overall Cognitive Status: No family/caregiver present to determine baseline cognitive functioning                        Extremity/Trunk Assessment               Exercises     Shoulder Instructions       General Comments      Pertinent Vitals/ Pain       Pain on buttocks area-cream applied and nurse aware of sore.  Home Living                                          Prior Functioning/Environment              Frequency Min 2X/week     Progress Toward Goals  OT Goals(current goals can now be found in the care plan section)  Progress towards OT goals: Progressing toward goals  Acute Rehab OT Goals Patient Stated Goal: not stated OT Goal Formulation: With patient Time For Goal Achievement: 01/25/14 Potential to Achieve Goals: Good ADL Goals Pt Will Perform Grooming: with modified independence;sitting Pt Will Perform Lower Body Bathing: with modified independence;sit to/from stand Pt Will Perform Lower Body Dressing: with modified independence;sit to/from stand  Pt Will Transfer to Toilet: with modified independence;bedside commode (over toilet) Pt Will Perform Toileting - Clothing Manipulation and hygiene: with modified independence;sit to/from stand Pt Will Perform Tub/Shower Transfer: 3 in 1;ambulating;Tub transfer;with modified independence  Plan Discharge plan remains appropriate    Co-evaluation                 End of Session Equipment Utilized During Treatment: Gait belt   Activity Tolerance Patient tolerated treatment well   Patient Left in bed;with call bell/phone within reach;with bed alarm set   Nurse Communication          Time: 9458-5929 OT Time Calculation (min): 18 min  Charges: OT General Charges $OT Visit: 1 Procedure OT Treatments $Self Care/Home Management : 8-22 mins  Benito Mccreedy OTR/L [phone] 01/20/2014, 4:43 PM

## 2014-01-20 NOTE — Progress Notes (Signed)
CSW received consult for SNF placement. CSW reviewed patient's chart and noticed plan was for CIR. CSW will continue to follow in the event that the plan changes.  Jeanette Caprice, MSW, [place]

## 2014-01-20 NOTE — Progress Notes (Signed)
Fever spiked to 100.5 this afternoon. CXR showed RLL infiltrate. WBC elevated today. Discussed with Dr. Trula Slade and started patient on Levaquin.

## 2014-01-20 NOTE — Progress Notes (Signed)
[facility] Progress Note  01/20/2014 8:05 AM 6 Days Post-Op  Subjective:  Pt doing well this am. Having 1/10 pain with abdomen. No nausea or vomiting. Still having diarrhea  Tmax 99.9 BP sys 90s-120s 02 92% RA  Filed Vitals:   01/20/14 0356  BP: 99/55  Pulse: 84  Temp: 98.7 F (37.1 C)  Resp: 18    Physical Exam: Cardiac:  RRR, no m/g/r  Lungs:  Clear to auscultation anterior  Abdomen:  Midline incision c/d/i. Mild distension, mild tenderness to palpation. + BS Extremities:  Palpable DP pulses b/l   CBC    Component Value Date/Time   WBC 12.7* 01/20/2014 0138   RBC 2.27* 01/20/2014 0138   HGB 7.6* 01/20/2014 0138   HCT 21.5* 01/20/2014 0138   PLT 163 01/20/2014 0138   MCV 94.7 01/20/2014 0138   MCH 33.5 01/20/2014 0138   MCHC 35.3 01/20/2014 0138   RDW 14.3 01/20/2014 0138   LYMPHSABS 0.9 01/18/2014 0245   MONOABS 1.0 01/18/2014 0245   EOSABS 0.0 01/18/2014 0245   BASOSABS 0.0 01/18/2014 0245    BMET    Component Value Date/Time   NA 143 01/20/2014 0138   K 3.3* 01/20/2014 0138   CL 108 01/20/2014 0138   CO2 22 01/20/2014 0138   GLUCOSE 111* 01/20/2014 0138   BUN 38* 01/20/2014 0138   CREATININE 2.76* 01/20/2014 0138   CREATININE 1.87* 12/19/2013 1159   CALCIUM 8.0* 01/20/2014 0138   GFRNONAA 24* 01/20/2014 0138   GFRAA 28* 01/20/2014 0138    INR    Component Value Date/Time   INR 1.08 01/14/2014 1840     Intake/Output Summary (Last 24 hours) at 01/20/14 0805 Last data filed at 01/20/14 0359  Gross per 24 hour  Intake    770 ml  Output   2600 ml  Net  -1830 ml     Assessment/Plan:  58 y.o. male is s/p  Open AAA repair  6 Days Post-Op  -Leukocytosis: rising to 12.7 today. Low grade fever yesterday evening 99.9. Afebrile this am. Will monitor.  -Acute surgical blood loss anemia: Hgb decreasing to 7.6 today from 9.0 yesterday. BP this am 99/55. 103/54 yesterday evening. Will discuss transfusion with Dr. Trula Slade. Will order hemoccult.  -Na  WNL -Acute on chronic renal failure: Cr continuing to decrease with good UOP. Will continue to monitor. Patient notes chronic incontinence. Will keep condom cath.  -No nausea/vomiting in past 24 hours. Will increase diet to full liquids.  -Still having loose stools. C diff negative.  -Continue to ambulate, IS.  -DVT prophylaxis: Lovenox    Addendum  Transfuse 2 units this am.  CXR and UA for increased WBC  Virgina Jock, PA-C [facility] Office: [phone] Pager: [phone] 01/20/2014 8:05 AM   I agree with the above.  The patient has been seen and examined  Acute on chronic renal failure: Creatinine continues to trend down with adequate urine output.  Foley catheter is now out he has a condom catheter on.  Leukocytosis: With history of indwelling Foley catheter, I will check a urinalysis today.  He will also get a chest x-ray given his postoperative x-rays.  He has been off antibiotics.  He does not have a productive cough.  Acute blood loss anemia: The patient's hemoglobin has decreased.  He'll receive 2 units of blood today.  GI: The patient has been followed as an outpatient for nausea and diarrhea.  Hemoccult was negative yesterday.  I'll repeat  this today.  He is now on protonix. by mouth.  He will be advanced to a regular diet.  F./E./N.: Change IV fluids to [facility].  Disposition: Will be based on final results and recommendations of physical therapy and rehabilitation.  Hopefully the patient will be able to be discharged within the next one to 2 days.  Annamarie Major

## 2014-01-21 LAB — BASIC METABOLIC PANEL
BUN: 31 mg/dL — AB (ref 6–23)
CALCIUM: 8.7 mg/dL (ref 8.4–10.5)
CHLORIDE: 105 meq/L (ref 96–112)
CO2: 20 meq/L (ref 19–32)
CREATININE: 2.32 mg/dL — AB (ref 0.50–1.35)
GFR calc Af Amer: 35 mL/min — ABNORMAL LOW (ref >90)
GFR calc non Af Amer: 30 mL/min — ABNORMAL LOW (ref >90)
Glucose, Bld: 96 mg/dL (ref 70–99)
Potassium: 3.3 mEq/L — ABNORMAL LOW (ref 3.7–5.3)
Sodium: 144 mEq/L (ref 137–147)

## 2014-01-21 LAB — CBC
HEMATOCRIT: 34 % — AB (ref 39.0–52.0)
Hemoglobin: 11.7 g/dL — ABNORMAL LOW (ref 13.0–17.0)
MCH: 32.3 pg (ref 26.0–34.0)
MCHC: 34.4 g/dL (ref 30.0–36.0)
MCV: 93.9 fL (ref 78.0–100.0)
Platelets: 212 10*3/uL (ref 150–400)
RBC: 3.62 MIL/uL — AB (ref 4.22–5.81)
RDW: 15.5 % (ref 11.5–15.5)
WBC: 15.9 10*3/uL — ABNORMAL HIGH (ref 4.0–10.5)

## 2014-01-21 LAB — TYPE AND SCREEN
ABO/RH(D): O POS
ANTIBODY SCREEN: NEGATIVE
UNIT DIVISION: 0
Unit division: 0

## 2014-01-21 MED ORDER — METOPROLOL TARTRATE 25 MG PO TABS
25.0000 mg | ORAL_TABLET | Freq: Two times a day (BID) | ORAL | Status: DC
  Administered 2014-01-21 – 2014-01-22 (×3): 25 mg via ORAL
  Filled 2014-01-21 (×4): qty 1

## 2014-01-21 NOTE — Progress Notes (Signed)
Physical Therapy Treatment Patient Details Name: Ruben Blackwell MRN: [id number] DOB: 1955-12-31 Today's Date: 01/21/2014    History of Present Illness Pt. is a 58 year old male admitted for elective open AAA repair.  Pt. wiith h/o HTN, CKD III, hyperthyroidism, tardive dyskenesia, and complex psych history.  Pt. was extubated 01/15/14.    PT Comments    Pt admitted with above. Pt currently with functional limitations due to balance and endurance deficits.  Pt will benefit from skilled PT to increase their independence and safety with mobility to allow discharge to the venue listed below.    Follow Up Recommendations  Supervision/Assistance - 24 hour;CIR     Equipment Recommendations  Rolling walker with 5" wheels    Recommendations for Other Services       Precautions / Restrictions Precautions Precautions: Fall Restrictions Weight Bearing Restrictions: No    Mobility  Bed Mobility Overal bed mobility: Needs Assistance Bed Mobility: Supine to Sit     Supine to sit: Supervision        Transfers Overall transfer level: Needs assistance Equipment used: 1 person hand held assist Transfers: Sit to/from Stand Sit to Stand: Min assist         General transfer comment: min assist for stability  Ambulation/Gait Ambulation/Gait assistance: Min assist;Mod assist Ambulation Distance (Feet): 25 Feet Assistive device: 1 person hand held assist Gait Pattern/deviations: Ataxic;Decreased stride length;Narrow base of support;Antalgic   Gait velocity interpretation: <1.8 ft/sec, indicative of risk for recurrent falls General Gait Details: Manual pacing required, VCs for increased stride and cadence, significantly ataxic with mobility.   Stairs            Wheelchair Mobility    Modified Rankin (Stroke Patients Only)       Balance Overall balance assessment: Needs assistance;History of Falls Sitting-balance support: Bilateral upper extremity supported;Feet  supported Sitting balance-Leahy Scale: Fair     Standing balance support: Single extremity supported;During functional activity Standing balance-Leahy Scale: Poor Standing balance comment: poor balance reactions.  Poor postural stabiility.  Requires UE support for balance.                      Cognition Arousal/Alertness: Awake/alert Behavior During Therapy: WFL for tasks assessed/performed Overall Cognitive Status: No family/caregiver present to determine baseline cognitive functioning                      Exercises General Exercises - Lower Extremity Ankle Circles/Pumps: AROM;Both;10 reps;Seated Long Arc Quad: AROM;Both;10 reps;Seated Hip Flexion/Marching: AROM;Both;10 reps;Seated    General Comments        Pertinent Vitals/Pain VSS, no pain    Home Living                      Prior Function            PT Goals (current goals can now be found in the care plan section) Progress towards PT goals: Progressing toward goals    Frequency  Min 3X/week    PT Plan Current plan remains appropriate    Co-evaluation             End of Session Equipment Utilized During Treatment: Gait belt Activity Tolerance: Patient limited by fatigue Patient left: in chair;with call bell/phone within reach;with chair alarm set     Time: 1220-1245 PT Time Calculation (min): 25 min  Charges:  $Gait Training: 8-22 mins $Therapeutic Exercise: 8-22 mins  G Codes:      INGOLD,Patt Steinhardt 01/21/2014, 1:23 PM [facility] Acute Rehabilitation [phone] (pager)

## 2014-01-21 NOTE — Progress Notes (Addendum)
I await medical readiness to admit pt to inpt rehab . I met with pt and his sister at bedside. I will follow his progress daily. [phone]

## 2014-01-21 NOTE — Progress Notes (Addendum)
[facility] [place]  Subjective  - Very pleasant and doing well.  He feels better over all.   Objective 166/84 101 98.7 F (37.1 C) (Axillary) 18 93%  Intake/Output Summary (Last 24 hours) at 01/21/14 0756 Last data filed at 01/21/14 0700  Gross per 24 hour  Intake   1550 ml  Output   6404 ml  Net  -4854 ml    Abdomin soft, NTTP.  Incision C/D/I Heart RRR Lungs CTA right left with positive wheezing   Assessment/Planning: POD #7 Open AAA WBC elevated 15.9 from 12.7 yesterday.  Persistent patchy infiltrate/ pneumonia in right lower lobe. Left lung is clear.  Will repeat chest x ray tomorrow.  2 units PRBC transfused HGB 11.7 Acute surgical blood loss anemia.   Acute on  CKD Cr 2.32  Acute surgical blood loss anemia       Theda Sers, EMMA [facility] 01/21/2014 7:56 AM --  Laboratory Lab Results:  Recent Labs  01/20/14 0138 01/21/14 0402  WBC 12.7* 15.9*  HGB 7.6* 11.7*  HCT 21.5* 34.0*  PLT 163 212   BMET  Recent Labs  01/20/14 0138 01/21/14 0402  NA 143 144  K 3.3* 3.3*  CL 108 105  CO2 22 20  GLUCOSE 111* 96  BUN 38* 31*  CREATININE 2.76* 2.32*  CALCIUM 8.0* 8.7    COAG Lab Results  Component Value Date   INR 1.08 01/14/2014   INR 0.86 01/07/2014   INR 0.9 ratio 06/13/2009   No results found for this basename: PTT      I agree with the above.  The patient has been seen and examined.  His incision is healing nicely.  Pulmonary: Chest x-ray shows right-sided pneumonia.  His white blood cell count increased today.  He was started on Levaquin yesterday.  I will repeat his chest x-ray in the morning as well as his blood work.  Acute blood loss anemia: The patient responded nicely to 2 units of packed red cells.  Disposition: Over the patient will be a candidate for rehabilitation.  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  Annamarie Major

## 2014-01-21 NOTE — PMR Pre-admission (Signed)
PMR Admission Coordinator Pre-Admission Assessment  Patient: Ruben Blackwell is an 58 y.o., male MRN: [id number] DOB: 1956/03/10 Height: 5' 8.5" (174 cm) Weight: 76.9 kg (169 lb 8.5 oz)              Insurance Information HMO:     PPO:      PCP:      IPA:      80/20: yes     OTHER: no HMO PRIMARY: Medicare a and b      Policy#: [id number]      Subscriber: pt Benefits:  Phone #: [email or web address] online     Name: 01/19/2014 Eff. Date: 01/11/1990     Deduct: $1260      Out of Pocket Max: none      Life Max: none CIR: 100%      SNF: 20 full days Outpatient: 80%     Co-Pay: 20% Home Health: 100%      Co-Pay: none DME: 80%     Co-Pay: 20% Providers: pt choice  SECONDARY: none       Medicaid Application Date:       Case Manager: Pt states he is not eligible for Medicaid due to an inheritance Disability Application Date:       Case Worker: Has been disabled since 1990 due to his schizoeffective disorder per pt and his sister  Emergency Contact Information Contact Information   Name Relation Home Work Mobile   [place] Sister [phone]       Current Medical History  Patient Admitting Diagnosis: deconditioning related after AAA repair, multiple medical issues  History of Present Illness: Ruben Blackwell is a 58 y.o. right-handed male multi-medical with history of hypertension, chronic renal insufficiency with baseline creatinine 2.11, part of dyskinesia and schizoaffective disorder.   Admitted 01/14/2014 with a known abdominal aortic aneurysm of 4.7 cm increased to 6.3 cm aneurysm within 6 months. He elected surgical intervention and underwent AAA repair as well as primary repair of umbilical hernia 74/25/9563 per Dr. Trula Slade. Postoperative respiratory failure requiring intubation followed by critical care pulmonary services. Hospital course acute blood loss anemia 9.0 and monitored. Bouts of hyponatremia 166 and IV fluids changed to D5W with latest sodium 143. Close monitoring of chronic renal  insufficiency with latest creatinine 2.87. Maintained on subcutaneous Lovenox for DVT prophylaxis.   Past Medical History  Past Medical History  Diagnosis Date  . Impaired glucose tolerance 08/27/2011  . BIPOLAR DISORDER UNSPECIFIED 05/25/2009  . COPD 08/22/2010  . CORONARY ARTERY DISEASE 10/20/2009  . HYPERLIPIDEMIA 05/20/2007  . PARKINSON'S DISEASE 05/25/2009  . 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 TARDIVE DYSKINESIA 05/20/2007  . DEPRESSION 08/18/2008  . GERD 12/01/2008  . HYPERTHYROIDISM 08/18/2008  . Unspecified chronic bronchitis 10/09/2012  . Peripheral vascular disease   . Barrett's esophagus 10/26/13    Dx EGD 10/2013  . Personal history of colonic polyps-adenomas/ssp 10/26/13  . Diverticulosis 10/26/13    mild  . Shortness of breath   . Hypertension     Pt reports that Dr. Cathlean Cower placed him on BP meds temporarily but no longer takes.  Marland Kitchen PONV (postoperative nausea and vomiting)     Pt reports nausea only after cardiac cath  . Anxiety   . CKD (chronic kidney disease), stage III   . Cancer     "precancer; my colon and esophagus" (01/15/2014)  . AAA (abdominal aortic aneurysm)   . History of blood transfusion 01/14/2014    related to AAA repair/notes 01/15/2014  Family History  family history includes Cancer in his brother and sister; Diabetes in his brother and father; Heart attack in his father; Heart disease in his father; Hyperlipidemia in his brother, mother, and sister; Hypertension in his brother, mother, and sister; Varicose Veins in his mother.  Prior Rehab/Hospitalizations: none   Current Medications  Current facility-administered medications:acetaminophen (TYLENOL) tablet 1,000 mg, 1,000 mg, Oral, Q6H PRN, Alvia Grove, PA-C, 1,000 mg at 01/20/14 1544;  albuterol (PROVENTIL) (2.5 MG/3ML) 0.083% nebulizer solution 2.5 mg, 2.5 mg, Nebulization, Q3H PRN, Wilhelmina Mcardle, MD;  antiseptic oral rinse (BIOTENE) solution 15 mL, 15 mL, Mouth Rinse, QID, Arloa Koh, RN, 15 mL at 01/21/14 1228 chlorhexidine (PERIDEX) 0.12 % solution 15 mL, 15 mL, Mouth Rinse, BID, Arloa Koh, RN, 15 mL at 01/21/14 0746;  divalproex (DEPAKOTE) DR tablet 1,000 mg, 1,000 mg, Oral, QHS, Emma M Collins, PA-C, 1,000 mg at 01/21/14 2240;  enoxaparin (LOVENOX) injection 30 mg, 30 mg, Subcutaneous, Q24H, Serafina Mitchell, MD, 30 mg at 01/21/14 1105 feeding supplement (RESOURCE BREEZE) (RESOURCE BREEZE) liquid 1 Container, 1 Container, Oral, TID BM, Dalene Carrow, RD, 1 Container at 01/21/14 1342;  FLUoxetine (PROZAC) capsule 20 mg, 20 mg, Oral, QHS, Emma M Collins, PA-C, 20 mg at 01/21/14 2240;  haloperidol (HALDOL) tablet 2 mg, 2 mg, Oral, QHS, Emma M Collins, PA-C, 2 mg at 01/21/14 2240 haloperidol lactate (HALDOL) injection 1 mg, 1 mg, Intravenous, Q6H PRN, Alvia Grove, PA-C, 1 mg at 01/18/14 2230;  hydrALAZINE (APRESOLINE) injection 10 mg, 10 mg, Intravenous, Q2H PRN, Alvia Grove, PA-C, 10 mg at 01/15/14 2148;  ipratropium-albuterol (DUONEB) 0.5-2.5 (3) MG/3ML nebulizer solution 3 mL, 3 mL, Nebulization, TID, Serafina Mitchell, MD, 3 mL at 01/22/14 0731 labetalol (NORMODYNE,TRANDATE) injection 10 mg, 10 mg, Intravenous, Q2H PRN, Alvia Grove, PA-C, 10 mg at 01/16/14 0204;  levofloxacin (LEVAQUIN) tablet 750 mg, 750 mg, Oral, Q48H, Janalyn Harder Trinh, PA-C, 750 mg at 01/20/14 1740;  loperamide (IMODIUM) capsule 4 mg, 4 mg, Oral, Q8H PRN, Alvia Grove, PA-C, 4 mg at 01/20/14 1544;  magnesium sulfate IVPB 2 g 50 mL, 2 g, Intravenous, Daily PRN, Alvia Grove, PA-C metoprolol tartrate (LOPRESSOR) tablet 25 mg, 25 mg, Oral, BID, Emma M Collins, PA-C, 25 mg at 01/21/14 2240;  nicotine (NICODERM CQ - dosed in mg/24 hours) patch 21 mg, 21 mg, Transdermal, Daily, Alvia Grove, PA-C, 21 mg at 01/21/14 1108;  ondansetron (ZOFRAN) injection 4 mg, 4 mg, Intravenous, Q6H PRN, Alvia Grove, PA-C, 4 mg at 01/17/14 2100 pantoprazole (PROTONIX) EC tablet 40  mg, 40 mg, Oral, Daily, Serafina Mitchell, MD, 40 mg at 01/21/14 1105;  phenol (CHLORASEPTIC) mouth spray 1 spray, 1 spray, Mouth/Throat, PRN, Alvia Grove, PA-C;  simvastatin (ZOCOR) tablet 40 mg, 40 mg, Oral, QHS, Emma M Collins, PA-C, 40 mg at 01/21/14 2240  Patients Current Diet: General  Precautions / Restrictions Precautions Precautions: Fall Restrictions Weight Bearing Restrictions: No   Prior Activity Level Community (5-7x/wk): usually ate out daily  Home Assistive Devices / [place] Devices/Equipment: Nebulizer;Eyeglasses;Grab bars in shower Home Equipment: None  Prior Functional Level Prior Function Level of Independence: Independent Comments: goes out to eat most of time, drives, and has decreased his self-care activities.  Current Functional Level Cognition  Overall Cognitive Status: History of cognitive impairments - at baseline Orientation Level: Oriented X4    Extremity Assessment (includes Sensation/Coordination)  ADLs  Overall ADL's : Needs assistance/impaired Eating/Feeding: Sitting;Set up Grooming: Wash/dry face;Min guard;Standing Upper Body Bathing: Sitting;Minimal assitance Lower Body Bathing: Min guard;Sit to/from stand Upper Body Dressing : Minimal assistance;Sitting Lower Body Dressing: Minimal assistance;Sit to/from stand Toilet Transfer: Moderate assistance;Ambulation (hand held assist and pushed IV pole) Toilet Transfer Details (indicate cue type and reason): with one hand assist for ambulation to/from toilet;  Toileting- Clothing Manipulation and Hygiene: Sit to/from stand;Min guard Functional mobility during ADLs: Moderate assistance General ADL Comments: Pt performed ADLs at sink. OT applied cream to buttocks on red area-nurse aware of red place.    Mobility  Overal bed mobility: Needs Assistance Bed Mobility: Supine to Sit Rolling: Min assist Supine to sit: Supervision Sit to supine: Supervision General bed  mobility comments: supervision for safety/lines.    Transfers  Overall transfer level: Needs assistance Equipment used: 1 person hand held assist Transfers: Sit to/from Stand Sit to Stand: Min assist Stand pivot transfers: +2 physical assistance;Mod assist General transfer comment: min assist for stability    Ambulation / Gait / Stairs / Wheelchair Mobility  Ambulation/Gait Ambulation/Gait assistance: Min assist;Mod assist Ambulation Distance (Feet): 25 Feet Assistive device: 1 person hand held assist Gait Pattern/deviations: Ataxic;Decreased stride length;Narrow base of support;Antalgic Gait velocity: decreased Gait velocity interpretation: <1.8 ft/sec, indicative of risk for recurrent falls General Gait Details: Manual pacing required, VCs for increased stride and cadence, significantly ataxic with mobility.    Posture / Balance      Special needs/care consideration Skin surgical wound Bowel mgmt: pt reports diarhea has lessened over past 24 hrs Bladder mgmt: incontinence/ condom catheter Pt does NOT like crowds. We discussed the therapy gym and multiple pts receiving treatment at the same time. He states he does not like to discuss his personal matters in front of a lot of people, he states he will let therapy know if it becomes an issue for him. Pt states his tardive dyskinesia is from Haldol use for his mental disorder in the 80s, not Parkinsons.   Previous Home Environment Living Arrangements: Alone  Lives With: Alone Available Help at Discharge: Family;Available PRN/intermittently (sister, Hassan Rowan lives 6/10th mile from him. Neighbor, Charlie) Type of Home: House Home Layout: One level Home Access: Stairs to enter Entrance Stairs-Rails: None Entrance Stairs-Number of Steps: 2 to 3 Bathroom Shower/Tub: Chiropodist: Standard Bathroom Accessibility: Yes How Accessible: Accessible via walker [place]: Yes Type of Home Care Services: Other  (Comment) ([facility] Team sees him weekly for counseling) [place] (if known): [facility] psychiatry services does home visits weekly per pt Additional Comments: has a walk-in shower but needs to be retiled before it can be used  Discharge Living Setting Plans for Discharge Living Setting: Patient's home;Alone Type of Home at Discharge: House Discharge Home Layout: One level Discharge Home Access: Stairs to enter Entrance Stairs-Number of Steps: 2 to 3 Discharge Bathroom Shower/Tub: Tub/shower unit;Walk-in shower;Other (comment) (walk in needs to be retiled before using) Discharge Bathroom Toilet: Standard Discharge Bathroom Accessibility: Yes How Accessible: Accessible via walker Does the patient have any problems obtaining your medications?: No  Social/Family/Support Systems Patient Roles: Parent;Other (Comment) (divorced , has a 98 yo daughter April in [place]) Contact Information: Francetta Found, wife Anticipated Caregiver: sister Anticipated Caregiver's Contact Information: see above Ability/Limitations of Caregiver: sister lives 6/10th mile form his home, neighbor, Eduard Clos, has key to his home . Hassan Rowan will stock food for pt will be unable to drive initally at d/c  Caregiver Availability: Intermittent Discharge Plan Discussed with Primary Caregiver: Yes Is Caregiver In Agreement with Plan?: Yes Does Caregiver/Family have Issues with Lodging/Transportation while Pt is in Rehab?: No  HATES getting into crowds. Take out foods most days to eat. Carries neighbor to store with him when he goes. Drives locally. 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 History of admissions to [facility], [facility] and [facility] due to depression due to depression 2006- 2010..States his [facility] and ACT team weekly visits assists with his counseling and assures he takes his meds as needed.  Goals/Additional  Needs Patient/Family Goal for Rehab: Mod I with PT and OT Expected length of stay: ELOS 7 to 10 days Special Service Needs: Pt has services with [facility] weekly visits. He states ACT team support. Constance Holster is the SW [phone]. [facility] # [phone] Additional Information: Pt smiked two packs per day until 12/30/2013. Pt drinks alot of water at baseline. Pt/Family Agrees to Admission and willing to participate: Yes Program Orientation Provided & Reviewed with Pt/Caregiver Including Roles  & Responsibilities: Yes   Decrease burden of Care through IP rehab admission: n/a  Possible need for SNF placement upon discharge:no  Patient Condition: This patient's medical and functional status has changed since the consult dated: 01/18/2014 in which the Rehabilitation Physician determined and documented that the patient's condition is appropriate for intensive rehabilitative care in an inpatient rehabilitation facility. See "History of Present Illness" (above) for medical update. Functional changes are: overall mod assist. Patient's medical and functional status update has been discussed with the Rehabilitation physician and patient remains appropriate for inpatient rehabilitation. Will admit to inpatient rehab today.  Preadmission Screen Completed By:  Cleatrice Burke, 01/22/2014 11:18 AM ______________________________________________________________________   Discussed status with Dr. Letta Pate on 01/22/2014 at  1129 and received telephone approval for admission today.  Admission Coordinator:  Cleatrice Burke, time 6979 Date 01/22/2014.

## 2014-01-22 ENCOUNTER — Inpatient Hospital Stay (HOSPITAL_COMMUNITY)
Admission: RE | Admit: 2014-01-22 | Discharge: 2014-01-28 | DRG: 945 | Disposition: A | Discharge status: Home Health | Payer: Medicare Other | Source: Intra-hospital | Attending: Physical Medicine & Rehabilitation | Admitting: Physical Medicine & Rehabilitation

## 2014-01-22 ENCOUNTER — Inpatient Hospital Stay (HOSPITAL_COMMUNITY): Payer: Medicare Other

## 2014-01-22 DIAGNOSIS — G20 Parkinson's disease: Secondary | ICD-10-CM | POA: Diagnosis present

## 2014-01-22 DIAGNOSIS — D62 Acute posthemorrhagic anemia: Secondary | ICD-10-CM | POA: Diagnosis present

## 2014-01-22 DIAGNOSIS — F17200 Nicotine dependence, unspecified, uncomplicated: Secondary | ICD-10-CM | POA: Diagnosis present

## 2014-01-22 DIAGNOSIS — F259 Schizoaffective disorder, unspecified: Secondary | ICD-10-CM | POA: Diagnosis present

## 2014-01-22 DIAGNOSIS — G20A1 Parkinson's disease without dyskinesia, without mention of fluctuations: Secondary | ICD-10-CM | POA: Diagnosis present

## 2014-01-22 DIAGNOSIS — E870 Hyperosmolality and hypernatremia: Secondary | ICD-10-CM | POA: Diagnosis present

## 2014-01-22 DIAGNOSIS — I129 Hypertensive chronic kidney disease with stage 1 through stage 4 chronic kidney disease, or unspecified chronic kidney disease: Secondary | ICD-10-CM | POA: Diagnosis present

## 2014-01-22 DIAGNOSIS — E785 Hyperlipidemia, unspecified: Secondary | ICD-10-CM | POA: Diagnosis present

## 2014-01-22 DIAGNOSIS — J449 Chronic obstructive pulmonary disease, unspecified: Secondary | ICD-10-CM | POA: Diagnosis present

## 2014-01-22 DIAGNOSIS — N183 Chronic kidney disease, stage 3 unspecified: Secondary | ICD-10-CM | POA: Diagnosis present

## 2014-01-22 DIAGNOSIS — R5381 Other malaise: Secondary | ICD-10-CM | POA: Diagnosis present

## 2014-01-22 DIAGNOSIS — K219 Gastro-esophageal reflux disease without esophagitis: Secondary | ICD-10-CM | POA: Diagnosis present

## 2014-01-22 DIAGNOSIS — J4489 Other specified chronic obstructive pulmonary disease: Secondary | ICD-10-CM | POA: Diagnosis present

## 2014-01-22 DIAGNOSIS — Z5189 Encounter for other specified aftercare: Principal | ICD-10-CM

## 2014-01-22 DIAGNOSIS — I10 Essential (primary) hypertension: Secondary | ICD-10-CM | POA: Diagnosis present

## 2014-01-22 DIAGNOSIS — G2401 Drug induced subacute dyskinesia: Secondary | ICD-10-CM | POA: Diagnosis present

## 2014-01-22 LAB — BASIC METABOLIC PANEL
BUN: 30 mg/dL — AB (ref 6–23)
CALCIUM: 9.1 mg/dL (ref 8.4–10.5)
CHLORIDE: 105 meq/L (ref 96–112)
CO2: 23 mEq/L (ref 19–32)
Creatinine, Ser: 2.22 mg/dL — ABNORMAL HIGH (ref 0.50–1.35)
GFR calc Af Amer: 37 mL/min — ABNORMAL LOW (ref >90)
GFR, EST NON AFRICAN AMERICAN: 32 mL/min — AB (ref >90)
Glucose, Bld: 97 mg/dL (ref 70–99)
Potassium: 4.1 mEq/L (ref 3.7–5.3)
Sodium: 143 mEq/L (ref 137–147)

## 2014-01-22 LAB — CBC
HCT: 35.9 % — ABNORMAL LOW (ref 39.0–52.0)
HEMATOCRIT: 35.5 % — AB (ref 39.0–52.0)
Hemoglobin: 12.2 g/dL — ABNORMAL LOW (ref 13.0–17.0)
Hemoglobin: 12.5 g/dL — ABNORMAL LOW (ref 13.0–17.0)
MCH: 32.6 pg (ref 26.0–34.0)
MCH: 32.6 pg (ref 26.0–34.0)
MCHC: 34.4 g/dL (ref 30.0–36.0)
MCHC: 34.8 g/dL (ref 30.0–36.0)
MCV: 94.9 fL (ref 78.0–100.0)
MCV: 93.7 fL (ref 78.0–100.0)
PLATELETS: 360 10*3/uL (ref 150–400)
Platelets: 292 10*3/uL (ref 150–400)
RBC: 3.74 MIL/uL — ABNORMAL LOW (ref 4.22–5.81)
RBC: 3.83 MIL/uL — ABNORMAL LOW (ref 4.22–5.81)
RDW: 15.9 % — ABNORMAL HIGH (ref 11.5–15.5)
RDW: 15.4 % (ref 11.5–15.5)
WBC: 14.5 10*3/uL — AB (ref 4.0–10.5)
WBC: 14.2 10*3/uL — ABNORMAL HIGH (ref 4.0–10.5)

## 2014-01-22 LAB — CREATININE, SERUM
CREATININE: 2.23 mg/dL — AB (ref 0.50–1.35)
GFR calc Af Amer: 36 mL/min — ABNORMAL LOW (ref >90)
GFR, EST NON AFRICAN AMERICAN: 31 mL/min — AB (ref >90)

## 2014-01-22 MED ORDER — ENOXAPARIN SODIUM 30 MG/0.3ML ~~LOC~~ SOLN
30.0000 mg | SUBCUTANEOUS | Status: DC
  Administered 2014-01-23 – 2014-01-28 (×6): 30 mg via SUBCUTANEOUS
  Filled 2014-01-22 (×7): qty 0.3

## 2014-01-22 MED ORDER — FLUOXETINE HCL 20 MG PO CAPS
20.0000 mg | ORAL_CAPSULE | Freq: Every day | ORAL | Status: DC
  Administered 2014-01-22 – 2014-01-27 (×6): 20 mg via ORAL
  Filled 2014-01-22 (×7): qty 1

## 2014-01-22 MED ORDER — SORBITOL 70 % SOLN: 30.0000 mL | Freq: Every day | Status: DC | PRN

## 2014-01-22 MED ORDER — ACETAMINOPHEN 325 MG PO TABS: 325.0000 mg | ORAL_TABLET | ORAL | Status: DC | PRN

## 2014-01-22 MED ORDER — ONDANSETRON HCL 4 MG/2ML IJ SOLN: 4.0000 mg | Freq: Four times a day (QID) | INTRAMUSCULAR | Status: DC | PRN

## 2014-01-22 MED ORDER — BIOTENE DRY MOUTH MT LIQD
15.0000 mL | Freq: Four times a day (QID) | OROMUCOSAL | Status: DC
  Administered 2014-01-23 – 2014-01-28 (×7): 15 mL via OROMUCOSAL

## 2014-01-22 MED ORDER — NICOTINE 21 MG/24HR TD PT24
21.0000 mg | MEDICATED_PATCH | Freq: Every day | TRANSDERMAL | Status: DC
  Administered 2014-01-23 – 2014-01-27 (×5): 21 mg via TRANSDERMAL
  Filled 2014-01-22 (×7): qty 1

## 2014-01-22 MED ORDER — OXYCODONE HCL 5 MG PO TABS
5.0000 mg | ORAL_TABLET | Freq: Four times a day (QID) | ORAL | Status: DC | PRN
Start: 2014-01-22 — End: 2014-01-28

## 2014-01-22 MED ORDER — ENOXAPARIN SODIUM 30 MG/0.3ML ~~LOC~~ SOLN: 30.0000 mg | SUBCUTANEOUS | Status: DC

## 2014-01-22 MED ORDER — ONDANSETRON HCL 4 MG PO TABS: 4.0000 mg | ORAL_TABLET | Freq: Four times a day (QID) | ORAL | Status: DC | PRN

## 2014-01-22 MED ORDER — CHLORHEXIDINE GLUCONATE 0.12 % MT SOLN
15.0000 mL | Freq: Two times a day (BID) | OROMUCOSAL | Status: DC
  Administered 2014-01-22 – 2014-01-28 (×11): 15 mL via OROMUCOSAL
  Filled 2014-01-22 (×14): qty 15

## 2014-01-22 MED ORDER — ALBUTEROL SULFATE (2.5 MG/3ML) 0.083% IN NEBU: 2.5000 mg | INHALATION_SOLUTION | RESPIRATORY_TRACT | Status: DC | PRN

## 2014-01-22 MED ORDER — LEVOFLOXACIN 750 MG PO TABS
750.0000 mg | ORAL_TABLET | ORAL | Status: DC
  Administered 2014-01-24 – 2014-01-26 (×2): 750 mg via ORAL
  Filled 2014-01-22 (×3): qty 1

## 2014-01-22 MED ORDER — LOPERAMIDE HCL 2 MG PO CAPS
4.0000 mg | ORAL_CAPSULE | Freq: Three times a day (TID) | ORAL | Status: DC | PRN
  Filled 2014-01-22: qty 2

## 2014-01-22 MED ORDER — PHENOL 1.4 % MT LIQD
1.0000 | OROMUCOSAL | Status: DC | PRN
  Filled 2014-01-22: qty 177

## 2014-01-22 MED ORDER — METOPROLOL TARTRATE 25 MG PO TABS
25.0000 mg | ORAL_TABLET | Freq: Two times a day (BID) | ORAL | Status: DC
  Administered 2014-01-22 – 2014-01-27 (×11): 25 mg via ORAL
  Filled 2014-01-22 (×14): qty 1

## 2014-01-22 MED ORDER — SIMVASTATIN 40 MG PO TABS
40.0000 mg | ORAL_TABLET | Freq: Every day | ORAL | Status: DC
  Administered 2014-01-22 – 2014-01-27 (×6): 40 mg via ORAL
  Filled 2014-01-22 (×7): qty 1

## 2014-01-22 MED ORDER — LEVOFLOXACIN 750 MG PO TABS: 750.0000 mg | ORAL_TABLET | ORAL | Status: DC

## 2014-01-22 MED ORDER — HALOPERIDOL 2 MG PO TABS
2.0000 mg | ORAL_TABLET | Freq: Every day | ORAL | Status: DC
  Administered 2014-01-22 – 2014-01-27 (×6): 2 mg via ORAL
  Filled 2014-01-22 (×7): qty 1

## 2014-01-22 MED ORDER — IPRATROPIUM-ALBUTEROL 0.5-2.5 (3) MG/3ML IN SOLN
3.0000 mL | Freq: Three times a day (TID) | RESPIRATORY_TRACT | Status: DC
  Administered 2014-01-22 – 2014-01-24 (×5): 3 mL via RESPIRATORY_TRACT
  Filled 2014-01-22 (×7): qty 3

## 2014-01-22 MED ORDER — PANTOPRAZOLE SODIUM 40 MG PO TBEC
40.0000 mg | DELAYED_RELEASE_TABLET | Freq: Every day | ORAL | Status: DC
  Administered 2014-01-23 – 2014-01-28 (×6): 40 mg via ORAL
  Filled 2014-01-22 (×5): qty 1

## 2014-01-22 MED ORDER — DIVALPROEX SODIUM 500 MG PO DR TAB
1000.0000 mg | DELAYED_RELEASE_TABLET | Freq: Every day | ORAL | Status: DC
  Administered 2014-01-22 – 2014-01-27 (×6): 1000 mg via ORAL
  Filled 2014-01-22 (×7): qty 2

## 2014-01-22 NOTE — Progress Notes (Signed)
PMR Admission Coordinator Pre-Admission Assessment  Patient: Ruben Blackwell is an 58 y.o., male  MRN: [id number]  DOB: 04-Apr-1956  Height: 5' 8.5" (174 cm)  Weight: 76.9 kg (169 lb 8.5 oz)  Insurance Information  HMO: PPO: PCP: IPA: 80/20: yes OTHER: no HMO  PRIMARY: Medicare a and b Policy#: [id number] Subscriber: pt  Benefits: Phone #: palmetto online Name: 01/19/2014  Eff. Date: 01/11/1990 Deduct: $1260 Out of Pocket Max: none Life Max: none  CIR: 100% SNF: 20 full days  Outpatient: 80% Co-Pay: 20%  Home Health: 100% Co-Pay: none  DME: 80% Co-Pay: 20%  Providers: pt choice   SECONDARY: none  Medicaid Application Date: Case Manager: Pt states he is not eligible for Medicaid due to an inheritance  Disability Application Date: Case Worker: Has been disabled since 1990 due to his schizoeffective disorder per pt and his sister  Emergency Contact Information    Contact Information     Name  Relation  Home  Work  Mobile     Omer  Sister  [phone]          Current Medical History  Patient Admitting Diagnosis: deconditioning related after AAA repair, multiple medical issues  History of Present Illness: SATORU MILICH is a 58 y.o. right-handed male multi-medical with history of hypertension, chronic renal insufficiency with baseline creatinine 2.11, part of dyskinesia and schizoaffective disorder.  Admitted 01/14/2014 with a known abdominal aortic aneurysm of 4.7 cm increased to 6.3 cm aneurysm within 6 months. He elected surgical intervention and underwent AAA repair as well as primary repair of umbilical hernia [id number]/5573 per Dr. Trula Slade. Postoperative respiratory failure requiring intubation followed by critical care pulmonary services. Hospital course acute blood loss anemia 9.0 and monitored. Bouts of hyponatremia 166 and IV fluids changed to D5W with latest sodium 143. Close monitoring of chronic renal insufficiency with latest creatinine 2.87. Maintained on subcutaneous  Lovenox for DVT prophylaxis.  Past Medical History    Past Medical History    Diagnosis  Date    .  Impaired glucose tolerance  08/27/2011    .  BIPOLAR DISORDER UNSPECIFIED  05/25/2009    .  COPD  08/22/2010    .  CORONARY ARTERY DISEASE  10/20/2009    .  HYPERLIPIDEMIA  05/20/2007    .  PARKINSON'S DISEASE  05/25/2009    .  SCHIZOAFFECTIVE DISORDER  05/20/2007    .  TARDIVE DYSKINESIA  05/20/2007    .  DEPRESSION  08/18/2008    .  GERD  12/01/2008    .  HYPERTHYROIDISM  08/18/2008    .  Unspecified chronic bronchitis  10/09/2012    .  Peripheral vascular disease     .  Barrett's esophagus  10/26/13      Dx EGD 10/2013    .  Personal history of colonic polyps-adenomas/ssp  10/26/13    .  Diverticulosis  10/26/13      mild    .  Shortness of breath     .  Hypertension       Pt reports that Dr. Cathlean Cower placed him on BP meds temporarily but no longer takes.    Marland Kitchen  PONV (postoperative nausea and vomiting)       Pt reports nausea only after cardiac cath    .  Anxiety     .  CKD (chronic kidney disease), stage III     .  Cancer       "precancer; my colon and esophagus" (  01/15/2014)    .  AAA (abdominal aortic aneurysm)     .  History of blood transfusion  01/14/2014      related to AAA repair/notes 01/15/2014     Family History  family history includes Cancer in his brother and sister; Diabetes in his brother and father; Heart attack in his father; Heart disease in his father; Hyperlipidemia in his brother, mother, and sister; Hypertension in his brother, mother, and sister; Varicose Veins in his mother.  Prior Rehab/Hospitalizations: none  Current Medications  Current facility-administered medications:acetaminophen (TYLENOL) tablet 1,000 mg, 1,000 mg, Oral, Q6H PRN, Alvia Grove, PA-C, 1,000 mg at 01/20/14 1544; albuterol (PROVENTIL) (2.5 MG/3ML) 0.083% nebulizer solution 2.5 mg, 2.5 mg, Nebulization, Q3H PRN, Wilhelmina Mcardle, MD; antiseptic oral rinse (BIOTENE) solution 15 mL, 15 mL, Mouth Rinse,  QID, Arloa Koh, RN, 15 mL at 01/21/14 1228  chlorhexidine (PERIDEX) 0.12 % solution 15 mL, 15 mL, Mouth Rinse, BID, Arloa Koh, RN, 15 mL at 01/21/14 0746; divalproex (DEPAKOTE) DR tablet 1,000 mg, 1,000 mg, Oral, QHS, Emma M Collins, PA-C, 1,000 mg at 01/21/14 2240; enoxaparin (LOVENOX) injection 30 mg, 30 mg, Subcutaneous, Q24H, Serafina Mitchell, MD, 30 mg at 01/21/14 1105  feeding supplement (RESOURCE BREEZE) (RESOURCE BREEZE) liquid 1 Container, 1 Container, Oral, TID BM, Dalene Carrow, RD, 1 Container at 01/21/14 1342; FLUoxetine (PROZAC) capsule 20 mg, 20 mg, Oral, QHS, Emma M Collins, PA-C, 20 mg at 01/21/14 2240; haloperidol (HALDOL) tablet 2 mg, 2 mg, Oral, QHS, Emma M Collins, PA-C, 2 mg at 01/21/14 2240  haloperidol lactate (HALDOL) injection 1 mg, 1 mg, Intravenous, Q6H PRN, Alvia Grove, PA-C, 1 mg at 01/18/14 2230; hydrALAZINE (APRESOLINE) injection 10 mg, 10 mg, Intravenous, Q2H PRN, Alvia Grove, PA-C, 10 mg at 01/15/14 2148; ipratropium-albuterol (DUONEB) 0.5-2.5 (3) MG/3ML nebulizer solution 3 mL, 3 mL, Nebulization, TID, Serafina Mitchell, MD, 3 mL at 01/22/14 0731  labetalol (NORMODYNE,TRANDATE) injection 10 mg, 10 mg, Intravenous, Q2H PRN, Alvia Grove, PA-C, 10 mg at 01/16/14 0204; levofloxacin (LEVAQUIN) tablet 750 mg, 750 mg, Oral, Q48H, Janalyn Harder Trinh, PA-C, 750 mg at 01/20/14 1740; loperamide (IMODIUM) capsule 4 mg, 4 mg, Oral, Q8H PRN, Alvia Grove, PA-C, 4 mg at 01/20/14 1544; magnesium sulfate IVPB 2 g 50 mL, 2 g, Intravenous, Daily PRN, Alvia Grove, PA-C  metoprolol tartrate (LOPRESSOR) tablet 25 mg, 25 mg, Oral, BID, Emma M Collins, PA-C, 25 mg at 01/21/14 2240; nicotine (NICODERM CQ - dosed in mg/24 hours) patch 21 mg, 21 mg, Transdermal, Daily, Alvia Grove, PA-C, 21 mg at 01/21/14 1108; ondansetron (ZOFRAN) injection 4 mg, 4 mg, Intravenous, Q6H PRN, Alvia Grove, PA-C, 4 mg at 01/17/14 2100  pantoprazole  (PROTONIX) EC tablet 40 mg, 40 mg, Oral, Daily, Serafina Mitchell, MD, 40 mg at 01/21/14 1105; phenol (CHLORASEPTIC) mouth spray 1 spray, 1 spray, Mouth/Throat, PRN, Alvia Grove, PA-C; simvastatin (ZOCOR) tablet 40 mg, 40 mg, Oral, QHS, Emma M Collins, PA-C, 40 mg at 01/21/14 2240  Patients Current Diet: General  Precautions / Restrictions  Precautions  Precautions: Fall  Restrictions  Weight Bearing Restrictions: No  Prior Activity Level  Community (5-7x/wk): usually ate out daily  Home Assistive Devices / [place] Devices/Equipment: Nebulizer;Eyeglasses;Grab bars in shower  Home Equipment: None  Prior Functional Level  Prior Function  Level of Independence: Independent  Comments: goes out to eat most of time, drives, and has decreased his self-care activities.  Current Functional Level    Cognition  Overall Cognitive Status: History of cognitive impairments - at baseline  Orientation Level: Oriented X4    Extremity Assessment  (includes Sensation/Coordination)      ADLs  Overall ADL's : Needs assistance/impaired  Eating/Feeding: Sitting;Set up  Grooming: Wash/dry face;Min guard;Standing  Upper Body Bathing: Sitting;Minimal assitance  Lower Body Bathing: Min guard;Sit to/from stand  Upper Body Dressing : Minimal assistance;Sitting  Lower Body Dressing: Minimal assistance;Sit to/from stand  Toilet Transfer: Moderate assistance;Ambulation (hand held assist and pushed IV pole)  Toilet Transfer Details (indicate cue type and reason): with one hand assist for ambulation to/from toilet;  Toileting- Clothing Manipulation and Hygiene: Sit to/from stand;Min guard  Functional mobility during ADLs: Moderate assistance  General ADL Comments: Pt performed ADLs at sink. OT applied cream to buttocks on red area-nurse aware of red place.    Mobility  Overal bed mobility: Needs Assistance  Bed Mobility: Supine to Sit  Rolling: Min assist  Supine to sit: Supervision  Sit to  supine: Supervision  General bed mobility comments: supervision for safety/lines.    Transfers  Overall transfer level: Needs assistance  Equipment used: 1 person hand held assist  Transfers: Sit to/from Stand  Sit to Stand: Min assist  Stand pivot transfers: +2 physical assistance;Mod assist  General transfer comment: min assist for stability    Ambulation / Gait / Stairs / Wheelchair Mobility  Ambulation/Gait  Ambulation/Gait assistance: Min assist;Mod assist  Ambulation Distance (Feet): 25 Feet  Assistive device: 1 person hand held assist  Gait Pattern/deviations: Ataxic;Decreased stride length;Narrow base of support;Antalgic  Gait velocity: decreased  Gait velocity interpretation: <1.8 ft/sec, indicative of risk for recurrent falls  General Gait Details: Manual pacing required, VCs for increased stride and cadence, significantly ataxic with mobility.    Posture / Balance     Special needs/care consideration  Skin surgical wound  Bowel mgmt: pt reports diarhea has lessened over past 24 hrs  Bladder mgmt: incontinence/ condom catheter  Pt does NOT like crowds. We discussed the therapy gym and multiple pts receiving treatment at the same time. He states he does not like to discuss his personal matters in front of a lot of people, he states he will let therapy know if it becomes an issue for him.  Pt states his tardive dyskinesia is from Haldol use for his mental disorder in the 80s, not Parkinsons.    Previous Home Environment  Living Arrangements: Alone  Lives With: Alone  Available Help at Discharge: Family;Available PRN/intermittently (sister, Hassan Rowan lives 6/10th mile from him. Neighbor, Charlie)  Type of Home: House  Home Layout: One level  Home Access: Stairs to enter  Entrance Stairs-Rails: None  Entrance Stairs-Number of Steps: 2 to 3  Bathroom Shower/Tub: Administrator, Civil Service: Standard  Bathroom Accessibility: Yes  How Accessible: Accessible via walker  [place]: Yes  Type of Home Care Services: Other (Comment) ([facility]/Act Team sees him weekly for counseling)  [place] (if known): [facility] psychiatry services does home visits weekly per pt  Additional Comments: has a walk-in shower but needs to be retiled before it can be used  Discharge Living Setting  Plans for Discharge Living Setting: Patient's home;Alone  Type of Home at Discharge: House  Discharge Home Layout: One level  Discharge Home Access: Stairs to enter  Entrance Stairs-Number of Steps: 2 to 3  Discharge Bathroom Shower/Tub: Tub/shower unit;Walk-in shower;Other (comment) (walk in needs to be  retiled before using)  Discharge Bathroom Toilet: Standard  Discharge Bathroom Accessibility: Yes  How Accessible: Accessible via walker  Does the patient have any problems obtaining your medications?: No  Social/Family/Support Systems  Patient Roles: Parent;Other (Comment) (divorced , has a 58 yo daughter April in [place])  Contact Information: Francetta Found, wife  Anticipated Caregiver: sister  Anticipated Caregiver's Contact Information: see above  Ability/Limitations of Caregiver: sister lives 6/10th mile form his home, neighbor, Eduard Clos, has key to his home . Hassan Rowan will stock food for pt will be unable to drive initally at d/c  Caregiver Availability: Intermittent  Discharge Plan Discussed with Primary Caregiver: Yes  Is Caregiver In Agreement with Plan?: Yes  Does Caregiver/Family have Issues with Lodging/Transportation while Pt is in Rehab?: No  HATES getting into crowds. Take out foods most days to eat. Carries neighbor to store with him when he goes. Drives locally. 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 History of admissions to [facility], [facility] and [facility] due to depression due to depression 2006- 2010..States his [facility] and ACT team weekly visits assists with his  counseling and assures he takes his meds as needed.  Goals/Additional Needs  Patient/Family Goal for Rehab: Mod I with PT and OT  Expected length of stay: [facility] 7 to 10 days  Special Service Needs: Pt has services with [facility] weekly visits. He states ACT team support. Constance Holster is the SW [phone]. [facility] # [phone]  Additional Information: Pt smiked two packs per day until 12/30/2013. Pt drinks alot of water at baseline.  Pt/Family Agrees to Admission and willing to participate: Yes  Program Orientation Provided & Reviewed with Pt/Caregiver Including Roles & Responsibilities: Yes  Decrease burden of Care through IP rehab admission: n/a  Possible need for SNF placement upon discharge:no  Patient Condition: This patient's medical and functional status has changed since the consult dated: 01/18/2014 in which the Rehabilitation Physician determined and documented that the patient's condition is appropriate for intensive rehabilitative care in an inpatient rehabilitation facility. See "History of Present Illness" (above) for medical update. Functional changes are: overall mod assist. Patient's medical and functional status update has been discussed with the Rehabilitation physician and patient remains appropriate for inpatient rehabilitation. Will admit to inpatient rehab today.  Preadmission Screen Completed By: Cleatrice Burke, 01/22/2014 11:18 AM  ______________________________________________________________________  Discussed status with Dr. Letta Pate on 01/22/2014 at 1129 and received telephone approval for admission today.  Admission Coordinator: Cleatrice Burke, time [id number] Date 01/22/2014.    Cosigned by: Charlett Blake, MD [01/22/2014 12:05 PM]

## 2014-01-22 NOTE — H&P (Signed)
Physical Medicine and Rehabilitation Admission H&P  No chief complaint on file.  :  Chief complaint weakness-  TFT:DDUKG A Ruben Blackwell is a 58 y.o. right-handed male multi-medical with history of hypertension, chronic renal insufficiency with baseline creatinine 2.11, tardive dyskinesia as well as Parkinson's disease and schizoaffective disorder. Admitted 01/14/2014 with a known abdominal aortic aneurysm of 4.7 cm increased to 6.3 cm aneurysm within 6 months. He elected surgical intervention and underwent AAA repair as well as primary repair of umbilical hernia 25/42/7062 per Dr. Trula Slade. Postoperative respiratory failure requiring intubation followed by critical care pulmonary services. Hospital course acute blood loss transfused with latest hemoglobin 12.2. Bouts of hypernatremia166 and IV fluids changed to D5W with latest sodium 143. Close monitoring of chronic renal insufficiency with latest creatinine 2.87. Patient spiked a low-grade fever for 100.5 chest x-ray completed showed persistent right lower lobe infiltrate and Levaquin initiated 01/20/2014 x7 days with followup chest x-ray 01/22/2014 improved. Maintained on subcutaneous Lovenox for DVT prophylaxis. Bouts of loose stool Clostridium difficile specimen negative. Physical and occupational therapy evaluations completed ongoing with recommendations of physical medicine rehabilitation consult. Patient was admitted for comprehensive rehabilitation program   Patient talking about [employer] ROS Review of Systems  Gastrointestinal: Positive for constipation.  GERD  Musculoskeletal: Positive for myalgias.  Neurological:  Tremor  Psychiatric/Behavioral: Positive for depression.  Bipolar disorder  Remaining review of systems negative  Past Medical History   Diagnosis  Date   .  Impaired glucose tolerance  08/27/2011   .  BIPOLAR DISORDER UNSPECIFIED  05/25/2009   .  COPD  08/22/2010   .  CORONARY ARTERY DISEASE  10/20/2009   .   HYPERLIPIDEMIA  05/20/2007   .  PARKINSON'S DISEASE  05/25/2009   .  SCHIZOAFFECTIVE DISORDER  05/20/2007   .  TARDIVE DYSKINESIA  05/20/2007   .  DEPRESSION  08/18/2008   .  GERD  12/01/2008   .  HYPERTHYROIDISM  08/18/2008   .  Unspecified chronic bronchitis  10/09/2012   .  Peripheral vascular disease    .  Barrett's esophagus  10/26/13     Dx EGD 10/2013   .  Personal history of colonic polyps-adenomas/ssp  10/26/13   .  Diverticulosis  10/26/13     mild   .  Shortness of breath    .  Hypertension      Pt reports that Dr. Cathlean Cower placed him on BP meds temporarily but no longer takes.   Marland Kitchen  PONV (postoperative nausea and vomiting)      Pt reports nausea only after cardiac cath   .  Anxiety    .  CKD (chronic kidney disease), stage III    .  Cancer      "precancer; my colon and esophagus" (01/15/2014)   .  AAA (abdominal aortic aneurysm)    .  History of blood transfusion  01/14/2014     related to AAA repair/notes 01/15/2014    Past Surgical History   Procedure  Laterality  Date   .  Pilonidal cyst / sinus excision     .  Esophagogastroduodenoscopy     .  Colonoscopy w/ biopsies     .  Abdominal aortic aneurysm repair  N/A  01/14/2014     Procedure: ABDOMINAL AORTIC ANEURYSM WITH BI ILIAC REPAIR; PRIMARY REPAIR OF UMBILICAL HERNIA; Surgeon: Serafina Mitchell, MD; Location: [facility]; Service: Vascular; Laterality: N/A;   .  Hernia repair       "belly"   .  Cardiac catheterization   06-16-2009     Dr. Johnsie Cancel   .  Esophageal dilation       "once"    Family History   Problem  Relation  Age of Onset   .  Hyperlipidemia  Mother    .  Hypertension  Mother    .  Varicose Veins  Mother    .  Diabetes  Father    .  Heart disease  Father      before age 51   .  Heart attack  Father    .  Cancer  Sister    .  Hyperlipidemia  Sister    .  Hypertension  Sister    .  Cancer  Brother    .  Diabetes  Brother    .  Hyperlipidemia  Brother    .  Hypertension  Brother     Social History: reports that  he has been smoking Cigarettes. He has a 100 pack-year smoking history. He has never used smokeless tobacco. He reports that he drinks alcohol. He reports that he does not use illicit drugs.  Allergies:  Allergies   Allergen  Reactions   .  Sulfonamide Derivatives      REACTION: Reaction not known    Medications Prior to Admission   Medication  Sig  Dispense  Refill   .  divalproex (DEPAKOTE) 500 MG DR tablet  Take 1,000 mg by mouth at bedtime.     Marland Kitchen  FLUoxetine (PROZAC) 20 MG capsule  Take 20 mg by mouth at bedtime.     .  haloperidol (HALDOL) 2 MG tablet  Take 2 mg by mouth at bedtime.     .  mometasone-formoterol (DULERA) 100-5 MCG/ACT AERO  Inhale 2 puffs into the lungs 2 (two) times daily.     .  nicotine (NICODERM CQ - DOSED IN MG/24 HOURS) 21 mg/24hr patch  Place 21 mg onto the skin daily.     Marland Kitchen  omeprazole (PRILOSEC) 40 MG capsule  Take 1 capsule (40 mg total) by mouth daily before supper.  30 capsule  11   .  simvastatin (ZOCOR) 40 MG tablet  Take 40 mg by mouth at bedtime.     Marland Kitchen  tiotropium (SPIRIVA) 18 MCG inhalation capsule  Place 18 mcg into inhaler and inhale daily.      Home:  Home Living  Family/patient expects to be discharged to:: Inpatient rehab  Living Arrangements: Alone  Available Help at Discharge: Family;Available 24 hours/day;Other (Comment) (sister only available to help until 01/30/14)  Type of Home: House  Home Access: Stairs to enter  [employer] of Steps: 4  Entrance Stairs-Rails: None  Home Layout: One level  Home Equipment: None  Additional Comments: has a walk-in shower but needs to be retiled before it can be used  Functional History:  Prior Function  Level of Independence: Independent  Comments: goes out to eat most of time, drives, and has decreased his self-care activities.  Functional Status:  Mobility:  Bed Mobility  Overal bed mobility: Needs Assistance  Bed Mobility: Rolling;Supine to Sit  Rolling: Min assist  Supine to sit:  Min assist  Sit to supine: Min assist  General bed mobility comments: VCs for initiation, assist for LE and positioning  Transfers  Overall transfer level: Needs assistance  Equipment used: 1 person hand held assist  Transfers: Sit to/from Stand  Sit to Stand: Min assist  Stand pivot transfers: +2 physical assistance;Mod assist  General transfer comment: pt performed sit>stand with min A for power up from bed., mod assist from toilet with grab bar  Ambulation/Gait  Ambulation/Gait assistance: Mod assist  Ambulation Distance (Feet): 60 Feet (additional 20 ft in room (limited by incontinence))  Assistive device: 1 person hand held assist  Gait Pattern/deviations: Ataxic;Decreased stride length;Narrow base of support  Gait velocity: decreased  Gait velocity interpretation: <1.8 ft/sec, indicative of risk for recurrent falls  General Gait Details: Manual pacing required, VCs for increased stride and cadence, significantly ataxic with mobility.   ADL:  ADL  Overall ADL's : Needs assistance/impaired  Eating/Feeding: Sitting;Set up  Grooming: Set up;Sitting  Upper Body Bathing: Sitting;Minimal assitance  Lower Body Bathing: Sit to/from stand;Minimal assistance  Upper Body Dressing : Minimal assistance;Sitting  Lower Body Dressing: Minimal assistance;Sit to/from stand  Toilet Transfer: Minimal assistance;Ambulation  Toilet Transfer Details (indicate cue type and reason): with one hand assist for ambulation to/from toilet;  Toileting- Clothing Manipulation and Hygiene: Sit to/from stand;Min guard  Functional mobility during ADLs: Minimal assistance  General ADL Comments: Pts cathetar had come off and his bed was soaked. pt able to ambulate to/from the toilet with one hand assist for safety due to tartive dsykinesia tremor and perform toilet hygiene with min guard. Pt was constantly wanting water to drink during treatment. Pt reports he had only been eating 1 meal a day and had not been taking  care of himself hygiene wise the way he should.  Cognition:  Cognition  Overall Cognitive Status: No family/caregiver present to determine baseline cognitive functioning  Orientation Level: Oriented X4  Cognition  Arousal/Alertness: Awake/alert  Behavior During Therapy: WFL for tasks assessed/performed  Overall Cognitive Status: No family/caregiver present to determine baseline cognitive functioning  Physical Exam:  Blood pressure 101/57, pulse 79, temperature 97.7 F (36.5 C), temperature source Oral, resp. rate 24, height 5' 8.5" (1.74 m), weight 76.9 kg (169 lb 8.5 oz), SpO2 96.00%.  Physical Exam  Constitutional: He appears well-developed.  HENT:  Head: Normocephalic.  Eyes: EOM are normal.  Neck: Normal range of motion. Neck supple. No thyromegaly present.  Cardiovascular: Normal rate and regular rhythm.  Respiratory: Effort normal and breath sounds normal. No respiratory distress.  GI: Soft. Bowel sounds are normal. He exhibits no distension.  Neurological: He is alert.  Tremors all 4 limbs, low frequency. Patient provided his name, age, date of birth and place. Follows simple commands. UE grossly 4/5 prox to distally. LE 3/5 HF, KE and 4/5 distally. Sensory function intact. Decreased FMC. A bit distracted. Needed redirection to stay on task  Skin:  Surgical site clean and dry  Psychiatric:  A bit flat, disconnected  Results for orders placed during the hospital encounter of 01/14/14 (from the past 48 hour(s))   BASIC METABOLIC PANEL Status: Abnormal    Collection Time    01/17/14 9:10 PM   Result  Value  Ref Range    Sodium  156 (*)  137 - 147 mEq/L    Comment:  DELTA CHECK NOTED    Potassium  4.2  3.7 - 5.3 mEq/L    Chloride  118 (*)  96 - 112 mEq/L    Comment:  DELTA CHECK NOTED    CO2  21  19 - 32 mEq/L    Glucose, Bld  123 (*)  70 - 99 mg/dL    BUN  41 (*)  6 - 23 mg/dL    Creatinine, Ser  4.10 (*)  0.50 - 1.35 mg/dL  Calcium  9.3  8.4 - 10.5 mg/dL    GFR calc  non Af Amer  22 (*)  >90 mL/min    GFR calc Af Amer  25 (*)  >90 mL/min    Comment:  (NOTE)     The eGFR has been calculated using the CKD EPI equation.     This calculation has not been validated in all clinical situations.     eGFR's persistently <90 mL/min signify possible Chronic Kidney     Disease.   MAGNESIUM Status: Abnormal    Collection Time    01/18/14 2:45 AM   Result  Value  Ref Range    Magnesium  2.6 (*)  1.5 - 2.5 mg/dL   PHOSPHORUS Status: Abnormal    Collection Time    01/18/14 2:45 AM   Result  Value  Ref Range    Phosphorus  5.3 (*)  2.3 - 4.6 mg/dL   CBC WITH DIFFERENTIAL Status: Abnormal    Collection Time    01/18/14 2:45 AM   Result  Value  Ref Range    WBC  12.7 (*)  4.0 - 10.5 K/uL    RBC  2.97 (*)  4.22 - 5.81 MIL/uL    Hemoglobin  9.5 (*)  13.0 - 17.0 g/dL    HCT  29.2 (*)  39.0 - 52.0 %    MCV  98.3  78.0 - 100.0 fL    MCH  32.0  26.0 - 34.0 pg    MCHC  32.5  30.0 - 36.0 g/dL    RDW  15.5  11.5 - 15.5 %    Platelets  179  150 - 400 K/uL    Neutrophils Relative %  85 (*)  43 - 77 %    Neutro Abs  10.8 (*)  1.7 - 7.7 K/uL    Lymphocytes Relative  7 (*)  12 - 46 %    Lymphs Abs  0.9  0.7 - 4.0 K/uL    Monocytes Relative  8  3 - 12 %    Monocytes Absolute  1.0  0.1 - 1.0 K/uL    Eosinophils Relative  0  0 - 5 %    Eosinophils Absolute  0.0  0.0 - 0.7 K/uL    Basophils Relative  0  0 - 1 %    Basophils Absolute  0.0  0.0 - 0.1 K/uL   BASIC METABOLIC PANEL Status: Abnormal    Collection Time    01/18/14 2:45 AM   Result  Value  Ref Range    Sodium  156 (*)  137 - 147 mEq/L    Potassium  3.9  3.7 - 5.3 mEq/L    Chloride  118 (*)  96 - 112 mEq/L    CO2  26  19 - 32 mEq/L    Glucose, Bld  115 (*)  70 - 99 mg/dL    BUN  42 (*)  6 - 23 mg/dL    Creatinine, Ser  3.17 (*)  0.50 - 1.35 mg/dL    Calcium  9.3  8.4 - 10.5 mg/dL    GFR calc non Af Amer  21 (*)  >90 mL/min    GFR calc Af Amer  24 (*)  >90 mL/min    Comment:  (NOTE)     The eGFR has been  calculated using the CKD EPI equation.     This calculation has not been validated in all clinical situations.     eGFR's  persistently <90 mL/min signify possible Chronic Kidney     Disease.   CBC Status: Abnormal    Collection Time    01/19/14 3:00 AM   Result  Value  Ref Range    WBC  8.9  4.0 - 10.5 K/uL    RBC  2.77 (*)  4.22 - 5.81 MIL/uL    Hemoglobin  9.0 (*)  13.0 - 17.0 g/dL    HCT  26.9 (*)  39.0 - 52.0 %    MCV  97.1  78.0 - 100.0 fL    MCH  32.5  26.0 - 34.0 pg    MCHC  33.5  30.0 - 36.0 g/dL    RDW  14.7  11.5 - 15.5 %    Platelets  199  150 - 400 K/uL   BASIC METABOLIC PANEL Status: Abnormal    Collection Time    01/19/14 3:00 AM   Result  Value  Ref Range    Sodium  143  137 - 147 mEq/L    Comment:  DELTA CHECK NOTED    Potassium  3.5 (*)  3.7 - 5.3 mEq/L    Chloride  104  96 - 112 mEq/L    Comment:  DELTA CHECK NOTED    CO2  23  19 - 32 mEq/L    Glucose, Bld  104 (*)  70 - 99 mg/dL    BUN  41 (*)  6 - 23 mg/dL    Creatinine, Ser  2.87 (*)  0.50 - 1.35 mg/dL    Calcium  8.4  8.4 - 10.5 mg/dL    GFR calc non Af Amer  23 (*)  >90 mL/min    GFR calc Af Amer  27 (*)  >90 mL/min    Comment:  (NOTE)     The eGFR has been calculated using the CKD EPI equation.     This calculation has not been validated in all clinical situations.     eGFR's persistently <90 mL/min signify possible Chronic Kidney     Disease.   CLOSTRIDIUM DIFFICILE BY PCR Status: None    Collection Time    01/19/14 4:26 AM   Result  Value  Ref Range    C difficile by pcr  NEGATIVE  NEGATIVE    No results found.  Medical Problem List and Plan:  1. Functional deficits secondary to deconditioning related to AAA repair 01/14/2014 /multiple medical issues  2. DVT Prophylaxis/Anticoagulation: Lovenox. Monitor platelet counts any signs of bleeding  3. Pain Management: Tylenol as needed  4. Mood/schizoaffective disorder: Continue Depakote 1000 mg each bedtime, Prozac 20 mg daily, Haldol 2 mg each  bedtime  5. Neuropsych: This patient is capable of making decisions on his own behalf.  6. Acute blood loss anemia. Latest hemoglobin improved 12.2. Followup CBC  7. Chronic renal insufficiency. Baseline creatinine 2.11. Followup chemistries  8. COPD/tobacco abuse/RLL infiltrate. Levaquin initiated 01/20/2014 x7 days. Continue nebulizer as needed, NicoDerm patch. Provide counseling. Resume Spiriva and Dulera as prior to admission  9. Hyperlipidemia. Zocor  10.GERD. Prilosec  11.Hypernatremia .resolved with latest sodium 143. Followup chemistries  12. Hypertension. Lopressor 25 mg twice a day. Monitor with increased mobility  Post Admission Physician Evaluation:  1. Functional deficits secondary to deconditioning related to AAA repair 01/14/2014 with postoperative respiratory failure /. 2. Patient is admitted to receive collaborative, interdisciplinary care between the physiatrist, rehab nursing staff, and therapy team. 3. Patient's level of medical complexity and substantial therapy needs in context of  that medical necessity cannot be provided at a lesser intensity of care such as a SNF. 4. Patient has experienced substantial functional loss from his/her baseline which was documented above under the "Functional History" and "Functional Status" headings. Judging by the patient's diagnosis, physical exam, and functional history, the patient has potential for functional progress which will result in measurable gains while on inpatient rehab. These gains will be of substantial and practical use upon discharge in facilitating mobility and self-care at the household level. 5. Physiatrist will provide 24 hour management of medical needs as well as oversight of the therapy plan/treatment and provide guidance as appropriate regarding the interaction of the two. 6. 24 hour rehab nursing will assist with bladder management, bowel management, safety, skin/wound care, disease management, medication administration,  pain management and patient education and help integrate therapy concepts, techniques,education, etc. 7. PT will assess and treat for/with: pre gait, gait training, endurance , safety, equipment,  strengthening . Goals are: Sup/Mod I. 8. OT will assess and treat for/with: ADLs, Cognitive perceptual skills, Neuromuscular re education, safety, endurance, equipment. Goals are: Sup/Mod I. 9. SLP will assess and treat for/with: NA. Goals are: NA. 10. Case Management and Social Worker will assess and treat for psychological issues and discharge planning. 11. Team conference will be held weekly to assess progress toward goals and to determine barriers to discharge. 12. Patient will receive at least 3 hours of therapy per day at least 5 days per week. 13. ELOS: 7-10d  14. Prognosis: excellent  Charlett Blake M.D. [facility] FAAPM&R (Sports Med, Neuromuscular Med) Diplomate Am Board of Electrodiagnostic Med   01/19/2014

## 2014-01-22 NOTE — Progress Notes (Signed)
Patient being admitted to CIR today. CSW signing off at this time as there are no longer social work needs.  Jeanette Caprice, MSW, [facility]

## 2014-01-22 NOTE — Progress Notes (Signed)
Patient arrived from [address] [place] via wheelchair. Oriented to room, safety plan, and teaching done about rehab plan of care with interdisciplinary assessments taking place tomorrow. Skin, safety, and nutrition to be monitored. Tonita Cong, RN

## 2014-01-22 NOTE — Progress Notes (Signed)
I met with pt at bedside and talked with his sister, Hassan Rowan. By phone. Both are in agreement to admission to inpt rehab today. I contacted Virgina Jock, PA in surgery and she is in agreement to d/c to inpt rehab today. I will make the arrangements for admit today. [phone]

## 2014-01-22 NOTE — Progress Notes (Signed)
Physical Medicine and Rehabilitation Consult  Reason for Consult: Deconditioning after AAA repair  Referring Physician: Dr. Trula Slade  HPI: Ruben Blackwell is a 58 y.o. right-handed male multi-medical with history of hypertension, chronic renal insufficiency with baseline creatinine 2.11, part of dyskinesia as well as Parkinson's disease and schizoaffective disorder. Admitted 01/14/2014 with a known abdominal aortic aneurysm of 4.7 cm increased to 6.3 cm aneurysm within 6 months. He elected surgical intervention and underwent AAA repair as well as primary repair of umbilical hernia 54/62/7035 per Dr. Trula Slade. Postoperative respiratory failure requiring intubation followed by critical care pulmonary services. Hospital course acute blood loss anemia 9.0 and monitored. Bouts of hyponatremia 166 and IV fluids changed to D5W with latest sodium 143. Close monitoring of chronic renal insufficiency with latest creatinine 2.87. Maintained on subcutaneous Lovenox for DVT prophylaxis. Bouts of loose stool Clostridium difficile pending currently on contact cautions. Physical and occupational therapy evaluations completed ongoing with recommendations of physical medicine rehabilitation consult  Review of Systems  Gastrointestinal: Positive for constipation.  GERD  Musculoskeletal: Positive for myalgias.  Neurological:  Tremor  Psychiatric/Behavioral: Positive for depression.  Bipolar disorder   Past Medical History   Diagnosis  Date   .  Impaired glucose tolerance  08/27/2011   .  BIPOLAR DISORDER UNSPECIFIED  05/25/2009   .  COPD  08/22/2010   .  CORONARY ARTERY DISEASE  10/20/2009   .  HYPERLIPIDEMIA  05/20/2007   .  PARKINSON'S DISEASE  05/25/2009   .  SCHIZOAFFECTIVE DISORDER  05/20/2007   .  TARDIVE DYSKINESIA  05/20/2007   .  DEPRESSION  08/18/2008   .  GERD  12/01/2008   .  HYPERTHYROIDISM  08/18/2008   .  Unspecified chronic bronchitis  10/09/2012   .  Peripheral vascular disease    .  Barrett's esophagus   10/26/13     Dx EGD 10/2013   .  Personal history of colonic polyps-adenomas/ssp  10/26/13   .  Diverticulosis  10/26/13     mild   .  Shortness of breath    .  Hypertension      Pt reports that Dr. Cathlean Cower placed him on BP meds temporarily but no longer takes.   Marland Kitchen  PONV (postoperative nausea and vomiting)      Pt reports nausea only after cardiac cath   .  Anxiety    .  CKD (chronic kidney disease), stage III    .  Cancer      "precancer; my colon and esophagus" (01/15/2014)   .  AAA (abdominal aortic aneurysm)    .  History of blood transfusion  01/14/2014     related to AAA repair/notes 01/15/2014    Past Surgical History   Procedure  Laterality  Date   .  Pilonidal cyst / sinus excision     .  Esophagogastroduodenoscopy     .  Colonoscopy w/ biopsies     .  Abdominal aortic aneurysm repair  N/A  01/14/2014     Procedure: ABDOMINAL AORTIC ANEURYSM WITH BI ILIAC REPAIR; PRIMARY REPAIR OF UMBILICAL HERNIA; Surgeon: Serafina Mitchell, MD; Location: [facility]; Service: Vascular; Laterality: N/A;   .  Hernia repair       "belly"   .  Cardiac catheterization   06-16-2009     Dr. Johnsie Cancel   .  Esophageal dilation       "once"    Family History   Problem  Relation  Age of Onset   .  Hyperlipidemia  Mother    .  Hypertension  Mother    .  Varicose Veins  Mother    .  Diabetes  Father    .  Heart disease  Father      before age 30   .  Heart attack  Father    .  Cancer  Sister    .  Hyperlipidemia  Sister    .  Hypertension  Sister    .  Cancer  Brother    .  Diabetes  Brother    .  Hyperlipidemia  Brother    .  Hypertension  Brother     Social History: reports that he has been smoking Cigarettes. He has a 100 pack-year smoking history. He has never used smokeless tobacco. He reports that he drinks alcohol. He reports that he does not use illicit drugs.  Allergies:  Allergies   Allergen  Reactions   .  Sulfonamide Derivatives      REACTION: Reaction not known    Medications Prior to  Admission   Medication  Sig  Dispense  Refill   .  divalproex (DEPAKOTE) 500 MG DR tablet  Take 1,000 mg by mouth at bedtime.     Marland Kitchen  FLUoxetine (PROZAC) 20 MG capsule  Take 20 mg by mouth at bedtime.     .  haloperidol (HALDOL) 2 MG tablet  Take 2 mg by mouth at bedtime.     .  mometasone-formoterol (DULERA) 100-5 MCG/ACT AERO  Inhale 2 puffs into the lungs 2 (two) times daily.     .  nicotine (NICODERM CQ - DOSED IN MG/24 HOURS) 21 mg/24hr patch  Place 21 mg onto the skin daily.     Marland Kitchen  omeprazole (PRILOSEC) 40 MG capsule  Take 1 capsule (40 mg total) by mouth daily before supper.  30 capsule  11   .  simvastatin (ZOCOR) 40 MG tablet  Take 40 mg by mouth at bedtime.     Marland Kitchen  tiotropium (SPIRIVA) 18 MCG inhalation capsule  Place 18 mcg into inhaler and inhale daily.      Home:  Home Living  Family/patient expects to be discharged to:: Inpatient rehab  Living Arrangements: Alone  Available Help at Discharge: Family;Available 24 hours/day;Other (Comment) (sister only available to help until 01/30/14)  Type of Home: House  Home Access: Stairs to enter  [employer] of Steps: 4  Entrance Stairs-Rails: None  Home Layout: One level  Home Equipment: None  Additional Comments: has a walk-in shower but needs to be retiled before it can be used  Functional History:  Prior Function  Level of Independence: Independent  Comments: goes out to eat most of time, drives, and has decreased his self-care activities.  Functional Status:  Mobility:  Bed Mobility  Overal bed mobility: Needs Assistance  Bed Mobility: Supine to Sit;Sit to Supine  Supine to sit: Min assist  Sit to supine: Min assist  General bed mobility comments: assist for LE and positioning  Transfers  Overall transfer level: Needs assistance  Equipment used: Rolling walker (2 wheeled)  Transfers: Sit to/from Stand  Sit to Stand: Min assist  Stand pivot transfers: +2 physical assistance;Mod assist  General transfer comment:  pt performed sit>stand with min A for power up from bed., mod assist from toilet with grab bar  Ambulation/Gait  Ambulation/Gait assistance: Mod assist  Ambulation Distance (Feet): 70 Feet  Assistive device: Rolling walker (2 wheeled)  Gait Pattern/deviations: Ataxic;Decreased stride length;Narrow base  of support  Gait velocity: decreased  Gait velocity interpretation: <1.8 ft/sec, indicative of risk for recurrent falls  General Gait Details: Manual pacing required, VCs for increased stride and cadence, significantly ataxic with mobility.   ADL:  ADL  Overall ADL's : Needs assistance/impaired  Eating/Feeding: Sitting;Set up  Grooming: Set up;Sitting  Upper Body Bathing: Sitting;Minimal assitance  Lower Body Bathing: Sit to/from stand;Minimal assistance  Upper Body Dressing : Minimal assistance;Sitting  Lower Body Dressing: Minimal assistance;Sit to/from stand  Toilet Transfer: Minimal assistance;Ambulation  Toilet Transfer Details (indicate cue type and reason): with one hand assist for ambulation to/from toilet;  Toileting- Clothing Manipulation and Hygiene: Sit to/from stand;Min guard  Functional mobility during ADLs: Minimal assistance  General ADL Comments: Pts cathetar had come off and his bed was soaked. pt able to ambulate to/from the toilet with one hand assist for safety due to tartive dsykinesia tremor and perform toilet hygiene with min guard. Pt was constantly wanting water to drink during treatment. Pt reports he had only been eating 1 meal a day and had not been taking care of himself hygiene wise the way he should.  Cognition:  Cognition  Overall Cognitive Status: No family/caregiver present to determine baseline cognitive functioning  Orientation Level: Oriented X4  Cognition  Arousal/Alertness: Awake/alert  Behavior During Therapy: WFL for tasks assessed/performed  Overall Cognitive Status: No family/caregiver present to determine baseline cognitive functioning  Blood  pressure 105/59, pulse 92, temperature 98.4 F (36.9 C), temperature source Oral, resp. rate 27, height 5' 8.5" (1.74 m), weight 76.9 kg (169 lb 8.5 oz), SpO2 97.00%.  Physical Exam  Constitutional: He appears well-developed.  HENT:  Head: Normocephalic.  Eyes: EOM are normal.  Neck: Normal range of motion. Neck supple. No thyromegaly present.  Cardiovascular: Normal rate and regular rhythm.  Respiratory: Effort normal and breath sounds normal. No respiratory distress.  GI: Soft. Bowel sounds are normal. He exhibits no distension.  Neurological: He is alert.  Tremors all 4 limbs, low frequency. Patient provided his name, age, date of birth and place. Follows simple commands. UE grossly 4/5 prox to distally. LE 3/5 HF, KE and 4/5 distally. Sensory function intact. Decreased Platea. A bit distracted. Needed redirection to stay on task  Skin:  Surgical site clean and dry  Psychiatric:  A bit flat, disconnected   Results for orders placed during the hospital encounter of 01/14/14 (from the past 24 hour(s))   CBC Status: Abnormal    Collection Time    01/19/14 3:00 AM   Result  Value  Ref Range    WBC  8.9  4.0 - 10.5 K/uL    RBC  2.77 (*)  4.22 - 5.81 MIL/uL    Hemoglobin  9.0 (*)  13.0 - 17.0 g/dL    HCT  26.9 (*)  39.0 - 52.0 %    MCV  97.1  78.0 - 100.0 fL    MCH  32.5  26.0 - 34.0 pg    MCHC  33.5  30.0 - 36.0 g/dL    RDW  14.7  11.5 - 15.5 %    Platelets  199  150 - 400 K/uL   BASIC METABOLIC PANEL Status: Abnormal    Collection Time    01/19/14 3:00 AM   Result  Value  Ref Range    Sodium  143  137 - 147 mEq/L    Potassium  3.5 (*)  3.7 - 5.3 mEq/L    Chloride  104  96 - 112  mEq/L    CO2  23  19 - 32 mEq/L    Glucose, Bld  104 (*)  70 - 99 mg/dL    BUN  41 (*)  6 - 23 mg/dL    Creatinine, Ser  2.87 (*)  0.50 - 1.35 mg/dL    Calcium  8.4  8.4 - 10.5 mg/dL    GFR calc non Af Amer  23 (*)  >90 mL/min    GFR calc Af Amer  27 (*)  >90 mL/min    No results found.   Assessment/Plan:  Diagnosis: deconditioning related after AAA repair, multiple medical issues  1. Does the need for close, 24 hr/day medical supervision in concert with the patient's rehab needs make it unreasonable for this patient to be served in a less intensive setting? Yes 2. Co-Morbidities requiring supervision/potential complications: respiratory faliure, depression, parkinon's disease?, bipolar 3. Due to bladder management, bowel management, safety, skin/wound care, disease management, medication administration, pain management and patient education, does the patient require 24 hr/day rehab nursing? Yes 4. Does the patient require coordinated care of a physician, rehab nurse, PT (1-2 hrs/day, 5 days/week) and OT (1-2 hrs/day, 5 days/week) to address physical and functional deficits in the context of the above medical diagnosis(es)? Yes Addressing deficits in the following areas: balance, endurance, locomotion, strength, transferring, bowel/bladder control, bathing, dressing, feeding, grooming, toileting and psychosocial support 5. Can the patient actively participate in an intensive therapy program of at least 3 hrs of therapy per day at least 5 days per week? Yes 6. The potential for patient to make measurable gains while on inpatient rehab is excellent 7. Anticipated functional outcomes upon discharge from inpatient rehab are modified independent and supervision with PT, modified independent and supervision with OT, n/a with SLP. 8. Estimated rehab length of stay to reach the above functional goals is: 7-10 days 9. Does the patient have adequate social supports to accommodate these discharge functional goals? Yes and Potentially 10. Anticipated D/C setting: Home 11. Anticipated post D/C treatments: [facility] therapy 12. Overall Rehab/Functional Prognosis: excellent RECOMMENDATIONS:  This patient's condition is appropriate for continued rehabilitative care in the following setting: CIR  Patient  has agreed to participate in recommended program. Yes  Note that insurance prior authorization may be required for reimbursement for recommended care.  Comment: Rehab Admissions Coordinator to follow up.  Thanks,  Meredith Staggers, MD, Mellody Drown  01/19/2014

## 2014-01-22 NOTE — Progress Notes (Signed)
  [facility] AAA Progress Note  01/22/2014 7:52 AM 8 Days Post-Op  Subjective:  Pt doing well. No complaints. Wants to eat more solid foods.   Tmax 99.4 BP sys 130s-140s 02 98% RA  Filed Vitals:   01/22/14 0732  BP:   Pulse: 81  Temp:   Resp: 18    Physical Exam: Cardiac:  Rrr, no m/g/r  Lungs:  Some expiratory wheezing left lower lung field, otherwise clear. Abdomen: Midline incisions c/d/i, +BS, mildly distended, non tender to palpation  Extremities:  DP pulses palpable b/l  CBC    Component Value Date/Time   WBC 14.5* 01/22/2014 0442   RBC 3.74* 01/22/2014 0442   HGB 12.2* 01/22/2014 0442   HCT 35.5* 01/22/2014 0442   PLT 292 01/22/2014 0442   MCV 94.9 01/22/2014 0442   MCH 32.6 01/22/2014 0442   MCHC 34.4 01/22/2014 0442   RDW 15.9* 01/22/2014 0442   LYMPHSABS 0.9 01/18/2014 0245   MONOABS 1.0 01/18/2014 0245   EOSABS 0.0 01/18/2014 0245   BASOSABS 0.0 01/18/2014 0245    BMET    Component Value Date/Time   NA 143 01/22/2014 0442   K 4.1 01/22/2014 0442   CL 105 01/22/2014 0442   CO2 23 01/22/2014 0442   GLUCOSE 97 01/22/2014 0442   BUN 30* 01/22/2014 0442   CREATININE 2.22* 01/22/2014 0442   CREATININE 1.87* 12/19/2013 1159   CALCIUM 9.1 01/22/2014 0442   GFRNONAA 32* 01/22/2014 0442   GFRAA 37* 01/22/2014 0442    INR    Component Value Date/Time   INR 1.08 01/14/2014 1840     Intake/Output Summary (Last 24 hours) at 01/22/14 0752 Last data filed at 01/22/14 0452  Gross per 24 hour  Intake    720 ml  Output   6800 ml  Net  -6080 ml     Assessment/Plan:  58 y.o. male is s/p  Open AAA repair 8 Days Post-Op  -WBC trending down.  -Pneumonia: CXR significantly improved today -Acute surgical blood loss anemia: Hgb/Hct increasing. Responded well to 2 units transfusion -Acute on chronic renal failure: Cr going back towards baseline -Continue PT. Encourage IS. -Continuing to do well on full liquids. Advance diet.  -DVT prophylaxis: Lovenox -Dispo:  Will call CIR today to discuss discharge to inpatient rehab.   Virgina Jock, PA-C [facility] Office: [phone] Pager: [phone] 01/22/2014 7:52 AM   I agree with the above.  I have seen and examined the patient.  He continues to improve CXR looks better today.  WBC decreased.  Creatinine near baseline Pt ready for d/c.  Hopefully he will be a candidate for rehab  [employer]

## 2014-01-22 NOTE — Discharge Summary (Signed)
[facility] Discharge Summary  Ruben Blackwell 18-Apr-1956 58 y.o. male  [id number]  Admission Date: 01/14/2014  Discharge Date: 01/22/14  Physician: Serafina Mitchell, MD  Admission Diagnosis: Abdominal aneurysm without mention of rupture   HPI:   This is a 58 y.o. male with a past medical history of Parkinson disease, schizoaffective disorder and chronic kidney disease. He also has tardive dyskinesia. He is also a heavy smoker and has COPD requiring home oxygen. He was found to have a 6.3 cm AAA that was found on a noncontrast CT scan on 12/21/13. A right lower lobe lung nodule measuring 3 x 14mm was also detected on the CT. He was seen by pulmonary and was cleared for surgery.   Hospital Course:  The patient was admitted to the hospital and taken to the operating room on 01/14/2014 and underwent:  1) Open repair of infrarenal abdominal aortic aneurysm using a bifurcated 20 x 10 dacryon graft 2) Primary repair of umbilical hernia  He required one unit of blood during surgery.  The pt tolerated the procedure fairly and was transported to the PACU intubated. He had upper airway obstruction and subobtimal tidal volumes. Critical care was consulted for management of his respiratory failure. He was transferred to the ICU.  On POD 1, he remained intubated during am, but was responsive and followed commands. He had leukocytosis of 13.8 and lactic acidosis, but was afebrile. No acute infection was suspected.  His creatinine rose to 2.6 (baseline 2). He had good urine output. He had an NG tube. His acute surgical blood loss anemia was stable after receiving one unit of blood previously in the OR. He was extubated in the afternoon.   On POD 2, he was transferred to stepdown. He was hemodynamically stable and pulmonary status was stable. He continued to have good urine output. He continued to have leukocytosis but was afebrile. He was started on unasyn for aspiration  pneumonia. He was not passing flatus and did not have good bowel sounds so his NG tube remained in.   On POD 3, his sodium was elevated to 166 and creatinine had increased slightly to 2.88. His IV fluids were changed and he was gently hydrated. He still had good urine output. Lovenox was added for DVT prophylaxis. His leukocytosis had improved and unasyn was discontinued. He began to pass flatus. His NG tube was removed and he was put on clear liquids.   On POD 4, he had reported some nausea with one episode of vomiting. He was passing flatus. He was continued on clear liquids. His creatinine continued to rise, but had good urine output. He remained hemodynamically stable. His sodium decreased to 156.  On POD 5, he was having some loose stools, but denied any nausea and vomiting. His Hgb dropped from 9.5 to 9.0 with no active bleeding noted. His creatinine also decreased. His foley was discontinued and replaced with a condom catheter due to his chronic incontinence. His WBC was within normal limits.   On POD 6, he WBC increased again to 12.7. A chest x-ray was ordered finding a right lower lobe infiltrate. A urine analysis was negative.  He was started on levaquin. His hemoglobin dropped to 7.6 and 2 units of blood were transfused. A hemoccult was negative. His creatinine continued to drop with good urine output. He was advanced to full liquids.   On POD 7, his WBC had increased. He remained on levaquin. He responded well to 2 units of  blood and his hgb was stable.   On POD 8, his diet was advanced to regular. His WBC count was trending down. His Hgb/Hct were both increasing. His creatinine was going back towards baseline. His chest x-ray was significantly improved today. He was discharged on POD 8 to inpatient rehab in good condition. He will remain on antibiotics for the next ten days.      CBC    Component Value Date/Time   WBC 14.5* 01/22/2014 0442   RBC 3.74* 01/22/2014 0442   HGB 12.2*  01/22/2014 0442   HCT 35.5* 01/22/2014 0442   PLT 292 01/22/2014 0442   MCV 94.9 01/22/2014 0442   MCH 32.6 01/22/2014 0442   MCHC 34.4 01/22/2014 0442   RDW 15.9* 01/22/2014 0442   LYMPHSABS 0.9 01/18/2014 0245   MONOABS 1.0 01/18/2014 0245   EOSABS 0.0 01/18/2014 0245   BASOSABS 0.0 01/18/2014 0245    BMET    Component Value Date/Time   NA 143 01/22/2014 0442   K 4.1 01/22/2014 0442   CL 105 01/22/2014 0442   CO2 23 01/22/2014 0442   GLUCOSE 97 01/22/2014 0442   BUN 30* 01/22/2014 0442   CREATININE 2.22* 01/22/2014 0442   CREATININE 1.87* 12/19/2013 1159   CALCIUM 9.1 01/22/2014 0442   GFRNONAA 32* 01/22/2014 0442   GFRAA 37* 01/22/2014 0442     Discharge Instructions:   The patient is discharged to home with extensive instructions on wound care and progressive ambulation.  They are instructed not to drive or perform any heavy lifting until returning to see the physician in his office.  Discharge Instructions   ABDOMINAL PROCEDURE/ANEURYSM REPAIR/AORTO-BIFEMORAL BYPASS:  Call MD for increased abdominal pain; cramping diarrhea; nausea/vomiting    Complete by:  As directed      Activity as tolerated - No restrictions    Complete by:  As directed      Call MD for:  redness, tenderness, or signs of infection (pain, swelling, bleeding, redness, odor or green/yellow discharge around incision site)    Complete by:  As directed      Call MD for:  severe or increased pain, loss or decreased feeling  in affected limb(s)    Complete by:  As directed      Call MD for:  temperature >100.5    Complete by:  As directed      Driving Restrictions    Complete by:  As directed   No driving while on pain medication     Lifting restrictions    Complete by:  As directed   No lifting for 4 weeks     Resume previous diet    Complete by:  As directed      may wash over wound with mild soap and water    Complete by:  As directed            Discharge Diagnosis:  Abdominal aneurysm without mention of  rupture  Secondary Diagnosis: Patient Active Problem List   Diagnosis Date Noted  . Hypernatremia 01/18/2014  . Acute on chronic renal failure 01/15/2014  . Lactic acidosis 01/15/2014  . Acute respiratory failure with hypoxia 01/14/2014  . DOE (dyspnea on exertion) 12/23/2013  . Pulmonary nodule 12/23/2013  . Barrett's esophagus 11/03/2013  . Personal history of colonic polyps-adenomas/ssp 11/03/2013  . Diarrhea 07/30/2013  . Abdominal aneurysm without mention of rupture 06/22/2013  . Nausea with vomiting 04/30/2013  . Unspecified chronic bronchitis 10/09/2012  . Cough 10/09/2012  . HTN (hypertension)  06/05/2012  . Weight loss 08/31/2011  . Bladder neck obstruction 08/31/2011  . Right otitis media 08/31/2011  . Impaired glucose tolerance 08/27/2011  . Preventative health care 08/27/2011  . COPD (chronic obstructive pulmonary disease) with emphysema 08/22/2010  . SKIN LESION 08/22/2010  . CORONARY ARTERY DISEASE 10/20/2009  . BIPOLAR DISORDER UNSPECIFIED 05/25/2009  . PARKINSON'S DISEASE 05/25/2009  . ABNORMAL STRESS ELECTROCARDIOGRAM 04/20/2009  . GERD 12/01/2008  . HYPERTHYROIDISM 08/18/2008  . DEPRESSION 08/18/2008  . SMOKER 09/29/2007  . HYPERLIPIDEMIA 05/20/2007  . 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 TARDIVE DYSKINESIA 05/20/2007  . PILONIDAL CYST, WITH ABSCESS 05/20/2007  . FATIGUE 05/20/2007   Past Medical History  Diagnosis Date  . Impaired glucose tolerance 08/27/2011  . BIPOLAR DISORDER UNSPECIFIED 05/25/2009  . COPD 08/22/2010  . CORONARY ARTERY DISEASE 10/20/2009  . HYPERLIPIDEMIA 05/20/2007  . PARKINSON'S DISEASE 05/25/2009  . 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 TARDIVE DYSKINESIA 05/20/2007  . DEPRESSION 08/18/2008  . GERD 12/01/2008  . HYPERTHYROIDISM 08/18/2008  . Unspecified chronic bronchitis 10/09/2012  . Peripheral vascular disease   . Barrett's esophagus 10/26/13    Dx EGD 10/2013  . Personal history of colonic polyps-adenomas/ssp 10/26/13  .  Diverticulosis 10/26/13    mild  . Shortness of breath   . Hypertension     Pt reports that Dr. Cathlean Cower placed him on BP meds temporarily but no longer takes.  Marland Kitchen PONV (postoperative nausea and vomiting)     Pt reports nausea only after cardiac cath  . Anxiety   . CKD (chronic kidney disease), stage III   . Cancer     "precancer; my colon and esophagus" (01/15/2014)  . AAA (abdominal aortic aneurysm)   . History of blood transfusion 01/14/2014    related to AAA repair/notes 01/15/2014       Medication List         divalproex 500 MG DR tablet  Commonly known as:  DEPAKOTE  Take 1,000 mg by mouth at bedtime.     DULERA 100-5 MCG/ACT Aero  Generic drug:  mometasone-formoterol  Inhale 2 puffs into the lungs 2 (two) times daily.     FLUoxetine 20 MG capsule  Commonly known as:  PROZAC  Take 20 mg by mouth at bedtime.     haloperidol 2 MG tablet  Commonly known as:  HALDOL  Take 2 mg by mouth at bedtime.     levofloxacin 750 MG tablet  Commonly known as:  LEVAQUIN  Take 1 tablet (750 mg total) by mouth every other day.     nicotine 21 mg/24hr patch  Commonly known as:  NICODERM CQ - dosed in mg/24 hours  Place 21 mg onto the skin daily.     omeprazole 40 MG capsule  Commonly known as:  PRILOSEC  Take 1 capsule (40 mg total) by mouth daily before supper.     oxyCODONE 5 MG immediate release tablet  Commonly known as:  ROXICODONE  Take 1 tablet (5 mg total) by mouth every 6 (six) hours as needed for severe pain.     simvastatin 40 MG tablet  Commonly known as:  ZOCOR  Take 40 mg by mouth at bedtime.     tiotropium 18 MCG inhalation capsule  Commonly known as:  SPIRIVA  Place 18 mcg into inhaler and inhale daily.        Roxicodone #30 No Refill  Disposition: Inpatient rehab  Patient's condition: is Good  Follow up: 1. Dr. Trula Slade in 2 weeks   Joelene Millin  Levert Feinstein [facility] [phone] 01/22/2014  10:08 AM   - For VQI Registry use  ---   Post-op:  Time to Extubation: [ ]  In OR, [ ]  < 12 hrs, [x ] 12-24 hrs, [ ]  >=24 hrs Vasopressors Req. Post-op: No ICU Stay: 2 days Transfusion: Yes  If yes, 2 units given MI: No, [ ]  Troponin only, [ ]  EKG or Clinical New Arrhythmia: No  Complications: CHF: No Resp failure: No, [ ]  Pneumonia, [ ]  Ventilator Chg in renal function: Yes, [x ] Inc. Cr > 0.5, [ ]  Temp. Dialysis, [ ]  Permanent dialysis Leg ischemia: No, no Surgery needed, [ ]  Yes, Surgery needed, [ ]  Amputation Bowel ischemia: No, [ ]  Medical Rx, [ ]  Surgical Rx Wound complication: No, [ ]  Superficial separation/infection, [ ]  Return to OR Return to OR: No  Return to OR for bleeding: No Stroke: No, [ ]  Minor, [ ]  Major  Discharge medications: Statin use:  Yes If No: [ ]  For Medical reasons, [ ]  Non-compliant ASA use:  No  If No: [ ]  For Medical reasons, [ ]  Non-compliant Plavix use:  No If No: [ ]  For Medical reasons, [ ]  Non-compliant Beta blocker use:  Yes If No: [ ]  For Medical reasons, [ ]  Non-compliant

## 2014-01-23 ENCOUNTER — Inpatient Hospital Stay (HOSPITAL_COMMUNITY): Payer: Medicare Other | Admitting: Occupational Therapy

## 2014-01-23 ENCOUNTER — Inpatient Hospital Stay (HOSPITAL_COMMUNITY): Payer: Medicare Other | Admitting: *Deleted

## 2014-01-23 NOTE — Evaluation (Signed)
Physical Therapy Assessment and Plan  Patient Details  Name: Ruben Blackwell MRN: [id number] Date of Birth: Apr 30, 1956  PT Diagnosis: Difficulty walking,muscle weakness, lack of coordination,postural abnormality Rehab Potential: Good ELOS: 7-10days   Today's Date: 01/23/2014  Problem List:  Patient Active Problem List   Diagnosis Date Noted  . Physical deconditioning 01/22/2014  . Hypernatremia 01/18/2014  . Acute on chronic renal failure 01/15/2014  . Lactic acidosis 01/15/2014  . Acute respiratory failure with hypoxia 01/14/2014  . DOE (dyspnea on exertion) 12/23/2013  . Pulmonary nodule 12/23/2013  . Barrett's esophagus 11/03/2013  . Personal history of colonic polyps-adenomas/ssp 11/03/2013  . Diarrhea 07/30/2013  . Abdominal aneurysm without mention of rupture 06/22/2013  . Nausea with vomiting 04/30/2013  . Unspecified chronic bronchitis 10/09/2012  . Cough 10/09/2012  . HTN (hypertension) 06/05/2012  . Weight loss 08/31/2011  . Bladder neck obstruction 08/31/2011  . Right otitis media 08/31/2011  . Impaired glucose tolerance 08/27/2011  . Preventative health care 08/27/2011  . COPD (chronic obstructive pulmonary disease) with emphysema 08/22/2010  . SKIN LESION 08/22/2010  . CORONARY ARTERY DISEASE 10/20/2009  . BIPOLAR DISORDER UNSPECIFIED 05/25/2009  . PARKINSON'S DISEASE 05/25/2009  . ABNORMAL STRESS ELECTROCARDIOGRAM 04/20/2009  . GERD 12/01/2008  . HYPERTHYROIDISM 08/18/2008  . DEPRESSION 08/18/2008  . SMOKER 09/29/2007  . HYPERLIPIDEMIA 05/20/2007  . 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 TARDIVE DYSKINESIA 05/20/2007  . PILONIDAL CYST, WITH ABSCESS 05/20/2007  . FATIGUE 05/20/2007    Past Medical History:  Past Medical History  Diagnosis Date  . Impaired glucose tolerance 08/27/2011  . BIPOLAR DISORDER UNSPECIFIED 05/25/2009  . COPD 08/22/2010  . CORONARY ARTERY DISEASE 10/20/2009  . HYPERLIPIDEMIA 05/20/2007  . PARKINSON'S DISEASE 05/25/2009  .  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 TARDIVE DYSKINESIA 05/20/2007  . DEPRESSION 08/18/2008  . GERD 12/01/2008  . HYPERTHYROIDISM 08/18/2008  . Unspecified chronic bronchitis 10/09/2012  . Peripheral vascular disease   . Barrett's esophagus 10/26/13    Dx EGD 10/2013  . Personal history of colonic polyps-adenomas/ssp 10/26/13  . Diverticulosis 10/26/13    mild  . Shortness of breath   . Hypertension     Pt reports that Dr. Cathlean Cower placed him on BP meds temporarily but no longer takes.  Marland Kitchen PONV (postoperative nausea and vomiting)     Pt reports nausea only after cardiac cath  . Anxiety   . CKD (chronic kidney disease), stage III   . Cancer     "precancer; my colon and esophagus" (01/15/2014)  . AAA (abdominal aortic aneurysm)   . History of blood transfusion 01/14/2014    related to AAA repair/notes 01/15/2014   Past Surgical History:  Past Surgical History  Procedure Laterality Date  . Pilonidal cyst / sinus excision    . Esophagogastroduodenoscopy    . Colonoscopy w/ biopsies    . Abdominal aortic aneurysm repair N/A 01/14/2014    Procedure: ABDOMINAL AORTIC ANEURYSM WITH BI ILIAC REPAIR; PRIMARY REPAIR OF UMBILICAL HERNIA;  Surgeon: Serafina Mitchell, MD;  Location: [facility];  Service: Vascular;  Laterality: N/A;  . Hernia repair      "belly"  . Cardiac catheterization  06-16-2009    Dr. Johnsie Cancel  . Esophageal dilation      "once"    Assessment & Plan Clinical Impression: :Ruben Blackwell is a 58 y.o. right-handed male multi-medical with history of hypertension, chronic renal insufficiency with baseline creatinine 2.11, tardive dyskinesia as well as Parkinson's disease and schizoaffective disorder. Admitted 01/14/2014 with a known abdominal  aortic aneurysm of 4.7 cm increased to 6.3 cm aneurysm within 6 months. He elected surgical intervention and underwent AAA repair as well as primary repair of umbilical hernia 01/14/2014 per Dr. Myra Gianotti. Postoperative respiratory failure requiring  intubation followed by critical care pulmonary services. Hospital course acute blood loss transfused with latest hemoglobin 12.2. Bouts of hypernatremia166 and IV fluids changed to D5W with latest sodium 143. Close monitoring of chronic renal insufficiency with latest creatinine 2.87. Patient spiked a low-grade fever for 100.5 chest x-ray completed showed persistent right lower lobe infiltrate and Levaquin initiated 01/20/2014 x7 days with followup chest x-ray 01/22/2014 improved. Maintained on subcutaneous Lovenox for DVT prophylaxis. Bouts of loose stool Clostridium difficile specimen negative. Physical and occupational therapy evaluations completed ongoing with recommendations of physical medicine rehabilitation consult. Patient was admitted for comprehensive rehabilitation program     Patient currently requires min A with mobility secondary to decreased coordination.  Prior to hospitalization, patient was Independent with mobility and lived with Alone in a House home.  Home access is 3 stepsStairs to enter.  Patient will benefit from skilled PT intervention to maximize safe functional mobility for planned discharge home with intermittent assist.  Anticipate patient will benefit from follow up [facility] at discharge.  PT - End of Session Activity Tolerance: Tolerates 30+ min activity with multiple rests Endurance Deficit: Yes PT Assessment Rehab Potential: Good Barriers to Discharge: Decreased caregiver support PT Patient demonstrates impairments in the following area(s): Balance;Endurance PT Transfers Functional Problem(s): Bed to Chair PT Locomotion Functional Problem(s): Ambulation;Stairs PT Plan PT Intensity: Minimum of 1-2 x/day ,45 to 90 minutes PT Frequency: 5 out of 7 days PT Duration Estimated Length of Stay: 7-10days PT Treatment/Interventions: Ambulation/gait training;Balance/vestibular training;Neuromuscular re-education;Stair training;Patient/family education;Therapeutic  Exercise;Therapeutic Activities;Functional mobility training PT Transfers Anticipated Outcome(s): Modified Independance PT Locomotion Anticipated Outcome(s): Modified Independance PT Recommendation Recommendations for Other Services: Neuropsych consult Follow Up Recommendations: Home health PT Patient destination: Home Equipment Recommended: To be determined  Skilled Therapeutic Interventions   Session I 0900-1000 (60 min) Patient in bed, bed mobility assessment -CGA for bed mobility. Assisted patient with changing clothes -min A.  Gait training on a distance of 75 feet with HHA and min A. Gait present with short shuffling steps, increased tremors.poor postural control, increase forward lean. Balance assessment -Berg 30/56. Multiple sit to stands and transfer activities ,min A.  Session II 1445-1530 Session focused on w/c propulsion, stairs negotiation 2 x 5 steps with min A and use of B Rails. Gait training 3 x 50 feet with RW with min A.  Tandem stepping and side stepping.    PT Evaluation Precautions/Restrictions Precautions Precautions: Fall Restrictions Weight Bearing Restrictions: No General Chart Reviewed: Yes Family/Caregiver Present: No Vital SignsOxygen Therapy O2 Device: None (Room air) Pain Pain Assessment Pain Assessment: No/denies pain Home Living/Prior Functioning Home Living Available Help at Discharge: Family;Neighbor;Available PRN/intermittently Type of Home: House Home Access: Stairs to enter [employer] of Steps: 3 steps Entrance Stairs-Rails: None Home Layout: One level Additional Comments: Patient was bale to drive and get around in the community   Lives With: Alone Prior Function Level of Independence: Independent with homemaking with ambulation;Independent with basic ADLs;Independent with gait;Independent with transfers  Able to Take Stairs?: Reciprically Driving: Yes Vocation: On disability Leisure: Hobbies-yes (Comment)  (Watching sports on TV,go out to eat) Comments: Patient states he has been getiing "lazy " with his house chores and just likes to go out to eat Vision/Perception  Wears glasses at all times Cognition Overall  Cognitive Status: History of cognitive impairments - at baseline Arousal/Alertness: Awake/alert Orientation Level: Oriented X4 Attention: Focused Focused Attention: Appears intact Memory: Appears intact Awareness: Appears intact Sensation Sensation Light Touch: Appears Intact Stereognosis: Appears Intact Hot/Cold: Appears Intact Proprioception: Appears Intact Coordination Gross Motor Movements are Fluid and Coordinated: Yes Finger Nose Finger Test: able to perform Heel Shin Test: able to perform Motor  Motor Motor: Within Functional Limits  Mobility Bed Mobility Bed Mobility: Rolling Right;Rolling Left Rolling Right: 4: Min guard Rolling Left: 4: Min guard Transfers Transfers: Yes Sit to Stand: 4: Min assist Stand to Sit: 4: Min assist Locomotion  Ambulation Ambulation: Yes Ambulation Distance (Feet): 75 Feet Assistive device: 1 person hand held assist Gait Gait: Yes Gait Pattern: Impaired Gait Pattern: Trunk flexed Gait velocity: Decreased  Trunk/Postural Assessment  Cervical Assessment Cervical Assessment: Within Functional Limits Thoracic Assessment Thoracic Assessment: Within Functional Limits Lumbar Assessment Lumbar Assessment: Within Functional Limits Postural Control Postural Control: Deficits on evaluation  Balance Balance Balance Assessed: Yes Standardized Balance Assessment Standardized Balance Assessment: Berg Balance Test Berg Balance Test Sit to Stand: Able to stand  independently using hands Standing Unsupported: Able to stand 2 minutes with supervision Sitting with Back Unsupported but Feet Supported on Floor or Stool: Able to sit safely and securely 2 minutes Stand to Sit: Controls descent by using hands Transfers: Needs one person  to assist Standing Unsupported with Eyes Closed: Able to stand 10 seconds with supervision Standing Ubsupported with Feet Together: Able to place feet together independently but unable to hold for 30 seconds From Standing, Reach Forward with Outstretched Arm: Reaches forward but needs supervision From Standing Position, Pick up Object from Floor: Able to pick up shoe, needs supervision From Standing Position, Turn to Look Behind Over each Shoulder: Turn sideways only but maintains balance Turn 360 Degrees: Needs close supervision or verbal cueing Standing Unsupported, Alternately Place Feet on Step/Stool: Able to complete 4 steps without aid or supervision Standing Unsupported, One Foot in Front: Able to take small step independently and hold 30 seconds Standing on One Leg: Unable to try or needs assist to prevent fall Total Score: 30 Dynamic Standing Balance Dynamic Standing - Level of Assistance: 4: Min assist Dynamic Standing - Balance Activities: Lateral lean/weight shifting;Reaching for objects;Kicking ball Extremity Assessment  RUE Assessment RUE Assessment: Within Functional Limits LUE Assessment LUE Assessment: Within Functional Limits RLE Assessment RLE Assessment: Within Functional Limits LLE Assessment LLE Assessment: Within Functional Limits  FIM:      Refer to Care Plan for Long Term Goals  Recommendations for other services: Neuropsych  Discharge Criteria: Patient will be discharged from PT if patient refuses treatment 3 consecutive times without medical reason, if treatment goals not met, if there is a change in medical status, if patient makes no progress towards goals or if patient is discharged from hospital.  The above assessment, treatment plan, treatment alternatives and goals were discussed and mutually agreed upon: by patient  Ruben Blackwell 01/23/2014, 10:10 AM

## 2014-01-23 NOTE — Evaluation (Signed)
Occupational Therapy Assessment and Plan And Treatment Session Notes  Patient Details  Name: Ruben Blackwell MRN: [id number] Date of Birth: 1956/06/21  OT Diagnosis: abnormal posture, muscle weakness (generalized) and h/o ataxia and mild cognitive deficits Rehab Potential: Rehab Potential: Good ELOS: 7-10 days   Today's Date: 01/23/2014 Time: 1000-1100 and 215-245 Time Calculation (min): 60 min and 30 min  Problem List:  Patient Active Problem List   Diagnosis Date Noted  . Physical deconditioning 01/22/2014  . Hypernatremia 01/18/2014  . Acute on chronic renal failure 01/15/2014  . Lactic acidosis 01/15/2014  . Acute respiratory failure with hypoxia 01/14/2014  . DOE (dyspnea on exertion) 12/23/2013  . Pulmonary nodule 12/23/2013  . Barrett's esophagus 11/03/2013  . Personal history of colonic polyps-adenomas/ssp 11/03/2013  . Diarrhea 07/30/2013  . Abdominal aneurysm without mention of rupture 06/22/2013  . Nausea with vomiting 04/30/2013  . Unspecified chronic bronchitis 10/09/2012  . Cough 10/09/2012  . HTN (hypertension) 06/05/2012  . Weight loss 08/31/2011  . Bladder neck obstruction 08/31/2011  . Right otitis media 08/31/2011  . Impaired glucose tolerance 08/27/2011  . Preventative health care 08/27/2011  . COPD (chronic obstructive pulmonary disease) with emphysema 08/22/2010  . SKIN LESION 08/22/2010  . CORONARY ARTERY DISEASE 10/20/2009  . BIPOLAR DISORDER UNSPECIFIED 05/25/2009  . PARKINSON'S DISEASE 05/25/2009  . ABNORMAL STRESS ELECTROCARDIOGRAM 04/20/2009  . GERD 12/01/2008  . HYPERTHYROIDISM 08/18/2008  . DEPRESSION 08/18/2008  . SMOKER 09/29/2007  . HYPERLIPIDEMIA 05/20/2007  . 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 TARDIVE DYSKINESIA 05/20/2007  . PILONIDAL CYST, WITH ABSCESS 05/20/2007  . FATIGUE 05/20/2007    Past Medical History:  Past Medical History  Diagnosis Date  . Impaired glucose tolerance 08/27/2011  . BIPOLAR DISORDER UNSPECIFIED  05/25/2009  . COPD 08/22/2010  . CORONARY ARTERY DISEASE 10/20/2009  . HYPERLIPIDEMIA 05/20/2007  . PARKINSON'S DISEASE 05/25/2009  . 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 TARDIVE DYSKINESIA 05/20/2007  . DEPRESSION 08/18/2008  . GERD 12/01/2008  . HYPERTHYROIDISM 08/18/2008  . Unspecified chronic bronchitis 10/09/2012  . Peripheral vascular disease   . Barrett's esophagus 10/26/13    Dx EGD 10/2013  . Personal history of colonic polyps-adenomas/ssp 10/26/13  . Diverticulosis 10/26/13    mild  . Shortness of breath   . Hypertension     Pt reports that Dr. Cathlean Cower placed him on BP meds temporarily but no longer takes.  Marland Kitchen PONV (postoperative nausea and vomiting)     Pt reports nausea only after cardiac cath  . Anxiety   . CKD (chronic kidney disease), stage III   . Cancer     "precancer; my colon and esophagus" (01/15/2014)  . AAA (abdominal aortic aneurysm)   . History of blood transfusion 01/14/2014    related to AAA repair/notes 01/15/2014   Past Surgical History:  Past Surgical History  Procedure Laterality Date  . Pilonidal cyst / sinus excision    . Esophagogastroduodenoscopy    . Colonoscopy w/ biopsies    . Abdominal aortic aneurysm repair N/A 01/14/2014    Procedure: ABDOMINAL AORTIC ANEURYSM WITH BI ILIAC REPAIR; PRIMARY REPAIR OF UMBILICAL HERNIA;  Surgeon: Serafina Mitchell, MD;  Location: [facility];  Service: Vascular;  Laterality: N/A;  . Hernia repair      "belly"  . Cardiac catheterization  06-16-2009    Dr. Johnsie Cancel  . Esophageal dilation      "once"    Assessment & Plan Clinical Impression: Ruben Blackwell is a 58 y.o. right-handed male multi-medical with history of  hypertension, chronic renal insufficiency with baseline creatinine 2.11, part of dyskinesia and schizoaffective disorder.  Admitted 01/14/2014 with a known abdominal aortic aneurysm of 4.7 cm increased to 6.3 cm aneurysm within 6 months. He elected surgical intervention and underwent AAA repair as well as primary  repair of umbilical hernia 63/84/6659 per Dr. Trula Slade. Postoperative respiratory failure requiring intubation followed by critical care pulmonary services. Hospital course acute blood loss anemia 9.0 and monitored.  Patient transferred to CIR on 01/22/2014 .    Patient currently requires min-supervision with basic self-care skills and IADL secondary to muscle weakness and decreased standing balance, decreased postural control and decreased balance strategies.  Prior to hospitalization, patient with decline in health and required assist with HM tasks and states that he at out and rarely bathed because he was too weak.  Patient states that he has had decline in health for approximately 1 year which is why he is so weak and tired all of the time.   Patient will benefit from skilled intervention to increase independence with basic self-care skills prior to discharge at Mod I level for very BADL with family checking in on him daily.  Anticipate patient will require intermittent supervision and follow up home health.  OT - End of Session Activity Tolerance: Tolerates 30+ min activity with multiple rests Endurance Deficit: Yes OT Assessment Rehab Potential: Good OT Patient demonstrates impairments in the following area(s): Balance;Endurance;Motor;Safety OT Basic ADL's Functional Problem(s): Grooming;Bathing;Dressing;Toileting OT Advanced ADL's Functional Problem(s): Light Housekeeping OT Transfers Functional Problem(s): Toilet;Tub/Shower OT Plan OT Intensity: Minimum of 1-2 x/day, 45 to 90 minutes OT Frequency: 5 out of 7 days OT Duration/Estimated Length of Stay: 7-10 days OT Treatment/Interventions: Balance/vestibular training;Community reintegration;Discharge planning;DME/adaptive equipment instruction;Functional mobility training;Patient/family education;Self Care/advanced ADL retraining;Therapeutic Activities;UE/LE Coordination activities OT Basic Self-Care Anticipated Outcome(s): Mod I OT  Toileting Anticipated Outcome(s): Mod I OT Bathroom Transfers Anticipated Outcome(s): Mod I for toileting, Supervision for tub/shower transfer OT Recommendation Patient destination: Home Follow Up Recommendations: Home health OT Equipment Recommended: Tub/shower seat   Skilled Therapeutic Intervention 1)  OT Evaluation and patient declined bath this morning stating he would prefer to complete this when his family brings in more clothes later this morning.  Engaged in simple HM tasks, grooming and toileting.  Focused session on ambulating with HHA in the room to unpack his belongings from other hospital room, ambulate to/from bathroom, toileting, stand at sink to perform oral care and wash hands.  Patient required numerous rest breaks and requesting to drink a lot of water.    2)  Patient resting in w/c upon arrival.  Engaged in sponge bath at sink however declined to bathe full body and declined to change his underware and pants.  Focused session on activity tolerance, dynamic balance, and standing tolerance.  OT Evaluation Precautions/Restrictions  Precautions Precautions: Fall Restrictions Weight Bearing Restrictions: No Pain Denies pain in both sessions [place] expects to be discharged to:: Private residence Living Arrangements: Alone Available Help at Discharge: Family;Neighbor;Available PRN/intermittently Type of Home: House Home Access: Stairs to enter [employer] of Steps: 3 steps Entrance Stairs-Rails: None Home Layout: One level Additional Comments: PTA, patient was able to drive and get around in the community, showered in tub shower combo with curtain and grab bar  Lives With: Alone Prior Function Level of Independence: Needs assistance with homemaking;Independent with basic ADLs;Independent with gait;Independent with transfers (decline in function for almost a year)  Able to Take Stairs?: Reciprically Driving: Yes Vocation: On  disability Leisure:  Hobbies-yes (Comment) (Watching sports on TV,go out to eat) Comments: Patient states he has been feeling bad for about a year and declining in strenght and function.  He states that he has been getiing "lazy " with his house chores, cooking and self care and just likes to go out to eat ADL Overall Min-Supervision, see FIM below for details Vision/Perception  Vision- History Baseline Vision/History: Wears glasses Wears Glasses: At all times  Cognition Overall Cognitive Status: History of cognitive impairments - at baseline Arousal/Alertness: Awake/alert Orientation Level: Oriented X4 Sustained attention impaired (easily distracted) Sensation Sensation Light Touch: Appears Intact Stereognosis: Not tested Hot/Cold: Not tested Proprioception: Not tested Coordination Gross Motor Movements are Fluid and Coordinated: No (BUEs) Fine Motor Movements are Fluid and Coordinated: No (BUEs) Coordination and Movement Description: Patient reports "tremors from years of medication", left worse than right, decreased speed accuracy and rhythm worth FTN, rapid alternating movements and isolated finger movements Motor  Motor Motor: Within Functional Limits Mobility  Bed Mobility Bed Mobility: Rolling Right;Rolling Left Rolling Right: 4: Min guard Rolling Left: 4: Min guard Transfers Sit to Stand: 4: Min assist Stand to Sit: 4: Min assist  Trunk/Postural Assessment  Cervical Assessment Cervical Assessment: Within Functional Limits Thoracic Assessment Thoracic Assessment: Within Functional Limits Lumbar Assessment Lumbar Assessment: Within Functional Limits Postural Control Postural Control: Deficits on evaluation  Balance Standardized Balance Assessment: Berg Balance Test performed by PT Sit to Stand: Able to stand  independently using hands Standing Unsupported: Able to stand 2 minutes with supervision Sitting with Back Unsupported but Feet Supported on Floor or Stool:  Able to sit safely and securely 2 minutes Stand to Sit: Controls descent by using hands Transfers: Needs one person to assist Standing Unsupported with Eyes Closed: Able to stand 10 seconds with supervision Standing Ubsupported with Feet Together: Able to place feet together independently but unable to hold for 30 seconds From Standing, Reach Forward with Outstretched Arm: Reaches forward but needs supervision From Standing Position, Pick up Object from Floor: Able to pick up shoe, needs supervision From Standing Position, Turn to Look Behind Over each Shoulder: Turn sideways only but maintains balance Turn 360 Degrees: Needs close supervision or verbal cueing Standing Unsupported, Alternately Place Feet on Step/Stool: Able to complete 4 steps without aid or supervision Standing Unsupported, One Foot in Front: Able to take small step independently and hold 30 seconds Standing on One Leg: Unable to try or needs assist to prevent fall Total Score: 30 Dynamic Standing Balance Dynamic Standing - Level of Assistance: 4: Min assist Dynamic Standing - Balance Activities: Lateral lean/weight shifting;Reaching for objects;Kicking ball Extremity/Trunk Assessment RUE Assessment RUE Assessment: Within Functional Limits LUE Assessment LUE Assessment: Within Functional Limits  FIM:  FIM - Eating Eating Activity: 6: More than reasonable amount of time FIM - Grooming Grooming Steps: Wash, rinse, dry face;Wash, rinse, dry hands;Oral care, brush teeth, clean dentures;Brush, comb hair Grooming: 5: Set-up assist to obtain items FIM - Bathing Bathing Steps Patient Completed: Chest;Right Arm;Left Arm;Abdomen;Front perineal area;Buttocks Bathing: 4: Min-Patient completes 8-9 [id number] parts or 75+ percent FIM - Upper Body Dressing/Undressing Upper body dressing/undressing steps patient completed: Thread/unthread right sleeve of pullover shirt/dresss;Thread/unthread left sleeve of pullover shirt/dress;Put head  through opening of pull over shirt/dress;Pull shirt over trunk Upper body dressing/undressing: 5: Set-up assist to: Obtain clothing/put away FIM - Lower Body Dressing/Undressing Lower body dressing/undressing steps patient completed: Don/Doff right sock;Don/Doff left sock;Don/Doff right shoe;Don/Doff left shoe;Fasten/unfasten right shoe;Fasten/unfasten left shoe (declined to remove/change pants) Lower  body dressing/undressing: 4: Steadying Assist FIM - Toileting Toileting steps completed by patient: Adjust clothing prior to toileting;Performs perineal hygiene;Adjust clothing after toileting Toileting Assistive Devices: Grab bar or rail for support Toileting: 4: Assist with fasteners FIM - IT sales professional Transfer: 4: Chair or W/C > Bed: Min A (steadying Pt. > 75%);4: Bed > Chair or W/C: Min A (steadying Pt. > 75%) FIM - Toilet Transfers Toilet Transfers: 4-From toilet/BSC: Min A (steadying Pt. > 75%)   Refer to Care Plan for Long Term Goals  Recommendations for other services: None  Discharge Criteria: Patient will be discharged from OT if patient refuses treatment 3 consecutive times without medical reason, if treatment goals not met, if there is a change in medical status, if patient makes no progress towards goals or if patient is discharged from hospital.  The above assessment, treatment plan, treatment alternatives and goals were discussed and mutually agreed upon: by patient  Enzio Buchler 01/23/2014, 12:51 PM

## 2014-01-23 NOTE — Progress Notes (Signed)
Subjective/Complaints: 58 y.o. right-handed male multi-medical with history of hypertension, chronic renal insufficiency with baseline creatinine 2.11, tardive dyskinesia as well as Parkinson's disease and schizoaffective disorder. Admitted 01/14/2014 with a known abdominal aortic aneurysm of 4.7 cm increased to 6.3 cm aneurysm within 6 months. He elected surgical intervention and underwent AAA repair as well as primary repair of umbilical hernia 60/63/0160 per Dr. Trula Slade. Postoperative respiratory failure requiring intubation followed by critical care pulmonary services. Hospital course acute blood loss transfused with latest hemoglobin 12.2. Bouts of hypernatremia166 and IV fluids changed to D5W with latest sodium 143. Close monitoring of chronic renal insufficiency with latest creatinine 2.87. Patient spiked a low-grade fever for 100.5 chest x-ray completed showed persistent right lower lobe infiltrate and Levaquin initiated 01/20/2014 x7 days with followup chest x-ray 01/22/2014 improved   Objective: Vital Signs: Blood pressure 130/80, pulse 74, temperature 98.3 F (36.8 C), temperature source Oral, resp. rate 19, height 5' 8.5" (1.74 m), weight 70.1 kg (154 lb 8.7 oz), SpO2 97.00%. X-ray Chest Pa Or Ap  01/22/2014   CLINICAL DATA:  Follow-up right lower lobe infiltrate  EXAM: CHEST - 1 VIEW  COMPARISON:  01/20/2014  FINDINGS: Cardiomediastinal silhouette is stable. Left lung is clear. Residual patchy infiltrate in right lower lobe. Probable small right pleural effusion. No pulmonary edema.  IMPRESSION: Residual patchy infiltrate in right lower lobe with slight improved aeration. Probable small right pleural effusion. Left lung is clear. No pulmonary edema. Follow-up to resolution recommended.   Electronically Signed   By: Lahoma Crocker M.D.   On: 01/22/2014 08:32   Results for orders placed during the hospital encounter of 01/22/14 (from the past 72 hour(s))  CBC     Status: Abnormal   Collection  Time    01/22/14  8:20 PM      Result Value Ref Range   WBC 14.2 (*) 4.0 - 10.5 K/uL   RBC 3.83 (*) 4.22 - 5.81 MIL/uL   Hemoglobin 12.5 (*) 13.0 - 17.0 g/dL   HCT 35.9 (*) 39.0 - 52.0 %   MCV 93.7  78.0 - 100.0 fL   MCH 32.6  26.0 - 34.0 pg   MCHC 34.8  30.0 - 36.0 g/dL   RDW 15.4  11.5 - 15.5 %   Platelets 360  150 - 400 K/uL  CREATININE, SERUM     Status: Abnormal   Collection Time    01/22/14  8:20 PM      Result Value Ref Range   Creatinine, Ser 2.23 (*) 0.50 - 1.35 mg/dL   GFR calc non Af Amer 31 (*) >90 mL/min   GFR calc Af Amer 36 (*) >90 mL/min   Comment: (NOTE)     The eGFR has been calculated using the CKD EPI equation.     This calculation has not been validated in all clinical situations.     eGFR's persistently <90 mL/min signify possible Chronic Kidney     Disease.      Physical Exam   Constitutional: He appears well-developed.   HENT:   Head: Normocephalic.   Eyes: EOM are normal.   Neck: Normal range of motion. Neck supple. No thyromegaly present.   Cardiovascular: Normal rate and regular rhythm.   Respiratory: Effort normal and breath sounds normal. No respiratory distress.   GI: Soft. Bowel sounds are normal. He exhibits no distension.  Neurological: He is alert.  Tremors all 4 limbs, low frequency. Patient provided his name, age, date of birth and place. Follows simple  commands. UE grossly 4/5 prox to distally. LE 3/5 HF, KE and 4/5 distally. Sensory function intact. Decreased [place]. A bit distracted. Needed redirection to stay on task  Skin:  Surgical site clean and dry  Psychiatric:  No agitation   Assessment/Plan: 1. Functional deficits secondary to Deconditioning which require 3+ hours per day of interdisciplinary therapy in a comprehensive inpatient rehab setting. Physiatrist is providing close team supervision and 24 hour management of active medical problems listed below. Physiatrist and rehab team continue to assess barriers to  discharge/monitor patient progress toward functional and medical goals. FIM:                   Comprehension Comprehension Mode: Auditory Comprehension: 3-Understands basic 50 - 74% of the time/requires cueing 25 - 50%  of the time  Expression Expression Mode: Verbal Expression: 4-Expresses basic 75 - 89% of the time/requires cueing 10 - 24% of the time. Needs helper to occlude trach/needs to repeat words.  Social Interaction Social Interaction: 4-Interacts appropriately 75 - 89% of the time - Needs redirection for appropriate language or to initiate interaction.  Problem Solving Problem Solving: 4-Solves basic 75 - 89% of the time/requires cueing 10 - 24% of the time  Memory Memory: 6-More than reasonable amt of time  Medical Problem List and Plan:  1. Functional deficits secondary to deconditioning related to AAA repair 01/14/2014 /multiple medical issues  2. DVT Prophylaxis/Anticoagulation: Lovenox. Monitor platelet counts any signs of bleeding  3. Pain Management: Tylenol as needed  4. Mood/schizoaffective disorder: Continue Depakote 1000 mg each bedtime, Prozac 20 mg daily, Haldol 2 mg each bedtime  5. Neuropsych: This patient is capable of making decisions on his own behalf.  6. Acute blood loss anemia. Latest hemoglobin improved 12.2. Followup CBC  7. Chronic renal insufficiency. Baseline creatinine 2.11. Followup chemistries  8. COPD/tobacco abuse/RLL infiltrate. Levaquin initiated 01/20/2014 x7 days. Continue nebulizer as needed, NicoDerm patch. Provide counseling. Resume Spiriva and Dulera as prior to admission  9. Hyperlipidemia. Zocor  10.GERD. Prilosec  11.Hypernatremia .resolved with latest sodium 143. Followup chemistries    LOS (Days) 1 A FACE TO FACE EVALUATION WAS PERFORMED  Natallia Stellmach E 01/23/2014, 9:08 AM

## 2014-01-24 ENCOUNTER — Inpatient Hospital Stay (HOSPITAL_COMMUNITY): Payer: Medicare Other | Admitting: Physical Therapy

## 2014-01-24 NOTE — IPOC Note (Signed)
Overall Plan of Care [facility]) Patient Details Name: Ruben Blackwell MRN: [id number] DOB: 1955/09/30  Admitting Diagnosis: AAA REPAIR  DECONDITIONED   Hospital Problems: Active Problems:   Physical deconditioning     Functional Problem List: Nursing Behavior;Bladder;Bowel;Nutrition;Pain;Safety  PT Balance;Endurance  OT Balance;Endurance;Motor;Safety  SLP    TR         Basic ADL's: OT Grooming;Bathing;Dressing;Toileting     Advanced  ADL's: OT Light Housekeeping     Transfers: PT Bed to Chair  OT Toilet;Tub/Shower     Locomotion: PT Ambulation;Stairs     Additional Impairments: OT    SLP        TR      Anticipated Outcomes Item Anticipated Outcome  Self Feeding    Swallowing      Basic self-care  Mod I  Toileting  Mod I   Bathroom Transfers Mod I for toileting, Supervision for tub/shower transfer  Bowel/Bladder  Patient will use urinal and call for assistance with toileting, be continent  Transfers  Modified Independance  Locomotion  Modified Independance  Communication     Cognition     Pain  Patient's pain will be managed and brought to a goal of 2/10.  Safety/Judgment  Patient will be aware of safety, calling for assistance as needed.   Therapy Plan: PT Intensity: Minimum of 1-2 x/day ,45 to 90 minutes PT Frequency: 5 out of 7 days PT Duration Estimated Length of Stay: 7-10days OT Intensity: Minimum of 1-2 x/day, 45 to 90 minutes OT Frequency: 5 out of 7 days OT Duration/Estimated Length of Stay: 7-10 days         Team Interventions: Nursing Interventions Patient/Family Education;Bladder Management;Bowel Management;Skin Care/Wound Management;Pain Management  PT interventions Ambulation/gait training;Balance/vestibular training;Neuromuscular re-education;Stair training;Patient/family education;Therapeutic Exercise;Therapeutic Activities;Functional mobility training  OT Interventions Balance/vestibular training;Community  reintegration;Discharge planning;DME/adaptive equipment instruction;Functional mobility training;Patient/family education;Self Care/advanced ADL retraining;Therapeutic Activities;UE/LE Coordination activities  SLP Interventions    TR Interventions    SW/CM Interventions      Team Discharge Planning: Destination: PT-Home ,OT- Home , SLP-  Projected Follow-up: PT-Home health PT, OT-  Home health OT, SLP-  Projected Equipment Needs: PT-To be determined, OT- Tub/shower seat, SLP-  Equipment Details: PT- , OT-  Patient/family involved in discharge planning: PT- Patient,  OT-Patient, SLP-   MD ELOS: 7-10d Medical Rehab Prognosis:  Good Assessment: 58 y.o. right-handed male multi-medical with history of hypertension, chronic renal insufficiency with baseline creatinine 2.11, tardive dyskinesia as well as Parkinson's disease and schizoaffective disorder. Admitted 01/14/2014 with a known abdominal aortic aneurysm of 4.7 cm increased to 6.3 cm aneurysm within 6 months. He elected surgical intervention and underwent AAA repair as well as primary repair of umbilical hernia 59/93/5701 per Dr. Trula Slade. Postoperative respiratory failure requiring intubation followed by critical care pulmonary services. Hospital course acute blood loss transfused with latest hemoglobin 12.2.   Now requiring 24/7 Rehab RN,MD, as well as CIR level PT, OT and SLP.  Treatment team will focus on ADLs and mobility with goals set at Mod I   See Team Conference Notes for weekly updates to the plan of care

## 2014-01-24 NOTE — Progress Notes (Signed)
Subjective/Complaints: 58 y.o. right-handed male multi-medical with history of hypertension, chronic renal insufficiency with baseline creatinine 2.11, tardive dyskinesia as well as Parkinson's disease and schizoaffective disorder. Admitted 01/14/2014 with a known abdominal aortic aneurysm of 4.7 cm increased to 6.3 cm aneurysm within 6 months. He elected surgical intervention and underwent AAA repair as well as primary repair of umbilical hernia 79/09/4095 per Dr. Trula Slade. Postoperative respiratory failure requiring intubation followed by critical care pulmonary services. Hospital course acute blood loss transfused with latest hemoglobin 12.2. Bouts of hypernatremia166 and IV fluids changed to D5W with latest sodium 143. Close monitoring of chronic renal insufficiency with latest creatinine 2.87. Patient spiked a low-grade fever for 100.5 chest x-ray completed showed persistent right lower lobe infiltrate and Levaquin initiated 01/20/2014 x7 days with followup chest x-ray 01/22/2014 improved  No abd pain Slept okl Jacqualin Combes of therapy schedule  Limited ROS pt sleeing, no SOB no pain c/os Objective: Vital Signs: Blood pressure 130/86, pulse 91, temperature 98.5 F (36.9 C), temperature source Oral, resp. rate 16, height 5' 8.5" (1.74 m), weight 70.1 kg (154 lb 8.7 oz), SpO2 98.00%. No results found. Results for orders placed during the hospital encounter of 01/22/14 (from the past 72 hour(s))  CBC     Status: Abnormal   Collection Time    01/22/14  8:20 PM      Result Value Ref Range   WBC 14.2 (*) 4.0 - 10.5 K/uL   RBC 3.83 (*) 4.22 - 5.81 MIL/uL   Hemoglobin 12.5 (*) 13.0 - 17.0 g/dL   HCT 35.9 (*) 39.0 - 52.0 %   MCV 93.7  78.0 - 100.0 fL   MCH 32.6  26.0 - 34.0 pg   MCHC 34.8  30.0 - 36.0 g/dL   RDW 15.4  11.5 - 15.5 %   Platelets 360  150 - 400 K/uL  CREATININE, SERUM     Status: Abnormal   Collection Time    01/22/14  8:20 PM      Result Value Ref Range   Creatinine, Ser 2.23 (*)  0.50 - 1.35 mg/dL   GFR calc non Af Amer 31 (*) >90 mL/min   GFR calc Af Amer 36 (*) >90 mL/min   Comment: (NOTE)     The eGFR has been calculated using the CKD EPI equation.     This calculation has not been validated in all clinical situations.     eGFR's persistently <90 mL/min signify possible Chronic Kidney     Disease.      Physical Exam   Constitutional: He appears well-developed.   HENT:   Head: Normocephalic.   Eyes: EOM are normal.   Neck: Normal range of motion. Neck supple. No thyromegaly present.   Cardiovascular: Normal rate and regular rhythm.   Respiratory: Effort normal and breath sounds normal. No respiratory distress.   GI: Soft. Bowel sounds are normal. He exhibits no distension.  Neurological: He is alert.  Tremors all 4 limbs, low frequency. Patient provided his name, age, date of birth and place. Follows simple commands. UE grossly 4/5 prox to distally. LE 3/5 HF, KE and 4/5 distally. Sensory function intact. Decreased Jacobus. A bit distracted. Needed redirection to stay on task  Skin:  Surgical site clean and dry  Psychiatric:  No agitation   Assessment/Plan: 1. Functional deficits secondary to Deconditioning which require 3+ hours per day of interdisciplinary therapy in a comprehensive inpatient rehab setting. Physiatrist is providing close team supervision and 24 hour management of active medical  problems listed below. Physiatrist and rehab team continue to assess barriers to discharge/monitor patient progress toward functional and medical goals. FIM: FIM - Bathing Bathing Steps Patient Completed: Chest;Right Arm;Left Arm;Abdomen;Front perineal area;Buttocks Bathing: 4: Min-Patient completes 8-9 25f10 parts or 75+ percent  FIM - Upper Body Dressing/Undressing Upper body dressing/undressing steps patient completed: Thread/unthread right sleeve of pullover shirt/dresss;Thread/unthread left sleeve of pullover shirt/dress;Put head through opening of pull over  shirt/dress;Pull shirt over trunk Upper body dressing/undressing: 5: Set-up assist to: Obtain clothing/put away FIM - Lower Body Dressing/Undressing Lower body dressing/undressing steps patient completed: Don/Doff right sock;Don/Doff left sock;Don/Doff right shoe;Don/Doff left shoe;Fasten/unfasten right shoe;Fasten/unfasten left shoe (declined to remove/change pants) Lower body dressing/undressing: 4: Steadying Assist  FIM - Toileting Toileting steps completed by patient: Adjust clothing prior to toileting;Performs perineal hygiene;Adjust clothing after toileting Toileting Assistive Devices: Grab bar or rail for support Toileting: 4: Assist with fasteners  FIM - Toilet Transfers Toilet Transfers: 4-From toilet/BSC: Min A (steadying Pt. > 75%)  FIM - Bed/Chair Transfer Bed/Chair Transfer Assistive Devices: Arm rests Bed/Chair Transfer: 4: Chair or W/C > Bed: Min A (steadying Pt. > 75%);4: Bed > Chair or W/C: Min A (steadying Pt. > 75%)  FIM - Locomotion: Wheelchair Locomotion: Wheelchair: 2: Travels 547- 149 ft with supervision, cueing or coaxing FIM - Locomotion: Ambulation Locomotion: Ambulation Assistive Devices: WAdministratorAmbulation/Gait Assistance: 4: Min assist Locomotion: Ambulation: 2: Travels 50 - 149 ft with minimal assistance (Pt.>75%)  Comprehension Comprehension Mode: Auditory Comprehension: 5-Follows basic conversation/direction: With extra time/assistive device  Expression Expression Mode: Verbal Expression: 4-Expresses basic 75 - 89% of the time/requires cueing 10 - 24% of the time. Needs helper to occlude trach/needs to repeat words.  Social Interaction Social Interaction: 6-Interacts appropriately with others with medication or extra time (anti-anxiety, antidepressant).  Problem Solving Problem Solving: 5-Solves basic 90% of the time/requires cueing < 10% of the time  Memory Memory: 5-Recognizes or recalls 90% of the time/requires cueing < 10% of the  time  Medical Problem List and Plan:  1. Functional deficits secondary to deconditioning related to AAA repair 01/14/2014 /multiple medical issues  2. DVT Prophylaxis/Anticoagulation: Lovenox. Monitor platelet counts any signs of bleeding  3. Pain Management: Tylenol as needed  4. Mood/schizoaffective disorder: Continue Depakote 1000 mg each bedtime, Prozac 20 mg daily, Haldol 2 mg each bedtime  5. Neuropsych: This patient is capable of making decisions on his own behalf.  6. Acute blood loss anemia. Latest hemoglobin improved 12.2. Followup CBC  7. Chronic renal insufficiency. Baseline creatinine 2.11. Followup chemistries  8. COPD/tobacco abuse/RLL infiltrate. Levaquin initiated 01/20/2014 x7 days. Continue nebulizer as needed, NicoDerm patch. Provide counseling. Resume Spiriva and Dulera as prior to admission  9. Hyperlipidemia. Zocor  10.GERD. Prilosec  11.Hypernatremia .resolved with latest sodium 143. Followup chemistries    LOS (Days) 2 A FACE TO FACE EVALUATION WAS PERFORMED  Brnadon Eoff E 01/24/2014, 7:20 AM

## 2014-01-24 NOTE — Progress Notes (Signed)
Physical Therapy Session Note  Patient Details  Name: Ruben Blackwell MRN: [id number] Date of Birth: 1956-02-17  Today's Date: 01/24/2014 Time: 7408-1448 Time Calculation (min): 45 min  Short Term Goals: Week 1:  PT Short Term Goal 1 (Week 1): STG=LTG  Skilled Therapeutic Interventions/Progress Updates:   Session focused on activity tolerance with functional mobility. Pt received sitting in w/c with QRB on, requesting bathroom. Patient performed UB and LB dressing from w/c level > standing before ambulating to bathroom and sink to wash hands with min HHA. W/c propulsion using alt BUE/LE x 100 ft with S for endurance. Gait training x 180 ft with no AD and close S-min guard. Stair negotiation up/down 5 stairs x 2 using 1 rail and up/down 3 stairs without rail and close S-min guard, reciprocal pattern ascending and step-to pattern descending. In ADL apartment patient removed comforter/pillows from bed, changed out pillow cases, and re-made bed with no LOB and supervision. Pt required multiple seated rest between tasks due to fatigue and SOB. Patient returned to room in w/c due to fatigue and left sitting in w/c with QRB on and all needs within reach.    Therapy Documentation Precautions:  Precautions Precautions: Fall Restrictions Weight Bearing Restrictions: No Pain:  Denied pain  See FIM for current functional status  Therapy/Group: Individual Therapy  Laretta Alstrom 01/24/2014, 12:47 PM

## 2014-01-24 NOTE — H&P (Signed)
Physical Medicine and Rehabilitation Admission H&P  Ruben Blackwell is an 58 y.o. male.   HPI: 58 y.o. right-handed male multi-medical with history of hypertension, chronic renal insufficiency with baseline creatinine 2.11, tardive dyskinesia as well as Parkinson's disease and schizoaffective disorder. Admitted 01/14/2014 with a known abdominal aortic aneurysm of 4.7 cm increased to 6.3 cm aneurysm within 6 months. He elected surgical intervention and underwent AAA repair as well as primary repair of umbilical hernia 40/76/8088 per Dr. Trula Slade. Postoperative respiratory failure requiring intubation followed by critical care pulmonary services. Hospital course acute blood loss transfused with latest hemoglobin 12.2. Bouts of hypernatremia166 and IV fluids changed to D5W with latest sodium 143. Close monitoring of chronic renal insufficiency with latest creatinine 2.87. Patient spiked a low-grade fever for 100.5 chest x-ray completed showed persistent right lower lobe infiltrate and Levaquin initiated 01/20/2014 x7 days with followup chest x-ray 01/22/2014 improved   Review of Systems  Constitutional: Negative.   HENT: Negative.   Eyes: Negative.   Respiratory: Negative.   Cardiovascular: Negative.   Gastrointestinal: Positive for abdominal pain.  Genitourinary: Negative.   Musculoskeletal: Positive for myalgias.  Neurological: Positive for tremors.  Endo/Heme/Allergies: Negative.   Psychiatric/Behavioral: The patient is nervous/anxious.    Past Medical History  Diagnosis Date  . Impaired glucose tolerance 08/27/2011  . BIPOLAR DISORDER UNSPECIFIED 05/25/2009  . COPD 08/22/2010  . CORONARY ARTERY DISEASE 10/20/2009  . HYPERLIPIDEMIA 05/20/2007  . PARKINSON'S DISEASE 05/25/2009  . 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 TARDIVE DYSKINESIA 05/20/2007  . DEPRESSION 08/18/2008  . GERD 12/01/2008  . HYPERTHYROIDISM 08/18/2008  . Unspecified chronic bronchitis 10/09/2012  . Peripheral vascular disease    . Barrett's esophagus 10/26/13    Dx EGD 10/2013  . Personal history of colonic polyps-adenomas/ssp 10/26/13  . Diverticulosis 10/26/13    mild  . Shortness of breath   . Hypertension     Pt reports that Dr. Cathlean Cower placed him on BP meds temporarily but no longer takes.  Marland Kitchen PONV (postoperative nausea and vomiting)     Pt reports nausea only after cardiac cath  . Anxiety   . CKD (chronic kidney disease), stage III   . Cancer     "precancer; my colon and esophagus" (01/15/2014)  . AAA (abdominal aortic aneurysm)   . History of blood transfusion 01/14/2014    related to AAA repair/notes 01/15/2014   Past Surgical History  Procedure Laterality Date  . Pilonidal cyst / sinus excision    . Esophagogastroduodenoscopy    . Colonoscopy w/ biopsies    . Abdominal aortic aneurysm repair N/A 01/14/2014    Procedure: ABDOMINAL AORTIC ANEURYSM WITH BI ILIAC REPAIR; PRIMARY REPAIR OF UMBILICAL HERNIA;  Surgeon: Serafina Mitchell, MD;  Location: [facility];  Service: Vascular;  Laterality: N/A;  . Hernia repair      "belly"  . Cardiac catheterization  06-16-2009    Dr. Johnsie Cancel  . Esophageal dilation      "once"   Family History  Problem Relation Age of Onset  . Hyperlipidemia Mother   . Hypertension Mother   . Varicose Veins Mother   . Diabetes Father   . Heart disease Father     before age 53  . Heart attack Father   . Cancer Sister   . Hyperlipidemia Sister   . Hypertension Sister   . Cancer Brother   . Diabetes Brother   . Hyperlipidemia Brother   . Hypertension Brother    Social History:  reports that he  has been smoking Cigarettes.  He has a 100 pack-year smoking history. He has never used smokeless tobacco. He reports that he drinks alcohol. He reports that he does not use illicit drugs. Allergies:  Allergies  Allergen Reactions  . Sulfonamide Derivatives     REACTION: Reaction not known   Medications Prior to Admission  Medication Sig Dispense Refill  . divalproex (DEPAKOTE) 500 MG DR  tablet Take 1,000 mg by mouth at bedtime.      Marland Kitchen FLUoxetine (PROZAC) 20 MG capsule Take 20 mg by mouth at bedtime.      . haloperidol (HALDOL) 2 MG tablet Take 2 mg by mouth at bedtime.      Marland Kitchen levofloxacin (LEVAQUIN) 750 MG tablet Take 1 tablet (750 mg total) by mouth every other day.  5 tablet  0  . mometasone-formoterol (DULERA) 100-5 MCG/ACT AERO Inhale 2 puffs into the lungs 2 (two) times daily.      . nicotine (NICODERM CQ - DOSED IN MG/24 HOURS) 21 mg/24hr patch Place 21 mg onto the skin daily.      Marland Kitchen omeprazole (PRILOSEC) 40 MG capsule Take 1 capsule (40 mg total) by mouth daily before supper.  30 capsule  11  . oxyCODONE (ROXICODONE) 5 MG immediate release tablet Take 1 tablet (5 mg total) by mouth every 6 (six) hours as needed for severe pain.  30 tablet  0  . simvastatin (ZOCOR) 40 MG tablet Take 40 mg by mouth at bedtime.      Marland Kitchen tiotropium (SPIRIVA) 18 MCG inhalation capsule Place 18 mcg into inhaler and inhale daily.        Home: Home Living Family/patient expects to be discharged to:: Private residence Living Arrangements: Alone Available Help at Discharge: Family;Neighbor;Available PRN/intermittently Type of Home: House Home Access: Stairs to enter [employer] of Steps: 3 steps Entrance Stairs-Rails: None Home Layout: One level Additional Comments: PTA, patient was able to drive and get around in the community, showered in tub shower combo with curtain and grab bar  Lives With: Alone   Functional History: Prior Function Comments: Patient states he has been feeling bad for about a year and declining in strenght and function.  He states that he has been getiing "lazy " with his house chores, cooking and self care and just likes to go out to eat  Functional Status:  Mobility:     Ambulation/Gait Ambulation Distance (Feet): 75 Feet Gait velocity: Decreased Wheelchair Mobility Distance: 100  ADL:    Cognition: Cognition Overall Cognitive Status: History  of cognitive impairments - at baseline Arousal/Alertness: Awake/alert Orientation Level: Oriented X4 Attention: Sustained Focused Attention: Appears intact Sustained Attention: Impaired Sustained Attention Impairment: Functional basic;Verbal basic Memory: Appears intact Awareness: Appears intact Cognition Overall Cognitive Status: History of cognitive impairments - at baseline   Blood pressure 143/86, pulse 86, temperature 99.6 F (37.6 C), temperature source Oral, resp. rate 17, height 5' 8.5" (1.74 m), weight 70.1 kg (154 lb 8.7 oz), SpO2 96.00%. Physical Exam Physical Exam   Constitutional: He appears well-developed.   HENT:   Head: Normocephalic.   Eyes: EOM are normal.   Neck: Normal range of motion. Neck supple. No thyromegaly present.   Cardiovascular: Normal rate and regular rhythm.   Respiratory: Effort normal and breath sounds normal. No respiratory distress.   GI: Soft. Bowel sounds are normal. He exhibits no distension.  Neurological: He is alert.  Tremors all 4 limbs, low frequency. Patient provided his name, age, date of birth and place.  Follows simple commands. UE grossly 4/5 prox to distally. LE 3/5 HF, KE and 4/5 distally. Sensory function intact. Decreased [place]. A bit distracted. Needed redirection to stay on task  Skin:  Surgical site clean and dry  Psychiatric:  A bit flat, disconnected    No results found for this or any previous visit (from the past 48 hour(s)). No results found.  Assessment/Plan: 1. Functional deficits secondary  to Deconditioning s/p AAA repair. 2. Patient admitted to receive collaborative, interdisciplinary care between the psychiatrist, rehab nursing staff, and therapy team. 3. Patient's level of medical complexity and substantial therapy needs in context of that medical necessity cannot be provided at a lesser intensity of care. 4. Patient has experienced substantial functional loss form his/her baseline.Judging by the patient's  diagnosis, physical exam, and functional history, the patient has the potential for functional progress OR the patient as displayed the ability to make functional progress which will result in measurable gains while on inpatient rehab.  These gains will be of substantial and practical use upon discharge in facilitating mobility and self-care at the household level. 5. Psychiatrist will provide 24 hour management of medical needs as well as oversight of the therapy plan/treatment and provide guidance as appropriate regarding the interaction of the two. 6. 24 hour rehab nursing will assist in the management of skin, bowel, bladder, patient education, pain management nursing interventions and help integrate therapy concepts, techniques,education, etc. 7. PT will assess and treat for: . pre gait, gait training, endurance , safety, equipment, neuromuscular re education Goals are Mod I. 8. OT will assess and treat for .ADLs, Cognitive perceptual skills, Neuromuscular re education, safety, endurance, equipment  Goals are: Mod I.  9. SLP will assess and treat for .  Goals are: NA. 10. Case Management and Social Worker will assess and treat for psychological issues and discharge planning. 11. Team conference will be held weekly to establish goals to assess progress and todetermine barriers to discharge. 12.  Patient will receive at least 3 hours of therapy per day at least 5 days per week. 13. ELOS and Prognosis: 7-10 days    PT: ROM, Strengthening, Bed Modility, Transfers, Pre Gait Training, Personnel officer,  Equipment  OT: ROM, Strengthening, ADL's, Cognitive/Perceptual Training, Splinting, Equipment  Rehab RN: Skin Care, Wound Care, Bowel & Bladder Training  Case Management: Assess Home Environment, Assist with Discharge Planning, Arrange for Appropriate Follow up Care  SW: Assess Family/Social Support, Counsel patient & Family regarding Disability issues, Assist Discharge  Planning  SLP:N/A  Neuropsych:Evaluate mood  Medical Problem List and Plan:   1. Functional deficits secondary to deconditioning related to AAA repair 01/14/2014 /multiple medical issues   2. DVT Prophylaxis/Anticoagulation: Lovenox. Monitor platelet counts any signs of bleeding   3. Pain Management: Tylenol as needed   4. Mood/schizoaffective disorder: Continue Depakote 1000 mg each bedtime, Prozac 20 mg daily, Haldol 2 mg each bedtime   5. Neuropsych: This patient is capable of making decisions on his own behalf.   6. Acute blood loss anemia. Latest hemoglobin improved 12.2. Followup CBC   7. Chronic renal insufficiency. Baseline creatinine 2.11. Followup chemistries   8. COPD/tobacco abuse/RLL infiltrate. Levaquin initiated 01/20/2014 x7 days. Continue nebulizer as needed, NicoDerm patch. Provide counseling. Resume Spiriva and Dulera as prior to admission   9. Hyperlipidemia. Zocor   10.GERD. Prilosec   11.Hypernatremia .resolved with latest sodium 143. Followup chemistries      KIRSTEINS,ANDREW E 01/24/2014, 9:34 PM

## 2014-01-25 ENCOUNTER — Inpatient Hospital Stay (HOSPITAL_COMMUNITY): Payer: Medicare Other | Admitting: Physical Therapy

## 2014-01-25 ENCOUNTER — Encounter (HOSPITAL_COMMUNITY): Payer: Medicare Other | Admitting: Occupational Therapy

## 2014-01-25 ENCOUNTER — Inpatient Hospital Stay (HOSPITAL_COMMUNITY): Payer: Medicare Other | Admitting: Occupational Therapy

## 2014-01-25 DIAGNOSIS — R5381 Other malaise: Secondary | ICD-10-CM

## 2014-01-25 LAB — COMPREHENSIVE METABOLIC PANEL
ALBUMIN: 2.6 g/dL — AB (ref 3.5–5.2)
ALK PHOS: 71 U/L (ref 39–117)
ALT: 19 U/L (ref 0–53)
AST: 29 U/L (ref 0–37)
BUN: 44 mg/dL — AB (ref 6–23)
CALCIUM: 9.4 mg/dL (ref 8.4–10.5)
CO2: 24 mEq/L (ref 19–32)
CREATININE: 2.25 mg/dL — AB (ref 0.50–1.35)
Chloride: 101 mEq/L (ref 96–112)
GFR calc non Af Amer: 31 mL/min — ABNORMAL LOW (ref >90)
GFR, EST AFRICAN AMERICAN: 36 mL/min — AB (ref >90)
GLUCOSE: 106 mg/dL — AB (ref 70–99)
Potassium: 5.2 mEq/L (ref 3.7–5.3)
Sodium: 140 mEq/L (ref 137–147)
TOTAL PROTEIN: 7.7 g/dL (ref 6.0–8.3)
Total Bilirubin: 0.3 mg/dL (ref 0.3–1.2)

## 2014-01-25 LAB — CBC WITH DIFFERENTIAL/PLATELET
BASOS ABS: 0 10*3/uL (ref 0.0–0.1)
BASOS PCT: 0 % (ref 0–1)
Band Neutrophils: 0 % (ref 0–10)
Blasts: 0 %
EOS ABS: 0.3 10*3/uL (ref 0.0–0.7)
EOS PCT: 2 % (ref 0–5)
HCT: 34.9 % — ABNORMAL LOW (ref 39.0–52.0)
HEMOGLOBIN: 12 g/dL — AB (ref 13.0–17.0)
LYMPHS ABS: 2.8 10*3/uL (ref 0.7–4.0)
Lymphocytes Relative: 21 % (ref 12–46)
MCH: 33 pg (ref 26.0–34.0)
MCHC: 34.4 g/dL (ref 30.0–36.0)
MCV: 95.9 fL (ref 78.0–100.0)
MYELOCYTES: 0 %
Metamyelocytes Relative: 0 %
Monocytes Absolute: 2.7 10*3/uL — ABNORMAL HIGH (ref 0.1–1.0)
Monocytes Relative: 20 % — ABNORMAL HIGH (ref 3–12)
NRBC: 0 /100{WBCs}
Neutro Abs: 7.6 10*3/uL (ref 1.7–7.7)
Neutrophils Relative %: 57 % (ref 43–77)
Platelets: 482 10*3/uL — ABNORMAL HIGH (ref 150–400)
Promyelocytes Absolute: 0 %
RBC: 3.64 MIL/uL — ABNORMAL LOW (ref 4.22–5.81)
RDW: 14.6 % (ref 11.5–15.5)
WBC: 13.4 10*3/uL — AB (ref 4.0–10.5)

## 2014-01-25 NOTE — Progress Notes (Signed)
Physical Therapy Session Note  Patient Details  Name: Ruben Blackwell MRN: [id number] Date of Birth: 10-Jan-1956  Today's Date: 01/25/2014 Time: 9373-4287 and 1129-1204 Time Calculation (min): 60 min and 35 min  Short Term Goals: Week 1:  PT Short Term Goal 1 (Week 1): STG=LTG  Skilled Therapeutic Interventions/Progress Updates:    Treatment Session 1: Pt received semi-reclined in bed; agreeable to therapy. Noted that pt brief and bed were soiled due to episode of urinary incontinence during sleep. Performed multiple sit<>stands from w/c with min A and cueing for hand placement with effective within-session carryover. Multiple dynamic standing trials (x30-60 seconds each during hygiene and clothing change) with RUE support. W/c mobility x120' in controlled environment with bilat UE support and supervision, increased time. Performed gait x55', x65' (seated rest break between trials) with min A, R HHA. Multiple prolonged (3-5 minute) rest breaks required between trials secondary to significant pt faitgue. Session ended in pt room, where pt was left seated in w/c with quick release belt on for safety (per pt request) and all needs within reach.  Treatment Session 2: Pt received seated in w/c; agreeable to therapy. Dynamic standing balance x2 minutes (during hand washing) with intermittent LUE support at sink, supervision, cueing for safe positioning at sink to increase postural stability. Performed gait 2x150' in controlled and home environments with min guard, no HHA; no overt LOB. Following seated rest break, negotiated 3 stairs x3 consecutive trials, forward-facing without rails (to simulate primary of home) using SPC with step-to pattern and min guard-min A. Tactile/verbal cueing required for initial 2 trials with effective return demonstration during final trial. Completed Dynamic Gait Index (DGI), scoring 13/24, suggesting significant risk of falling. See below for detailed findings. PT educated pt  on functional implications of test findings; pt verbalized understanding. Session ended in pt room, where pt was left seated in w/c with all needs within reach. No quick release belt utilized, as pt is able to consistently explain appropriate use of call bell.  Therapy Documentation Precautions:  Precautions Precautions: Fall Restrictions Weight Bearing Restrictions: No Pain: Pain Assessment Pain Assessment: No/denies pain Locomotion : Ambulation Ambulation/Gait Assistance: 4: Min guard;5: Supervision Wheelchair Mobility Distance: 120  Balance: Balance Balance Assessed: Yes Standardized Balance Assessment Standardized Balance Assessment: Dynamic Gait Index Dynamic Gait Index Level Surface: Mild Impairment (.85 m/s) Change in Gait Speed: Mild Impairment (Unable to achieve significant change in gait speed) Gait with Horizontal Head Turns: Moderate Impairment (Significant veering in direction of head turn) Gait with Vertical Head Turns: Mild Impairment Gait and Pivot Turn: Severe Impairment (Significant LOB requiring mod A to recover) Step Over Obstacle: Mild Impairment Step Around Obstacles: Mild Impairment Steps: Mild Impairment (alternating feet; must use rail)  See FIM for current functional status  Therapy/Group: Individual Therapy  [name], [name] 01/25/2014, 12:29 PM

## 2014-01-25 NOTE — Progress Notes (Signed)
Occupational Therapy Session Note  Patient Details  Name: PAWEL SOULES MRN: [id number] Date of Birth: 1956/04/23  Today's Date: 01/25/2014 Time: 1420-1500 Time Calculation (min): 40 min  Short Term Goals: Week 1:  OT Short Term Goal 1 (Week 1): STG=LTG of overall Mod I except Supervision for tub/shower transfer with DME  Skilled Therapeutic Interventions/Progress Updates:    Patient seen this pm for OT intervention to address activity tolerance / endurance during ADL.  Patient ambulating without device and with minimal assistance to supervision for balance and LE weakness.  Patient able to step over tub for tub transfer using grab bar.  Patient practiced with and without tub transfer bench, and patient prefers at this time to sponge bathe until able to stand at shower at home.  Discussed need to void frequently at night, and patient did think BSC would be valuable for home.    Therapy Documentation Precautions:  Precautions Precautions: Fall Restrictions Weight Bearing Restrictions: No  Pain:  No report of pain  See FIM for current functional status  Therapy/Group: Individual Therapy  Mariah Milling 01/25/2014, 3:46 PM

## 2014-01-25 NOTE — Progress Notes (Signed)
Patient information reviewed and entered into eRehab system by Bishop Vanderwerf, RN, CRRN, PPS Coordinator.  Information including medical coding and functional independence measure will be reviewed and updated through discharge.     Per nursing patient was given "Data Collection Information Summary for Patients in Inpatient Rehabilitation Facilities with attached "Privacy Act Statement-Health Care Records" upon admission.  

## 2014-01-25 NOTE — Progress Notes (Signed)
Occupational Therapy Session Note  Patient Details  Name: Ruben Blackwell MRN: [id number] Date of Birth: [birth year]  Today's Date: 01/25/2014 Time: 6122-4497 Time Calculation (min): 47 min  Short Term Goals: Week 1:  OT Short Term Goal 1 (Week 1): STG=LTG of overall Mod I except Supervision for tub/shower transfer with DME  Skilled Therapeutic Interventions/Progress Updates:    Pt seen for individual OT treatment session today for ADL retraining. Pt was up in w/c upon OT arrival, pt was given maximal encouragement to participate in a shower, however , he declined multiple times stating that he had "an accident earlier today and I've done all that, I'm cleaned up" Pt was agreeable to sponge bathing at sink level. Focus today was on activity tolerance, endurance, sit to stand transfers & participation. Pt was overall set up - min A with multiple rest breaks secondary to decreased endurance and activity tolerance related to UB bathing and dressing. Pt was Min A LB dressing (socks and shoes), he declined LB bathing despite multiple attempts and maximal verbal encouragement, "I know that I'll have to do that eventually and I will, but not today." Discussed taking shower tomorrow (01/26/14) and pt agreed to consider it. Pt was min/steady assist sit to stand transfer to toilet using grab bar/rail. 13 min missed for OT today, PT was made aware of this.  Therapy Documentation Precautions:  Precautions Precautions: Fall Restrictions Weight Bearing Restrictions: No General: General Amount of Missed OT Time (min): 13 Minutes Vital Signs:   Pain: Pain Assessment Pain Assessment: No/denies pain ADL:     See FIM for current functional status  Therapy/Group: Individual Therapy  Almyra Deforest 01/25/2014, 11:19 AM

## 2014-01-25 NOTE — Progress Notes (Signed)
Subjective/Complaints: 58 y.o. right-handed male multi-medical with history of hypertension, chronic renal insufficiency with baseline creatinine 2.11, tardive dyskinesia as well as Parkinson's disease and schizoaffective disorder. Admitted 01/14/2014 with a known abdominal aortic aneurysm of 4.7 cm increased to 6.3 cm aneurysm within 6 months. He elected surgical intervention and underwent AAA repair as well as primary repair of umbilical hernia 61/95/0932 per Dr. Trula Slade. Postoperative respiratory failure requiring intubation followed by critical care pulmonary services. Hospital course acute blood loss transfused with latest hemoglobin 12.2. Bouts of hypernatremia166 and IV fluids changed to D5W with latest sodium 143. Close monitoring of chronic renal insufficiency with latest creatinine 2.87. Patient spiked a low-grade fever for 100.5 chest x-ray completed showed persistent right lower lobe infiltrate and Levaquin initiated 01/20/2014 x7 days with followup chest x-ray 01/22/2014 improved  No new problems. Sleeping pretty well.  Limited ROS pt sleeing, no SOB no pain c/os Objective: Vital Signs: Blood pressure 100/64, pulse 77, temperature 99 F (37.2 C), temperature source Oral, resp. rate 18, height 5' 8.5" (1.74 m), weight 70.1 kg (154 lb 8.7 oz), SpO2 96.00%. No results found. Results for orders placed during the hospital encounter of 01/22/14 (from the past 72 hour(s))  CBC     Status: Abnormal   Collection Time    01/22/14  8:20 PM      Result Value Ref Range   WBC 14.2 (*) 4.0 - 10.5 K/uL   RBC 3.83 (*) 4.22 - 5.81 MIL/uL   Hemoglobin 12.5 (*) 13.0 - 17.0 g/dL   HCT 35.9 (*) 39.0 - 52.0 %   MCV 93.7  78.0 - 100.0 fL   MCH 32.6  26.0 - 34.0 pg   MCHC 34.8  30.0 - 36.0 g/dL   RDW 15.4  11.5 - 15.5 %   Platelets 360  150 - 400 K/uL  CREATININE, SERUM     Status: Abnormal   Collection Time    01/22/14  8:20 PM      Result Value Ref Range   Creatinine, Ser 2.23 (*) 0.50 - 1.35  mg/dL   GFR calc non Af Amer 31 (*) >90 mL/min   GFR calc Af Amer 36 (*) >90 mL/min   Comment: (NOTE)     The eGFR has been calculated using the CKD EPI equation.     This calculation has not been validated in all clinical situations.     eGFR's persistently <90 mL/min signify possible Chronic Kidney     Disease.  CBC WITH DIFFERENTIAL     Status: Abnormal   Collection Time    01/25/14  3:00 AM      Result Value Ref Range   WBC 13.4 (*) 4.0 - 10.5 K/uL   RBC 3.64 (*) 4.22 - 5.81 MIL/uL   Hemoglobin 12.0 (*) 13.0 - 17.0 g/dL   HCT 34.9 (*) 39.0 - 52.0 %   MCV 95.9  78.0 - 100.0 fL   MCH 33.0  26.0 - 34.0 pg   MCHC 34.4  30.0 - 36.0 g/dL   RDW 14.6  11.5 - 15.5 %   Platelets 482 (*) 150 - 400 K/uL   Neutrophils Relative % 57  43 - 77 %   Lymphocytes Relative 21  12 - 46 %   Monocytes Relative 20 (*) 3 - 12 %   Eosinophils Relative 2  0 - 5 %   Basophils Relative 0  0 - 1 %   Band Neutrophils 0  0 - 10 %   Metamyelocytes  Relative 0     Myelocytes 0     Promyelocytes Absolute 0     Blasts 0     nRBC 0  0 /100 WBC   Neutro Abs 7.6  1.7 - 7.7 K/uL   Lymphs Abs 2.8  0.7 - 4.0 K/uL   Monocytes Absolute 2.7 (*) 0.1 - 1.0 K/uL   Eosinophils Absolute 0.3  0.0 - 0.7 K/uL   Basophils Absolute 0.0  0.0 - 0.1 K/uL   WBC Morphology MILD LEFT SHIFT (1-5% METAS, OCC MYELO, OCC BANDS)    COMPREHENSIVE METABOLIC PANEL     Status: Abnormal   Collection Time    01/25/14  3:00 AM      Result Value Ref Range   Sodium 140  137 - 147 mEq/L   Potassium 5.2  3.7 - 5.3 mEq/L   Comment: HEMOLYSIS AT THIS LEVEL MAY AFFECT RESULT   Chloride 101  96 - 112 mEq/L   CO2 24  19 - 32 mEq/L   Glucose, Bld 106 (*) 70 - 99 mg/dL   BUN 44 (*) 6 - 23 mg/dL   Creatinine, Ser 2.25 (*) 0.50 - 1.35 mg/dL   Calcium 9.4  8.4 - 10.5 mg/dL   Total Protein 7.7  6.0 - 8.3 g/dL   Albumin 2.6 (*) 3.5 - 5.2 g/dL   AST 29  0 - 37 U/L   ALT 19  0 - 53 U/L   Alkaline Phosphatase 71  39 - 117 U/L   Total Bilirubin 0.3   0.3 - 1.2 mg/dL   GFR calc non Af Amer 31 (*) >90 mL/min   GFR calc Af Amer 36 (*) >90 mL/min   Comment: (NOTE)     The eGFR has been calculated using the CKD EPI equation.     This calculation has not been validated in all clinical situations.     eGFR's persistently <90 mL/min signify possible Chronic Kidney     Disease.      Physical Exam   Constitutional: He appears well-developed.   HENT:   Head: Normocephalic.   Eyes: EOM are normal.   Neck: Normal range of motion. Neck supple. No thyromegaly present.   Cardiovascular: Normal rate and regular rhythm.   Respiratory: Effort normal and breath sounds normal. No respiratory distress.   GI: Soft. Bowel sounds are normal. He exhibits no distension.  Neurological: He is alert.  Tremors all 4 limbs, low frequency. Patient provided his name, age, date of birth and place. Follows simple commands. UE grossly 4/5 prox to distally. LE 3/5 HF, KE and 4/5 distally. Sensory function intact. Decreased Kelly. A bit distracted. Needed redirection to stay on task  Skin:  Surgical site clean and dry  Psychiatric:  No agitation   Assessment/Plan: 1. Functional deficits secondary to Deconditioning which require 3+ hours per day of interdisciplinary therapy in a comprehensive inpatient rehab setting. Physiatrist is providing close team supervision and 24 hour management of active medical problems listed below. Physiatrist and rehab team continue to assess barriers to discharge/monitor patient progress toward functional and medical goals. FIM: FIM - Bathing Bathing Steps Patient Completed: Chest;Right Arm;Left Arm;Abdomen;Front perineal area;Buttocks Bathing: 4: Min-Patient completes 8-9 45f10 parts or 75+ percent  FIM - Upper Body Dressing/Undressing Upper body dressing/undressing steps patient completed: Thread/unthread right sleeve of pullover shirt/dresss;Thread/unthread left sleeve of pullover shirt/dress;Put head through opening of pull over  shirt/dress;Pull shirt over trunk Upper body dressing/undressing: 5: Set-up assist to: Obtain clothing/put away FIM -  Lower Body Dressing/Undressing Lower body dressing/undressing steps patient completed: Don/Doff right sock;Don/Doff left sock;Don/Doff right shoe;Don/Doff left shoe;Fasten/unfasten right shoe;Fasten/unfasten left shoe (declined to remove/change pants) Lower body dressing/undressing: 4: Steadying Assist  FIM - Toileting Toileting steps completed by patient: Adjust clothing prior to toileting;Performs perineal hygiene;Adjust clothing after toileting Toileting Assistive Devices: Grab bar or rail for support Toileting: 4: Assist with fasteners  FIM - Toilet Transfers Toilet Transfers: 4-From toilet/BSC: Min A (steadying Pt. > 75%)  FIM - Bed/Chair Transfer Bed/Chair Transfer Assistive Devices: Arm rests Bed/Chair Transfer: 4: Chair or W/C > Bed: Min A (steadying Pt. > 75%);4: Bed > Chair or W/C: Min A (steadying Pt. > 75%)  FIM - Locomotion: Wheelchair Distance: 100 Locomotion: Wheelchair: 2: Travels 50 - 149 ft with supervision, cueing or coaxing FIM - Locomotion: Ambulation Locomotion: Ambulation Assistive Devices: Other (comment) (no AD) Ambulation/Gait Assistance: 5: Supervision;4: Min guard Locomotion: Ambulation: 2: Travels 50 - 149 ft with minimal assistance (Pt.>75%)  Comprehension Comprehension Mode: Auditory Comprehension: 5-Follows basic conversation/direction: With extra time/assistive device  Expression Expression Mode: Verbal Expression: 4-Expresses basic 75 - 89% of the time/requires cueing 10 - 24% of the time. Needs helper to occlude trach/needs to repeat words.  Social Interaction Social Interaction: 6-Interacts appropriately with others with medication or extra time (anti-anxiety, antidepressant).  Problem Solving Problem Solving: 5-Solves basic 90% of the time/requires cueing < 10% of the time  Memory Memory: 5-Recognizes or recalls 90% of the  time/requires cueing < 10% of the time  Medical Problem List and Plan:  1. Functional deficits secondary to deconditioning related to AAA repair 01/14/2014 /multiple medical issues  2. DVT Prophylaxis/Anticoagulation: Lovenox. Monitor platelet counts any signs of bleeding  3. Pain Management: Tylenol as needed  4. Mood/schizoaffective disorder: Continue Depakote 1000 mg each bedtime, Prozac 20 mg daily, Haldol 2 mg each bedtime  5. Neuropsych: This patient is capable of making decisions on his own behalf.  6. Acute blood loss anemia. Latest hemoglobin improved 12.2. Followup CBC  7. Chronic renal insufficiency. Baseline creatinine 2.11. Followup chemistries  8. COPD/tobacco abuse/RLL infiltrate. Levaquin initiated 01/20/2014 x7 days. Continue nebulizer as needed, NicoDerm patch. Provide counseling. Resume Spiriva and Dulera as prior to admission  9. Hyperlipidemia. Zocor  10.GERD. Prilosec  11.Hypernatremia .resolved      LOS (Days) 3 A FACE TO FACE EVALUATION WAS PERFORMED  Ruben Blackwell 01/25/2014, 9:40 AM

## 2014-01-25 NOTE — Care Management Note (Signed)
Inpatient Rehabilitation Center Individual Statement of Services  Patient Name:  Ruben Blackwell  Date:  01/25/2014  Welcome to the [facility].  Our goal is to provide you with an individualized program based on your diagnosis and situation, designed to meet your specific needs.  With this comprehensive rehabilitation program, you will be expected to participate in at least 3 hours of rehabilitation therapies Monday-Friday, with modified therapy programming on the weekends.  Your rehabilitation program will include the following services:  Physical Therapy (PT), Occupational Therapy (OT), 24 hour per day rehabilitation nursing, Therapeutic Recreaction (TR), Case Management (Social Worker), Rehabilitation Medicine, Nutrition Services and Pharmacy Services  Weekly team conferences will be held on Wednesday to discuss your progress.  Your Social Worker will talk with you frequently to get your input and to update you on team discussions.  Team conferences with you and your family in attendance may also be held.  Expected length of stay: 7-10 days  Overall anticipated outcome: mod/i level  Depending on your progress and recovery, your program may change. Your Social Worker will coordinate services and will keep you informed of any changes. Your Social Worker's name and contact numbers are listed  below.  The following services may also be recommended but are not provided by the [facility] will be made to provide these services after discharge if needed.  Arrangements include referral to agencies that provide these services.  Your insurance has been verified to be:  Medicare Your primary doctor is:  Cathlean Cower  Pertinent information will be shared with your doctor and your insurance company.  Social Worker:  Ovidio Kin, [place]  or ([phone]  Information discussed with and copy given to patient by: [name], 01/25/2014, 10:10 AM

## 2014-01-25 NOTE — Progress Notes (Signed)
Social Work Assessment and Plan Social Work Assessment and Plan  Patient Details  Name: Ruben Blackwell MRN: [id number] Date of Birth: August 18, 1955  Today's Date: 01/25/2014  Problem List:  Patient Active Problem List   Diagnosis Date Noted  . Physical deconditioning 01/22/2014  . Hypernatremia 01/18/2014  . Acute on chronic renal failure 01/15/2014  . Lactic acidosis 01/15/2014  . Acute respiratory failure with hypoxia 01/14/2014  . DOE (dyspnea on exertion) 12/23/2013  . Pulmonary nodule 12/23/2013  . Barrett's esophagus 11/03/2013  . Personal history of colonic polyps-adenomas/ssp 11/03/2013  . Diarrhea 07/30/2013  . Abdominal aneurysm without mention of rupture 06/22/2013  . Nausea with vomiting 04/30/2013  . Unspecified chronic bronchitis 10/09/2012  . Cough 10/09/2012  . HTN (hypertension) 06/05/2012  . Weight loss 08/31/2011  . Bladder neck obstruction 08/31/2011  . Right otitis media 08/31/2011  . Impaired glucose tolerance 08/27/2011  . Preventative health care 08/27/2011  . COPD (chronic obstructive pulmonary disease) with emphysema 08/22/2010  . SKIN LESION 08/22/2010  . CORONARY ARTERY DISEASE 10/20/2009  . BIPOLAR DISORDER UNSPECIFIED 05/25/2009  . PARKINSON'S DISEASE 05/25/2009  . ABNORMAL STRESS ELECTROCARDIOGRAM 04/20/2009  . GERD 12/01/2008  . HYPERTHYROIDISM 08/18/2008  . DEPRESSION 08/18/2008  . SMOKER 09/29/2007  . HYPERLIPIDEMIA 05/20/2007  . 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 TARDIVE DYSKINESIA 05/20/2007  . PILONIDAL CYST, WITH ABSCESS 05/20/2007  . FATIGUE 05/20/2007   Past Medical History:  Past Medical History  Diagnosis Date  . Impaired glucose tolerance 08/27/2011  . BIPOLAR DISORDER UNSPECIFIED 05/25/2009  . COPD 08/22/2010  . CORONARY ARTERY DISEASE 10/20/2009  . HYPERLIPIDEMIA 05/20/2007  . PARKINSON'S DISEASE 05/25/2009  . 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 TARDIVE DYSKINESIA 05/20/2007  . DEPRESSION 08/18/2008  . GERD  12/01/2008  . HYPERTHYROIDISM 08/18/2008  . Unspecified chronic bronchitis 10/09/2012  . Peripheral vascular disease   . Barrett's esophagus 10/26/13    Dx EGD 10/2013  . Personal history of colonic polyps-adenomas/ssp 10/26/13  . Diverticulosis 10/26/13    mild  . Shortness of breath   . Hypertension     Pt reports that Dr. Cathlean Cower placed him on BP meds temporarily but no longer takes.  Marland Kitchen PONV (postoperative nausea and vomiting)     Pt reports nausea only after cardiac cath  . Anxiety   . CKD (chronic kidney disease), stage III   . Cancer     "precancer; my colon and esophagus" (01/15/2014)  . AAA (abdominal aortic aneurysm)   . History of blood transfusion 01/14/2014    related to AAA repair/notes 01/15/2014   Past Surgical History:  Past Surgical History  Procedure Laterality Date  . Pilonidal cyst / sinus excision    . Esophagogastroduodenoscopy    . Colonoscopy w/ biopsies    . Abdominal aortic aneurysm repair N/A 01/14/2014    Procedure: ABDOMINAL AORTIC ANEURYSM WITH BI ILIAC REPAIR; PRIMARY REPAIR OF UMBILICAL HERNIA;  Surgeon: Serafina Mitchell, MD;  Location: [facility];  Service: Vascular;  Laterality: N/A;  . Hernia repair      "belly"  . Cardiac catheterization  06-16-2009    Dr. Johnsie Cancel  . Esophageal dilation      "once"   Social History:  reports that he has been smoking Cigarettes.  He has a 100 pack-year smoking history. He has never used smokeless tobacco. He reports that he drinks alcohol. He reports that he does not use illicit drugs.  Family / Support Systems Marital Status: Single Patient Roles: Other (Comment) (Sibling) Other Supports: Hydrographic surveyor  [phone]  [phone] Anticipated Caregiver: Hassan Rowan and neighbor-Charlie Ability/Limitations of Caregiver: Hassan Rowan and neighbor can only do intermittent assist Caregiver Availability: Intermittent Family Dynamics: Pt is close with his sister and has good support by [place] his nieghbor.  He was managing on his own and  drove to do his errands and get his meals.  He ate out a lot did not like cooking.  He lives a simple life, according to pt.  Social History Preferred language: English Religion: Baptist Cultural Background: No issues Education: [employer] Read: Yes Write: Yes Employment Status: Disabled Date Retired/Disabled/Unemployed: Higher education careers adviser Issues: No issues Guardian/Conservator: None-according to MD pt is capable of making his own decisions while here.  Will also include his sister due to pt prefers this   Abuse/Neglect Physical Abuse: Denies Verbal Abuse: Denies Sexual Abuse: Denies Exploitation of patient/patient's resources: Denies Self-Neglect: Denies  Emotional Status Pt's affect, behavior adn adjustment status: Pt is motivated to get better and go home.  He realizes he is weaker and will not be able to drive when he first goes home from the hospital.  He knows he needs to be patient and work with the team to recover from this surgery.  He is very cooperative with therapy team. Recent Psychosocial Issues: Other medical issues and pysch issues Pyschiatric History: History of schizoaffective disorder and depression-takes medicines and has weekly counseling sessions with [facility]-home visits.  He feels this helps him and likes the counselor.  At this point no signs of depression and will defer unless team feels as working with pt needs intervention is needed.  Patient / Family Perceptions, Expectations & Goals Pt/Family understanding of illness & functional limitations: Pt is able to explain his surgery and limitaitons, he seems to have a basic understanding of his recovery plan.  His sister talks with MD and is aware of her brothers treatment plan and time for recovery.  Pt just gets ansy at times waiting for the therapists to come.  Will ask Lisa-RT to see and provide activities for him in between therapy sessions. Premorbid pt/family roles/activities: Brother,  Friend, Retiree, etc Anticipated changes in roles/activities/participation: resume Pt/family expectations/goals: Pt states; " I want to do as much as I can for myself."  Sister states: " I hope he can be safe to stay alone, since I can't be there with him."  [employer]: Other (Comment) ([facility]-Counseling HOme visits) Premorbid Home Care/DME Agencies: None Transportation available at discharge: Family  Discharge Planning Living Arrangements: Alone Support Systems: Other relatives;Friends/neighbors Type of Residence: Private residence Insurance Resources: [employer] Financial Resources: [employer] Financial Screen Referred: No Living Expenses: Rent Money Management: Patient Does the patient have any problems obtaining your medications?: No Home Management: Self, sister may need to assist once goes home from the hospital Patient/Family Preliminary Plans: Return home wiht intermittent assist from sister and friend-Charlie.  He will need to be mod/i level to return home since no one can provide 24 hr care at home.  He should be able to reach this level and is doing well here.  Will work with pt and sister on a safe discharge plan. Social Work Anticipated Follow Up Needs: HH/OP;Support Group  Clinical Impression Pleasant gentleman who is motivated to do well here and return home.  His sister is supportive and willing to assist with what she can for her brother.  Pt should reach mod/i level Goals and be safe at home.  He is aware he can not drive when discharged  from the hospital, his sister and neighbor will assist with this.  Work on a safe discharge plan.  [name] 01/25/2014, 10:28 AM

## 2014-01-26 ENCOUNTER — Inpatient Hospital Stay (HOSPITAL_COMMUNITY): Payer: Medicare Other | Admitting: Occupational Therapy

## 2014-01-26 ENCOUNTER — Inpatient Hospital Stay (HOSPITAL_COMMUNITY): Payer: Medicare Other | Admitting: Physical Therapy

## 2014-01-26 ENCOUNTER — Encounter (HOSPITAL_COMMUNITY): Payer: Medicare Other | Admitting: Occupational Therapy

## 2014-01-26 ENCOUNTER — Inpatient Hospital Stay (HOSPITAL_COMMUNITY): Payer: Medicare Other | Admitting: *Deleted

## 2014-01-26 LAB — PATHOLOGIST SMEAR REVIEW

## 2014-01-26 NOTE — Progress Notes (Signed)
  [facility] Progress Note  01/26/2014 11:11 AM  Subjective:  Patient is doing well in inpatient rehab. Has no complaints. Has been ambulating.    Filed Vitals:   01/26/14 0518  BP: 123/80  Pulse: 97  Temp: 98.4 F (36.9 C)  Resp: 19    Physical Exam: Cardiac: RRR, no m/g/r Lungs: L basilar wheeze, clear on right.  Incisions:  Midline incision c/d/i. Healing well Extremities:  Warm feet. Palpable DP pulses bilaterally  CBC    Component Value Date/Time   WBC 13.4* 01/25/2014 0300   RBC 3.64* 01/25/2014 0300   HGB 12.0* 01/25/2014 0300   HCT 34.9* 01/25/2014 0300   PLT 482* 01/25/2014 0300   MCV 95.9 01/25/2014 0300   MCH 33.0 01/25/2014 0300   MCHC 34.4 01/25/2014 0300   RDW 14.6 01/25/2014 0300   LYMPHSABS 2.8 01/25/2014 0300   MONOABS 2.7* 01/25/2014 0300   EOSABS 0.3 01/25/2014 0300   BASOSABS 0.0 01/25/2014 0300    BMET    Component Value Date/Time   NA 140 01/25/2014 0300   K 5.2 01/25/2014 0300   CL 101 01/25/2014 0300   CO2 24 01/25/2014 0300   GLUCOSE 106* 01/25/2014 0300   BUN 44* 01/25/2014 0300   CREATININE 2.25* 01/25/2014 0300   CREATININE 1.87* 12/19/2013 1159   CALCIUM 9.4 01/25/2014 0300   GFRNONAA 31* 01/25/2014 0300   GFRAA 36* 01/25/2014 0300    INR    Component Value Date/Time   INR 1.08 01/14/2014 1840     Intake/Output Summary (Last 24 hours) at 01/26/14 1111 Last data filed at 01/26/14 0930  Gross per 24 hour  Intake   1200 ml  Output      0 ml  Net   1200 ml     Assessment:  58 y.o. male is s/p:  Open AAA repair 01/14/14  Plan: -Patient continuing to do well in CIR.  -Continued ambulation. -Encourage IS.  -DVT prophylaxis:  Lovenox    Virgina Jock, PA-C [facility] Office: [phone] Pager: [phone] 01/26/2014 11:11 AM

## 2014-01-26 NOTE — Progress Notes (Signed)
Occupational Therapy Session Note  Patient Details  Name: Ruben Blackwell MRN: [id number] Date of Birth: 18-Mar-1956  Today's Date: 01/26/2014 Time: 1115-1200 Time Calculation (min): 45 min  Short Term Goals: Week 1:  OT Short Term Goal 1 (Week 1): STG=LTG of overall Mod I except Supervision for tub/shower transfer with DME  Skilled Therapeutic Interventions/Progress Updates:    Patient seen this am for OT intervention to address balance, stand tolerance, and overall endurance during basic self care skills.  Patient is progressing well toward modified independent goals, and discussed discharge planning again with him.  Patient with repeated report of back pain and fatigue which he relates to poor kidney function.  Patient performed shower at supervision level.    Therapy Documentation Precautions:  Precautions Precautions: Fall Restrictions Weight Bearing Restrictions: No   Pain: Pain Assessment Pain Assessment: No/denies pain  See FIM for current functional status  Therapy/Group: Individual Therapy  Mariah Milling 01/26/2014, 12:36 PM

## 2014-01-26 NOTE — Progress Notes (Signed)
Subjective/Complaints: 58 y.o. right-handed male multi-medical with history of hypertension, chronic renal insufficiency with baseline creatinine 2.11, tardive dyskinesia as well as Parkinson's disease and schizoaffective disorder. Admitted 01/14/2014 with a known abdominal aortic aneurysm of 4.7 cm increased to 6.3 cm aneurysm within 6 months. He elected surgical intervention and underwent AAA repair as well as primary repair of umbilical hernia 19/37/9024 per Dr. Trula Slade. Postoperative respiratory failure requiring intubation followed by critical care pulmonary services. Hospital course acute blood loss transfused with latest hemoglobin 12.2. Bouts of hypernatremia166 and IV fluids changed to D5W with latest sodium 143. Close monitoring of chronic renal insufficiency with latest creatinine 2.87. Patient spiked a low-grade fever for 100.5 chest x-ray completed showed persistent right lower lobe infiltrate and Levaquin initiated 01/20/2014 x7 days with followup chest x-ray 01/22/2014 improved  Wants to shower. Denies any other problems at this point.     Objective: Vital Signs: Blood pressure 123/80, pulse 97, temperature 98.4 F (36.9 C), temperature source Oral, resp. rate 19, height 5' 8.5" (1.74 m), weight 70.1 kg (154 lb 8.7 oz), SpO2 92.00%. No results found. Results for orders placed during the hospital encounter of 01/22/14 (from the past 72 hour(s))  CBC WITH DIFFERENTIAL     Status: Abnormal   Collection Time    01/25/14  3:00 AM      Result Value Ref Range   WBC 13.4 (*) 4.0 - 10.5 K/uL   RBC 3.64 (*) 4.22 - 5.81 MIL/uL   Hemoglobin 12.0 (*) 13.0 - 17.0 g/dL   HCT 34.9 (*) 39.0 - 52.0 %   MCV 95.9  78.0 - 100.0 fL   MCH 33.0  26.0 - 34.0 pg   MCHC 34.4  30.0 - 36.0 g/dL   RDW 14.6  11.5 - 15.5 %   Platelets 482 (*) 150 - 400 K/uL   Neutrophils Relative % 57  43 - 77 %   Lymphocytes Relative 21  12 - 46 %   Monocytes Relative 20 (*) 3 - 12 %   Eosinophils Relative 2  0 - 5 %    Basophils Relative 0  0 - 1 %   Band Neutrophils 0  0 - 10 %   Metamyelocytes Relative 0     Myelocytes 0     Promyelocytes Absolute 0     Blasts 0     nRBC 0  0 /100 WBC   Neutro Abs 7.6  1.7 - 7.7 K/uL   Lymphs Abs 2.8  0.7 - 4.0 K/uL   Monocytes Absolute 2.7 (*) 0.1 - 1.0 K/uL   Eosinophils Absolute 0.3  0.0 - 0.7 K/uL   Basophils Absolute 0.0  0.0 - 0.1 K/uL   WBC Morphology MILD LEFT SHIFT (1-5% METAS, OCC MYELO, OCC BANDS)    COMPREHENSIVE METABOLIC PANEL     Status: Abnormal   Collection Time    01/25/14  3:00 AM      Result Value Ref Range   Sodium 140  137 - 147 mEq/L   Potassium 5.2  3.7 - 5.3 mEq/L   Comment: HEMOLYSIS AT THIS LEVEL MAY AFFECT RESULT   Chloride 101  96 - 112 mEq/L   CO2 24  19 - 32 mEq/L   Glucose, Bld 106 (*) 70 - 99 mg/dL   BUN 44 (*) 6 - 23 mg/dL   Creatinine, Ser 2.25 (*) 0.50 - 1.35 mg/dL   Calcium 9.4  8.4 - 10.5 mg/dL   Total Protein 7.7  6.0 - 8.3 g/dL  Albumin 2.6 (*) 3.5 - 5.2 g/dL   AST 29  0 - 37 U/L   ALT 19  0 - 53 U/L   Alkaline Phosphatase 71  39 - 117 U/L   Total Bilirubin 0.3  0.3 - 1.2 mg/dL   GFR calc non Af Amer 31 (*) >90 mL/min   GFR calc Af Amer 36 (*) >90 mL/min   Comment: (NOTE)     The eGFR has been calculated using the CKD EPI equation.     This calculation has not been validated in all clinical situations.     eGFR's persistently <90 mL/min signify possible Chronic Kidney     Disease.      Physical Exam   Constitutional: He appears well-developed.   HENT:   Head: Normocephalic.   Eyes: EOM are normal.   Neck: Normal range of motion. Neck supple. No thyromegaly present.   Cardiovascular: Normal rate and regular rhythm.   Respiratory: Effort normal and breath sounds normal. No respiratory distress.   GI: Soft. Bowel sounds are normal. He exhibits no distension.  Neurological: He is alert.  Dyskinesias all 4 limbs.  Follows simple commands. UE grossly 4/5 prox to distally. LE 3/5 HF, KE and 4/5 distally.  Sensory function intact. Decreased Marydel. A bit distracted. Needed redirection to stay on task  Skin:  Surgical site clean and dry  Psychiatric:  No agitation   Assessment/Plan: 1. Functional deficits secondary to Deconditioning which require 3+ hours per day of interdisciplinary therapy in a comprehensive inpatient rehab setting. Physiatrist is providing close team supervision and 24 hour management of active medical problems listed below. Physiatrist and rehab team continue to assess barriers to discharge/monitor patient progress toward functional and medical goals. FIM: FIM - Bathing Bathing Steps Patient Completed: Chest;Right Arm;Left Arm;Abdomen (Pt reports having an accident earlier this morning and states "I've already washed up my lower body & down there") Bathing: 4: Min-Patient completes 8-9 36f 10 parts or 75+ percent  FIM - Upper Body Dressing/Undressing Upper body dressing/undressing steps patient completed: Thread/unthread right sleeve of pullover shirt/dresss;Thread/unthread left sleeve of pullover shirt/dress;Put head through opening of pull over shirt/dress;Pull shirt over trunk Upper body dressing/undressing: 5: Set-up assist to: Obtain clothing/put away FIM - Lower Body Dressing/Undressing Lower body dressing/undressing steps patient completed: Don/Doff right sock;Don/Doff left sock;Don/Doff right shoe;Don/Doff left shoe;Fasten/unfasten right shoe;Fasten/unfasten left shoe Lower body dressing/undressing: 4: Steadying Assist  FIM - Toileting Toileting steps completed by patient: Adjust clothing prior to toileting;Performs perineal hygiene;Adjust clothing after toileting Toileting Assistive Devices: Grab bar or rail for support Toileting: 4: Steadying assist  FIM - Radio producer Devices: Grab bars Toilet Transfers: 4-From toilet/BSC: Min A (steadying Pt. > 75%)  FIM - Bed/Chair Transfer Bed/Chair Transfer Assistive Devices: Arm  rests Bed/Chair Transfer: 4: Chair or W/C > Bed: Min A (steadying Pt. > 75%);4: Bed > Chair or W/C: Min A (steadying Pt. > 75%)  FIM - Locomotion: Wheelchair Distance: 120 Locomotion: Wheelchair: 0: Activity did not occur FIM - Locomotion: Ambulation Locomotion: Ambulation Assistive Devices: Other (comment) (no AD) Ambulation/Gait Assistance: 4: Min guard;5: Supervision Locomotion: Ambulation: 4: Travels 150 ft or more with minimal assistance (Pt.>75%)  Comprehension Comprehension Mode: Auditory Comprehension: 5-Follows basic conversation/direction: With extra time/assistive device  Expression Expression Mode: Verbal Expression: 4-Expresses basic 75 - 89% of the time/requires cueing 10 - 24% of the time. Needs helper to occlude trach/needs to repeat words.  Social Interaction Social Interaction: 6-Interacts appropriately with others with medication or  extra time (anti-anxiety, antidepressant).  Problem Solving Problem Solving: 5-Solves basic 90% of the time/requires cueing < 10% of the time  Memory Memory: 5-Recognizes or recalls 90% of the time/requires cueing < 10% of the time  Medical Problem List and Plan:  1. Functional deficits secondary to deconditioning related to AAA repair 01/14/2014 /multiple medical issues  2. DVT Prophylaxis/Anticoagulation: Lovenox  3. Pain Management: Tylenol as needed  4. Mood/schizoaffective disorder: Continue Depakote 1000 mg each bedtime, Prozac 20 mg daily, Haldol 2 mg each bedtime  5. Neuropsych: This patient is capable of making decisions on his own behalf.  6. Acute blood loss anemia. Latest hemoglobin improved 12.2. Followup CBC  7. Chronic renal insufficiency. Baseline creatinine 2.11. Followup chemistries consistent 8. COPD/tobacco abuse/RLL infiltrate. Levaquin initiated 01/20/2014 x7 days. Continue nebulizer as needed, NicoDerm patch. Provide counseling. Resume Spiriva and Dulera as prior to admission  9. Hyperlipidemia. Zocor   10.GERD. Prilosec  11.Hypernatremia .resolved      LOS (Days) 4 A FACE TO FACE EVALUATION WAS PERFORMED  SWARTZ,ZACHARY T 01/26/2014, 9:24 AM

## 2014-01-26 NOTE — Progress Notes (Signed)
Physical Therapy Session Note  Patient Details  Name: Ruben Blackwell MRN: [id number] Date of Birth: [birth year]  Today's Date: 01/26/2014 Time: 0900-1000 Time Calculation (min): 60 min  Short Term Goals: Week 1:  PT Short Term Goal 1 (Week 1): STG=LTG  Skilled Therapeutic Interventions/Progress Updates:    Pt received sitting in w/c; agreeable to therapy. Session focused on increasing pt stability/independence with high level ambulation and addressing impairments identified by Dynamic Gait Index. See below for detailed description of gait training interventions. Performed gait 2x150', 2x75' in controlled and home environments with supervision during linear gait, close supervision with head turns and sidestepping. Frequent rest breaks required throughout session, per pt request, due to pt fatigue. No overt LOB noted during this session. Session ended in pt room, where pt was left seated in w/c with all needs within reach.  Therapy Documentation Precautions:  Precautions Precautions: Fall Restrictions Weight Bearing Restrictions: No Pain: Pain Assessment Pain Assessment: No/denies pain Gait Training:   Bilat sidestepping x25' per direction with close supervision. In rehab apartment, performed Sidestepping with dual tasking (searching for 10 items in kitchen cabinets) in rehab apartment kitchen). Seated rest break taken (per pt request) after performing task x3.5 minutes and finding initial 6 items.  Additional 4 minutes, 20 seconds required to locate last 4 items. Pt required no cueing to find initial 8 items; max cueing to locate final 2 items (both of which were on top shelf of cabinet). During final gait trial, addressed dual tasking, gait with horizontal head turns by instructing pt to read room numbers and hallway signs while ambulating with close supervision. Pt demonstrated less veering of gait toward side of head turn, as compared with previous sessions. Pt performed 180-degree  turning during gait without LOB.   See FIM for current functional status  Therapy/Group: Individual Therapy  Stefano Gaul 01/26/2014, 8:26 PM

## 2014-01-26 NOTE — Progress Notes (Signed)
Occupational Therapy Session Note  Patient Details  Name: [name] MRN: [id number] Date of Birth: December 08, 1955  Today's Date: 01/26/2014 Time: 6962-9528 Time Calculation (min): 42 min  Skilled Therapeutic Interventions/Progress Updates:    Patient seen this pm for OT intervention to address dynamic standing balance, functional mobility, and endurance.  Patient opting to shower without a bench.  Addressed standing with eyes closed, head tipped forward / back as needed for shampooing, turning in a circle with eyes closed, standing on one leg, as needed to clean lower legs and feet.  Simple meal prep - patient safely walking in kitchen; able to reach high /. Low cupboards, obtain / replace items from refrigerator, freezer, microwave, cupboards and drawers.     Therapy Documentation Precautions:  Precautions Precautions: Fall Restrictions Weight Bearing Restrictions: No  Pain: Pain Assessment Pain Assessment: No/denies pain  See FIM for current functional status  Therapy/Group: Individual Therapy  Mariah Milling 01/26/2014, 3:27 PM

## 2014-01-26 NOTE — Progress Notes (Addendum)
Physical Therapy Session Note  Patient Details  Name: Ruben Blackwell MRN: [id number] Date of Birth: April 17, 1956  Today's Date: 01/26/2014 Time: 1020-1105 Time Calculation (min): 45 min  Short Term Goals: Week 1:  PT Short Term Goal 1 (Week 1): STG=LTG  Skilled Therapeutic Interventions/Progress Updates:   Session focused on improving activity tolerance, functional transfers, and improved gait mechanics. Pt received sitting in w/c with QRB on, agreeable to therapy. Gait training without AD throughout rehab unit with S in controlled and home environments, multiple times up to 150 ft before requiring prolonged seated rest break. Pt performed car transfer with close S and vc's for safe technique. Pt requesting water, ambulated to RN station where patient performed functional task of getting drink and carrying into gym with S. Gait training in hallway with emphasis on increased gait speed and reciprocal arm swing with effective within-session carryover before fatiguing. Pt performed sit <> stand from mat without UE support x 10 for LE strengthening/endurance. NuStep Level 2 using BUE/LEs; pt able to complete 2 min before needing rest and given 1 min rest. Pt able to perform additional 80 sec before requiring rest. Pt returned to room and left sitting in w/c with QRB on and all needs within reach.   Therapy Documentation Precautions:  Precautions Precautions: Fall Restrictions Weight Bearing Restrictions: No Pain: Pain Assessment Pain Assessment: No/denies pain Locomotion : Ambulation Ambulation/Gait Assistance: 5: Supervision     See FIM for current functional status  Therapy/Group: Individual Therapy  Laretta Alstrom 01/26/2014, 11:36 AM

## 2014-01-27 ENCOUNTER — Inpatient Hospital Stay (HOSPITAL_COMMUNITY): Payer: Medicare Other | Admitting: Physical Therapy

## 2014-01-27 ENCOUNTER — Encounter (HOSPITAL_COMMUNITY): Payer: Medicare Other | Admitting: Occupational Therapy

## 2014-01-27 ENCOUNTER — Inpatient Hospital Stay (HOSPITAL_COMMUNITY): Payer: Medicare Other

## 2014-01-27 ENCOUNTER — Ambulatory Visit (HOSPITAL_COMMUNITY): Payer: Medicare Other | Admitting: Physical Therapy

## 2014-01-27 DIAGNOSIS — R5381 Other malaise: Secondary | ICD-10-CM

## 2014-01-27 NOTE — Progress Notes (Signed)
Physical Therapy Session Note  Patient Details  Name: Ruben Blackwell MRN: [id number] Date of Birth: 25-Oct-1955  Today's Date: 01/27/2014 Time: 0825-0925 Time Calculation (min): 60 min  Short Term Goals: Week 1:  PT Short Term Goal 1 (Week 1): STG=LTG  Skilled Therapeutic Interventions/Progress Updates:  Patient resting in bed upon entering room. Patient supine to sit independently. Patient sit to stand, ambulate to bathroom and performed toileting tasks (managing clothes and hygiene) with supervision. Patient reported that clothes were being washed. Patient ambulated to/from laundry area >200 feet and got clothes out of dryer with supervision. Remainder of session concentrated on endurance and activity tolerance - with close monitoring of O2 sats with activity. Patient threw soccer ball against rebounder x 20 reps and x 1 minute; ambulated up and down 15 steps without hand rails with supervision, and nustep x 2 minutes on level 2 using all extremities. Patient's O2 sats would drop into the 70's with activity and reached 69 at one point during the session. Patient instructed in pursed lip breathing and would recover to >90% after couple minutes. Patient asymptomatic during times with low sats. Patient ambulated back to room with supervision and left in bed with bed alarm on and all items in reach. Visitor present in room.   Therapy Documentation Precautions:  Precautions Precautions: Fall Restrictions Weight Bearing Restrictions: No  Vital Signs: Therapy Vitals Pulse Rate: 95 BP: 122/85 mmHg Oxygen Therapy SpO2: 69 % O2 Device: None (Room air) with activity in therapy. Patient encouraged with deep breathing and O2sat would eventually recover above 90%. Nursing notified twice during therapy session and consulted following session. Pain: Pain Assessment Pain Assessment: No/denies pain Mobility:   Locomotion : Ambulation Ambulation/Gait Assistance: 5: Supervision   See FIM for  current functional status  Therapy/Group: Individual Therapy  Sanjuana Letters 01/27/2014, 9:38 AM

## 2014-01-27 NOTE — Plan of Care (Signed)
Problem: RH Balance Goal: LTG Patient will maintain dynamic sitting balance (PT) LTG: Patient will maintain dynamic sitting balance without assistance during mobility activities (PT)  Outcome: Completed/Met Date Met:  01/27/14 Goal surpassed, as pt is independent with dynamic sitting balance.  Problem: RH Bed to Chair Transfers Goal: LTG Patient will perform bed/chair transfers w/assist (PT) LTG: Patient will perform bed/chair transfers without assistance, with/without cues (PT).  Outcome: Completed/Met Date Met:  01/27/14 Goal surpassed, as pt is independent with bed mobility.  Problem: RH Ambulation Goal: LTG Patient will ambulate in controlled environment (PT) LTG: Patient will ambulate in a controlled environment, # of feet with assistance (PT).  Outcome: Not Applicable Date Met:  92/49/32 Goal surpassed, as pt is independent with gait. Goal: LTG Patient will ambulate in home environment (PT) LTG: Patient will ambulate in home environment, # of feet with assistance (PT).  Outcome: Completed/Met Date Met:  01/27/14 Goal surpassed, as pt is independent with gait in home environment.

## 2014-01-27 NOTE — Progress Notes (Signed)
Social Work Patient ID: Ruben Blackwell, male   DOB: 1956-05-17, 58 y.o.   MRN: [id number] Met with pt and left message for sister to inform team conference goals of mod/i level and discharge 6/18.  Pt feels he is ready to go home. He feels he has done well here and just needs to slow down and take rest breaks.  He is agreeable to 3 in 1 and follow up therapy. Will await sister's return call regarding questions.

## 2014-01-27 NOTE — Progress Notes (Signed)
Physical Therapy Session Note  Patient Details  Name: Ruben Blackwell MRN: [id number] Date of Birth: 03-14-1956  Today's Date: 01/27/2014 Time: 1400-1500 Time Calculation (min): 60 min  Short Term Goals: Week 1:  PT Short Term Goal 1 (Week 1): STG=LTG  Skilled Therapeutic Interventions/Progress Updates:    Pt participated in group session focused on increasing stability/independence with gait, stair negotiation, and dynamic standing balance. Gait training from pt room<>treatment gym in controlled environment without assistive device with mod I, increased time. Graded sit<>stand transfers x10 reps total from progressively lower mat table height. NuStep for increased activity tolerance x2.5 minutes, x2 minutes (seated rest break between trials). Negotiated 15 steps total, forward-facing with step-to pattern. Frequent seated rest breaks required throughout session. SpO2 WNL throughout session. Session ended in pt room, where pt was left seated in EOB with all needs within reach.   Therapy Documentation Precautions:  Precautions Precautions: Fall Restrictions Weight Bearing Restrictions: No Vital Signs: Therapy Vitals Temp: 99.1 F (37.3 C) Temp src: Oral Pulse Rate: 99 Resp: 18 BP: 120/82 mmHg Patient Position (if appropriate): Lying Oxygen Therapy SpO2: 93 % O2 Device: None (Room air) Pain:  Pt reports no pain during session.  See FIM for current functional status  Therapy/Group: Group Therapy  [name], [name] 01/27/2014, 4:04 PM

## 2014-01-27 NOTE — Progress Notes (Signed)
Subjective/Complaints: 58 y.o. right-handed male multi-medical with history of hypertension, chronic renal insufficiency with baseline creatinine 2.11, tardive dyskinesia as well as Parkinson's disease and schizoaffective disorder. Admitted 01/14/2014 with a known abdominal aortic aneurysm of 4.7 cm increased to 6.3 cm aneurysm within 6 months. He elected surgical intervention and underwent AAA repair as well as primary repair of umbilical hernia 93/71/6967 per Dr. Trula Slade. Postoperative respiratory failure requiring intubation followed by critical care pulmonary services. Hospital course acute blood loss transfused with latest hemoglobin 12.2. Bouts of hypernatremia166 and IV fluids changed to D5W with latest sodium 143. Close monitoring of chronic renal insufficiency with latest creatinine 2.87. Patient spiked a low-grade fever for 100.5 chest x-ray completed showed persistent right lower lobe infiltrate and Levaquin initiated 01/20/2014 x7 days with followup chest x-ray 01/22/2014 improved  Talking to sister, no c/o of abd pain, no cough  Review of Systems - Negative except weakness    Objective: Vital Signs: Blood pressure 122/85, pulse 95, temperature 97.1 F (36.2 C), temperature source Oral, resp. rate 18, height 5' 8.5" (1.74 m), weight 70.1 kg (154 lb 8.7 oz), SpO2 69.00%. No results found. Results for orders placed during the hospital encounter of 01/22/14 (from the past 72 hour(s))  CBC WITH DIFFERENTIAL     Status: Abnormal   Collection Time    01/25/14  3:00 AM      Result Value Ref Range   WBC 13.4 (*) 4.0 - 10.5 K/uL   RBC 3.64 (*) 4.22 - 5.81 MIL/uL   Hemoglobin 12.0 (*) 13.0 - 17.0 g/dL   HCT 34.9 (*) 39.0 - 52.0 %   MCV 95.9  78.0 - 100.0 fL   MCH 33.0  26.0 - 34.0 pg   MCHC 34.4  30.0 - 36.0 g/dL   RDW 14.6  11.5 - 15.5 %   Platelets 482 (*) 150 - 400 K/uL   Neutrophils Relative % 57  43 - 77 %   Lymphocytes Relative 21  12 - 46 %   Monocytes Relative 20 (*) 3 - 12 %    Eosinophils Relative 2  0 - 5 %   Basophils Relative 0  0 - 1 %   Band Neutrophils 0  0 - 10 %   Metamyelocytes Relative 0     Myelocytes 0     Promyelocytes Absolute 0     Blasts 0     nRBC 0  0 /100 WBC   Neutro Abs 7.6  1.7 - 7.7 K/uL   Lymphs Abs 2.8  0.7 - 4.0 K/uL   Monocytes Absolute 2.7 (*) 0.1 - 1.0 K/uL   Eosinophils Absolute 0.3  0.0 - 0.7 K/uL   Basophils Absolute 0.0  0.0 - 0.1 K/uL   WBC Morphology MILD LEFT SHIFT (1-5% METAS, OCC MYELO, OCC BANDS)    COMPREHENSIVE METABOLIC PANEL     Status: Abnormal   Collection Time    01/25/14  3:00 AM      Result Value Ref Range   Sodium 140  137 - 147 mEq/L   Potassium 5.2  3.7 - 5.3 mEq/L   Comment: HEMOLYSIS AT THIS LEVEL MAY AFFECT RESULT   Chloride 101  96 - 112 mEq/L   CO2 24  19 - 32 mEq/L   Glucose, Bld 106 (*) 70 - 99 mg/dL   BUN 44 (*) 6 - 23 mg/dL   Creatinine, Ser 2.25 (*) 0.50 - 1.35 mg/dL   Calcium 9.4  8.4 - 10.5 mg/dL   Total  Protein 7.7  6.0 - 8.3 g/dL   Albumin 2.6 (*) 3.5 - 5.2 g/dL   AST 29  0 - 37 U/L   ALT 19  0 - 53 U/L   Alkaline Phosphatase 71  39 - 117 U/L   Total Bilirubin 0.3  0.3 - 1.2 mg/dL   GFR calc non Af Amer 31 (*) >90 mL/min   GFR calc Af Amer 36 (*) >90 mL/min   Comment: (NOTE)     The eGFR has been calculated using the CKD EPI equation.     This calculation has not been validated in all clinical situations.     eGFR's persistently <90 mL/min signify possible Chronic Kidney     Disease.  PATHOLOGIST SMEAR REVIEW     Status: None   Collection Time    01/25/14  3:00 AM      Result Value Ref Range   Path Review            Comment: Marked thrombocytosis associated     with neutrophilia and mild left     shift. Both appear reactive     and probably related.     Reviewed by Lennox Solders Lyndon Code, M.D.     01/26/14.      Physical Exam   Constitutional: He appears well-developed.   HENT:   Head: Normocephalic.   Eyes: EOM are normal.   Neck: Normal range of motion. Neck supple. No  thyromegaly present.   Cardiovascular: Normal rate and regular rhythm.   Respiratory: Effort normal and breath sounds normal. No respiratory distress.   GI: Soft. Bowel sounds are normal. He exhibits no distension.  Neurological: He is alert.  Dyskinesias all 4 limbs.  Follows simple commands. UE grossly 4/5 prox to distally. LE 4/5 HF, KE and 4/5 distally. Sensory function intact. Decreased [place]. A bit distracted. Needed redirection to stay on task  Skin:  Surgical site clean and dry , dermabond Psychiatric:  No agitation   Assessment/Plan: 1. Functional deficits secondary to Deconditioning which require 3+ hours per day of interdisciplinary therapy in a comprehensive inpatient rehab setting. Physiatrist is providing close team supervision and 24 hour management of active medical problems listed below. Physiatrist and rehab team continue to assess barriers to discharge/monitor patient progress toward functional and medical goals. FIM: FIM - Bathing Bathing Steps Patient Completed: Chest;Front perineal area;Right lower leg (including foot);Right Arm;Buttocks;Left lower leg (including foot);Left Arm;Right upper leg;Abdomen;Left upper leg Bathing: 5: Supervision: Safety issues/verbal cues  FIM - Upper Body Dressing/Undressing Upper body dressing/undressing steps patient completed: Thread/unthread right sleeve of pullover shirt/dresss;Thread/unthread left sleeve of pullover shirt/dress;Put head through opening of pull over shirt/dress;Pull shirt over trunk Upper body dressing/undressing: 5: Set-up assist to: Obtain clothing/put away FIM - Lower Body Dressing/Undressing Lower body dressing/undressing steps patient completed: Thread/unthread right underwear leg;Thread/unthread left underwear leg;Pull underwear up/down;Thread/unthread right pants leg;Thread/unthread left pants leg;Pull pants up/down;Fasten/unfasten pants;Don/Doff right sock;Don/Doff left sock Lower body dressing/undressing: 5:  Supervision: Safety issues/verbal cues  FIM - Toileting Toileting steps completed by patient: Adjust clothing prior to toileting;Performs perineal hygiene;Adjust clothing after toileting Toileting Assistive Devices: Grab bar or rail for support Toileting: 5: Supervision: Safety issues/verbal cues  FIM - Radio producer Devices: Grab bars Toilet Transfers: 5-To toilet/BSC: Supervision (verbal cues/safety issues);5-From toilet/BSC: Supervision (verbal cues/safety issues)  FIM - Engineer, site Assistive Devices: Arm rests Bed/Chair Transfer: 6: Supine > Sit: No assist;6: Sit > Supine: No assist;5: Bed > Chair or W/C: Supervision (verbal  cues/safety issues);5: Chair or W/C > Bed: Supervision (verbal cues/safety issues)  FIM - Locomotion: Wheelchair Distance: 120 Locomotion: Wheelchair: 0: Activity did not occur FIM - Locomotion: Ambulation Locomotion: Ambulation Assistive Devices: Other (comment) (no AD) Ambulation/Gait Assistance: 5: Supervision Locomotion: Ambulation: 5: Travels 150 ft or more with supervision/safety issues  Comprehension Comprehension Mode: Auditory Comprehension: 6-Follows complex conversation/direction: With extra time/assistive device  Expression Expression Mode: Verbal Expression: 5-Expresses complex 90% of the time/cues < 10% of the time  Social Interaction Social Interaction: 5-Interacts appropriately 90% of the time - Needs monitoring or encouragement for participation or interaction.  Problem Solving Problem Solving: 5-Solves basic 90% of the time/requires cueing < 10% of the time  Memory Memory: 5-Recognizes or recalls 90% of the time/requires cueing < 10% of the time  Medical Problem List and Plan:  1. Functional deficits secondary to deconditioning related to AAA repair 01/14/2014 /multiple medical issues  2. DVT Prophylaxis/Anticoagulation: Lovenox  3. Pain Management: Tylenol as needed  4.  Mood/schizoaffective disorder: Continue Depakote 1000 mg each bedtime, Prozac 20 mg daily, Haldol 2 mg each bedtime  5. Neuropsych: This patient is capable of making decisions on his own behalf.  6. Acute blood loss anemia. Latest hemoglobin improved 12.2. Followup CBC  7. Chronic renal insufficiency. Baseline creatinine 2.11. Followup chemistries consistent 8. COPD/tobacco abuse/RLL infiltrate.last WBC 13.4 on 6/15, trending down Levaquin initiated 01/20/2014 x7 days. Continue nebulizer as needed, NicoDerm patch. Provide counseling. Resume Spiriva and Dulera as prior to admission  9. Hyperlipidemia. Zocor  10.GERD. Prilosec  11.Hypernatremia .resolved      LOS (Days) 5 A FACE TO FACE EVALUATION WAS PERFORMED  Ruben Blackwell E 01/27/2014, 10:00 AM

## 2014-01-27 NOTE — Progress Notes (Signed)
Social Work [name], LCSW Social Worker Signed  Patient Care Conference Service date: 01/27/2014 12:59 PM  Inpatient RehabilitationTeam Conference and Plan of Care Update Date: 01/27/2014   Time: 10;30 AM     Patient Name: Ruben Blackwell       Medical Record Number: [id number]   Date of Birth: Sep 28, 1955 Sex: Male         Room/Bed: 4W08C/4W08C-01 Payor Info: Payor: MEDICARE / Plan: MEDICARE PART A AND B / Product Type: *No Product type* /   Admitting Diagnosis: AAA REPAIR  DECONDITIONED   Admit Date/Time:  01/22/2014  6:00 PM Admission Comments: No comment available   Primary Diagnosis:  <principal problem not specified> Principal Problem: <principal problem not specified>    Patient Active Problem List     Diagnosis  Date Noted   .  Physical deconditioning  01/22/2014   .  Hypernatremia  01/18/2014   .  Acute on chronic renal failure  01/15/2014   .  Lactic acidosis  01/15/2014   .  Acute respiratory failure with hypoxia  01/14/2014   .  DOE (dyspnea on exertion)  12/23/2013   .  Pulmonary nodule  12/23/2013   .  Barrett's esophagus  11/03/2013   .  Personal history of colonic polyps-adenomas/ssp  11/03/2013   .  Diarrhea  07/30/2013   .  Abdominal aneurysm without mention of rupture  06/22/2013   .  Nausea with vomiting  04/30/2013   .  Unspecified chronic bronchitis  10/09/2012   .  Cough  10/09/2012   .  HTN (hypertension)  06/05/2012   .  Weight loss  08/31/2011   .  Bladder neck obstruction  08/31/2011   .  Right otitis media  08/31/2011   .  Impaired glucose tolerance  08/27/2011   .  Preventative health care  08/27/2011   .  COPD (chronic obstructive pulmonary disease) with emphysema  08/22/2010   .  SKIN LESION  08/22/2010   .  CORONARY ARTERY DISEASE  10/20/2009   .  BIPOLAR DISORDER UNSPECIFIED  05/25/2009   .  PARKINSON'S DISEASE  05/25/2009   .  ABNORMAL STRESS ELECTROCARDIOGRAM  04/20/2009   .  GERD  12/01/2008   .  HYPERTHYROIDISM  08/18/2008    .  DEPRESSION  08/18/2008   .  SMOKER  09/29/2007   .  HYPERLIPIDEMIA  05/20/2007   .  SCHIZOAFFECTIVE DISORDER  05/20/2007   .  TARDIVE DYSKINESIA  05/20/2007   .  PILONIDAL CYST, WITH ABSCESS  05/20/2007   .  FATIGUE  05/20/2007     Expected Discharge Date: Expected Discharge Date: 01/28/14  Team Members Present: Physician leading conference: Dr. Alysia Penna Social Worker Present: Ovidio Kin, LCSW Nurse Present: Elliot Cousin, RN PT Present: Georjean Mode, PT;Blair [name], PT OT Present: Antony Salmon, OT SLP Present: Other (comment);Gunnar Fusi, SLP Elmyra Ricks Page-RN) PPS Coordinator present : Ileana Ladd, Lelan Pons, RN, CRRN        Current Status/Progress  Goal  Weekly Team Focus   Medical     pt progressing well  maintain med stability  d/c   Bowel/Bladder     cont B & B  cont B & B     Swallow/Nutrition/ Hydration     na       ADL's     supervision with increased time/ increased rest breaks  Modified Independent  Improve endurance and balance with functional mobility and ADL   Mobility  Supervision overall  Mod I overall  high level ambulation, dynamic gait training, dual tasking with mobility   Communication     [facility]       Safety/Cognition/ Behavioral Observations    no unsafe behaviors       Pain     managed-no issues       Skin     normal-monitoring         *See Care Plan and progress notes for long and short-term goals.    Barriers to Discharge:  little family support      Possible Resolutions to Barriers:    family to stock kitchen      Discharge Planning/Teaching Needs:    Home with intermittent assist from sister and friend.  Needs to be mod/i to return home      Team Discussion:    Making good progress-reaching goals quickly.  Checking O2 sats in therapy, needs rest breaks and to take his time.    Revisions to Treatment Plan:    Preparing for DC tomorrow    Continued Need for Acute Rehabilitation Level of Care: The  patient requires daily medical management by a physician with specialized training in physical medicine and rehabilitation for the following conditions: Daily direction of a multidisciplinary physical rehabilitation program to ensure safe treatment while eliciting the highest outcome that is of practical value to the patient.: Yes Daily medical management of patient stability for increased activity during participation in an intensive rehabilitation regime.: Yes Daily analysis of laboratory values and/or radiology reports with any subsequent need for medication adjustment of medical intervention for : Neurological problems  [name] 01/27/2014, 12:59 PM          Patient ID: Ruben Blackwell, male   DOB: 06-Jul-1956, 58 y.o.   MRN: [id number]

## 2014-01-27 NOTE — Progress Notes (Signed)
Occupational Therapy Discharge Summary  Patient Details  Name: Ruben Blackwell MRN: [id number] Date of Birth: 27-May-1956  Today's Date: 01/27/2014 Time: 1400-1500 Time Calculation (min): 60 min  Patient has met 9 of 9 long term goals due to improved activity tolerance, improved balance, postural control, ability to compensate for deficits and improved coordination.  Patient to discharge at overall Modified Independent level.  Patient's care partner is available should the patient require assistance, although patient is independent with basic self care skills and simple meal preparation.    Reasons goals not met: NA   Recommendation:  Patient does not require further OT services at this time.  Equipment: bedside commode  Skilled clinical intervention:  Patient seen this am for OT intervention to address self care independence, meal prep, simple housekeeping, and pet care post CIR discharge.  Patient will have family and friends available initially when he returns home.  Patient has been able to problem solve and simulate effective situations to manage care of large cat.  Patient's family is planning to grocery shop for patient, and he is able to prepare simple meals for himself.  Patient is fully prepared for Thursday discharge.   Reasons for discharge: treatment goals met and discharge from hospital  Patient/family agrees with progress made and goals achieved: Yes  OT Discharge Precautions/Restrictions  Precautions Precautions: Fall Restrictions Weight Bearing Restrictions: No   Pain Pain Assessment Pain Assessment: No/denies pain Pain Score: 0-No pain   Vision/Perception  Vision- History Baseline Vision/History: Wears glasses Wears Glasses: At all times Patient Visual Report: No change from baseline (reports needing new prescription ) Vision- Assessment Vision Assessment?: No apparent visual deficits (no new visual deficits)  Cognition Overall Cognitive Status:  History of cognitive impairments - at baseline Arousal/Alertness: Awake/alert Orientation Level: Oriented X4 Attention: Selective Focused Attention: Appears intact Sustained Attention: Appears intact Selective Attention: Appears intact Memory: Appears intact Awareness: Appears intact Problem Solving: Appears intact Safety/Judgment: Appears intact Sensation Sensation Light Touch: Appears Intact Stereognosis: Not tested Hot/Cold: Appears Intact Proprioception: Not tested Coordination Gross Motor Movements are Fluid and Coordinated: No Fine Motor Movements are Fluid and Coordinated: No Coordination and Movement Description: Tardive dyskinesia Motor  Motor Motor - Discharge Observations: Dyskinesia - years of haldol    Trunk/Postural Assessment  Cervical Assessment Cervical Assessment: Exceptions to [facility] (maintains more static head and neck position during ADL) Thoracic Assessment Thoracic Assessment: Exceptions to [facility] (Flat thoracic spine, limited curvature) Lumbar Assessment Lumbar Assessment: Exceptions to [facility] (posterior rotation of pelvis)  Balance Balance Balance Assessed: Yes Extremity/Trunk Assessment RUE Assessment RUE Assessment: Within Functional Limits LUE Assessment LUE Assessment: Within Functional Limits  See FIM for current functional status  Mariah Milling 01/27/2014, 4:16 PM

## 2014-01-27 NOTE — Progress Notes (Addendum)
Physical Therapy Discharge Summary  Patient Details  Name: Ruben Blackwell MRN: [id number] Date of Birth: 04/16/1956  Today's Date: 01/27/2014 Time: 1116-1203 Time Calculation (min): 47 min  Patient has met 7 of 7 long term goals due to improved activity tolerance, improved balance, improved postural control, ability to compensate for deficits and improved coordination.  Patient to discharge at an ambulatory level Modified Independent to Independent.   Patient's care partner is not necessary to provide physical/cognitive assistance at discharge, as pt is discharging at modified independent to independent level.  Reasons goals not met: N/A; all goals met or surpassed.  Recommendation:  Patient will not require follow up physical therapy services after discharge.  Equipment: No equipment provided  Reasons for discharge: treatment goals met and discharge from hospital  Patient/family agrees with progress made and goals achieved: Yes  Skilled Therapeutic Interventions/Progress Updates Pt received seated in w/c; agreeable to therapy. Session focused on assessing/addressing stability and independence with functional mobility. See below for detailed findings of discharge evaluation. Pt completed Dynamic Gait Index, scoring 21/24. Educated pt on findings, progress, and functional implications of score. Pt verbalized understanding. Pt independently ambulated x300' in controlled, home, and community environments without assistive device; no overt LOB noted. Independently negotiated 10 steps with single rail with reciprocal pattern, forward-facing. Negotiated 3 steps without rails (to simulate home entrance) forward-facing with step-to pattern and mod I, increased time. Addendum: car and furniture transfers performed independently. Session ended in pt room, where pt was left seated EOB with all needs within reach.  PT Discharge Precautions/Restrictions Restrictions Weight Bearing Restrictions:  No Pain  Pt reports no pain during PT session. Cognition Overall Cognitive Status: History of cognitive impairments - at baseline Arousal/Alertness: Awake/alert Orientation Level: Oriented X4 Attention: Selective Focused Attention: Appears intact Sustained Attention: Appears intact Selective Attention: Appears intact Memory: Appears intact Awareness: Appears intact Problem Solving: Appears intact Safety/Judgment: Appears intact Sensation Sensation Light Touch: Appears Intact Stereognosis: Not tested Hot/Cold: Appears Intact Proprioception: Not tested Coordination Gross Motor Movements are Fluid and Coordinated: No Fine Motor Movements are Fluid and Coordinated: No Coordination and Movement Description: Tardive dyskinesia Motor  Motor Motor: Within Functional Limits Motor - Discharge Observations: Dyskinesia - years of haldol  Mobility Bed Mobility Bed Mobility: Supine to Sit;Sitting - Scoot to [employer] of Bed;Sit to Supine;Rolling Right;Rolling Left Rolling Right: 7: Independent Rolling Left: 7: Independent Supine to Sit: 7: Independent;HOB flat Sitting - Scoot to Edge of Bed: 7: Independent Sit to Supine: 7: Independent;HOB flat Transfers Transfers: Yes Sit to Stand: 7: Independent Stand to Sit: 7: Independent Locomotion  Ambulation Ambulation: Yes Ambulation/Gait Assistance: 7: Independent Ambulation Distance (Feet): 300 Feet Assistive device: None Gait Gait: Yes Gait Pattern: Impaired Gait Pattern: Trunk flexed Gait velocity: Self-selected gait speed = .89 m/s High Level Ambulation High Level Ambulation: Side stepping;Direction changes;Sudden stops;Head turns Side Stepping: Independent Direction Changes: Requires supervision at times for sudden stop followed by immediate 180-degree turning (see DGI); pt able to compensate (via slow turning) without cues. Sudden Stops: Independent Head Turns: Independent Stairs / Additional Locomotion Stairs: Yes Stairs  Assistance: 7: Independent Stairs Assistance Details (indicate cue type and reason): Independently negotiated 10 steps with single rail with reciprocal pattern, forward-facing. Negotiated 3 steps without rails (to simulate home entrance) forward-facing with step-to pattern and mod I, increased time.  Stair Management Technique: Step to pattern;No rails;Alternating pattern;One rail Right;Forwards Number of Stairs: 10 Ramp: 7: Independent Curb: 7: Development worker, community: Yes Wheelchair Assistance: 5: Supervision  Wheelchair Propulsion: Both lower extermities Wheelchair Parts Management: Supervision/cueing;Independent;Other (comment) (Independent with management of w/c brakes and leg rests; requires cueing for management of arm rests) Distance: 150  Trunk/Postural Assessment  Cervical Assessment Cervical Assessment: Exceptions to [facility] (maintains more static head and neck position during ADL) Thoracic Assessment Thoracic Assessment: Exceptions to [facility] (Flat thoracic spine, limited curvature) Lumbar Assessment Lumbar Assessment: Exceptions to [facility] (posterior rotation of pelvis) Postural Control Postural Control: Within Functional Limits Righting Reactions: Hip, ankle, and stepping strategies effective in balance recovery with AP/PA perturbations.  Balance Balance Balance Assessed: Yes Standardized Balance Assessment Standardized Balance Assessment: Dynamic Gait Index Dynamic Gait Index Level Surface: Normal (.89 m/s) Change in Gait Speed: Normal Gait with Horizontal Head Turns: Mild Impairment Gait with Vertical Head Turns: Mild Impairment Gait and Pivot Turn: Mild Impairment Step Over Obstacle: Normal Step Around Obstacles: Normal Steps: Normal Total Score: 21 Dynamic Sitting Balance Dynamic Sitting - Level of Assistance: 7: Independent Dynamic Standing Balance Dynamic Standing - Balance Support: No upper extremity supported;During functional  activity Dynamic Standing - Level of Assistance: 6: Modified independent (Device/Increase time) Dynamic Standing - Comments: See Dynamiic Gait Index findings above. Extremity Assessment  RUE Assessment RUE Assessment: Within Functional Limits LUE Assessment LUE Assessment: Within Functional Limits RLE Assessment RLE Assessment: Within Functional Limits LLE Assessment LLE Assessment: Within Functional Limits  See FIM for current functional status  [name], [name] 01/27/2014, 9:18 PM

## 2014-01-27 NOTE — Patient Care Conference (Signed)
Inpatient RehabilitationTeam Conference and Plan of Care Update Date: 01/27/2014   Time: 10;30 AM    Patient Name: Ruben Blackwell      Medical Record Number: [id number]  Date of Birth: August 02, 1956 Sex: Male         Room/Bed: 4W08C/4W08C-01 Payor Info: Payor: MEDICARE / Plan: MEDICARE PART A AND B / Product Type: *No Product type* /    Admitting Diagnosis: AAA REPAIR  DECONDITIONED   Admit Date/Time:  01/22/2014  6:00 PM Admission Comments: No comment available   Primary Diagnosis:  <principal problem not specified> Principal Problem: <principal problem not specified>  Patient Active Problem List   Diagnosis Date Noted  . Physical deconditioning 01/22/2014  . Hypernatremia 01/18/2014  . Acute on chronic renal failure 01/15/2014  . Lactic acidosis 01/15/2014  . Acute respiratory failure with hypoxia 01/14/2014  . DOE (dyspnea on exertion) 12/23/2013  . Pulmonary nodule 12/23/2013  . Barrett's esophagus 11/03/2013  . Personal history of colonic polyps-adenomas/ssp 11/03/2013  . Diarrhea 07/30/2013  . Abdominal aneurysm without mention of rupture 06/22/2013  . Nausea with vomiting 04/30/2013  . Unspecified chronic bronchitis 10/09/2012  . Cough 10/09/2012  . HTN (hypertension) 06/05/2012  . Weight loss 08/31/2011  . Bladder neck obstruction 08/31/2011  . Right otitis media 08/31/2011  . Impaired glucose tolerance 08/27/2011  . Preventative health care 08/27/2011  . COPD (chronic obstructive pulmonary disease) with emphysema 08/22/2010  . SKIN LESION 08/22/2010  . CORONARY ARTERY DISEASE 10/20/2009  . BIPOLAR DISORDER UNSPECIFIED 05/25/2009  . PARKINSON'S DISEASE 05/25/2009  . ABNORMAL STRESS ELECTROCARDIOGRAM 04/20/2009  . GERD 12/01/2008  . HYPERTHYROIDISM 08/18/2008  . DEPRESSION 08/18/2008  . SMOKER 09/29/2007  . HYPERLIPIDEMIA 05/20/2007  . 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 TARDIVE DYSKINESIA 05/20/2007  . PILONIDAL CYST, WITH ABSCESS 05/20/2007  . FATIGUE  05/20/2007    Expected Discharge Date: Expected Discharge Date: 01/28/14  Team Members Present: Physician leading conference: Dr. Alysia Penna Social Worker Present: Ovidio Kin, LCSW Nurse Present: Elliot Cousin, RN PT Present: Georjean Mode, PT;Blair [name], PT OT Present: Antony Salmon, OT SLP Present: Other (comment);Gunnar Fusi, SLP Elmyra Ricks Page-RN) PPS Coordinator present : Ileana Ladd, Lelan Pons, RN, [facility]     Current Status/Progress Goal Weekly Team Focus  Medical   pt progressing well  maintain med stability  d/c   Bowel/Bladder     cont B & B   cont B & B     Swallow/Nutrition/ Hydration     na        ADL's   supervision with increased time/ increased rest breaks  Modified Independent  Improve endurance and balance with functional mobility and ADL   Mobility   Supervision overall  Mod I overall  high level ambulation, dynamic gait training, dual tasking with mobility   Communication     [facility]        Safety/Cognition/ Behavioral Observations    no unsafe behaviors        Pain     managed-no issues        Skin     normal-monitoring           *See Care Plan and progress notes for long and short-term goals.  Barriers to Discharge: little family support    Possible Resolutions to Barriers:  family to stock kitchen    Discharge Planning/Teaching Needs:  Home with intermittent assist from sister and friend.  Needs to be mod/i to return home      Team Discussion:  Making  good progress-reaching goals quickly.  Checking O2 sats in therapy, needs rest breaks and to take his time.   Revisions to Treatment Plan:  Preparing for DC tomorrow   Continued Need for Acute Rehabilitation Level of Care: The patient requires daily medical management by a physician with specialized training in physical medicine and rehabilitation for the following conditions: Daily direction of a multidisciplinary physical rehabilitation program to ensure safe treatment while  eliciting the highest outcome that is of practical value to the patient.: Yes Daily medical management of patient stability for increased activity during participation in an intensive rehabilitation regime.: Yes Daily analysis of laboratory values and/or radiology reports with any subsequent need for medication adjustment of medical intervention for : Neurological problems  Dupree, Gardiner Rhyme 01/27/2014, 12:59 PM

## 2014-01-28 MED ORDER — NICOTINE 14 MG/24HR TD PT24: MEDICATED_PATCH | TRANSDERMAL | Status: AC

## 2014-01-28 MED ORDER — FLUOXETINE HCL 20 MG PO CAPS: 20.0000 mg | ORAL_CAPSULE | Freq: Every day | ORAL | Status: AC

## 2014-01-28 MED ORDER — NICOTINE 14 MG/24HR TD PT24
14.0000 mg | MEDICATED_PATCH | Freq: Every day | TRANSDERMAL | Status: DC
  Administered 2014-01-28: 14 mg via TRANSDERMAL
  Filled 2014-01-28 (×2): qty 1

## 2014-01-28 MED ORDER — METOPROLOL TARTRATE 25 MG PO TABS: 25.0000 mg | ORAL_TABLET | Freq: Two times a day (BID) | ORAL | Status: DC

## 2014-01-28 MED ORDER — OMEPRAZOLE 40 MG PO CPDR: 40.0000 mg | DELAYED_RELEASE_CAPSULE | Freq: Every day | ORAL | Status: DC

## 2014-01-28 MED ORDER — DIVALPROEX SODIUM 500 MG PO DR TAB: 1000.0000 mg | DELAYED_RELEASE_TABLET | Freq: Every day | ORAL | Status: AC

## 2014-01-28 MED ORDER — HALOPERIDOL 2 MG PO TABS: 2.0000 mg | ORAL_TABLET | Freq: Every day | ORAL | Status: AC

## 2014-01-28 MED ORDER — SIMVASTATIN 40 MG PO TABS: 40.0000 mg | ORAL_TABLET | Freq: Every day | ORAL | Status: AC

## 2014-01-28 NOTE — Progress Notes (Signed)
Social Work Discharge Note Discharge Note  The overall goal for the admission was met for:   Discharge location: [place]  Length of Stay: Yes-6 DAYS  Discharge activity level: Yes-MOD/I LEVEL  Home/community participation: Yes  Services provided included: MD, RD, PT, OT, RN, CM, TR, Pharmacy and [place]: Medicare  Follow-up services arranged: Home Health: [place] [facility]-PT,OT,RN, DME: [facility]-3 IN 1 and Patient/Family has no preference for HH/DME agencies  Comments (or additional information):PT DID WELL AND REACHED HIS GOALS QUICKLY READY TO [place]  Patient/Family verbalized understanding of follow-up arrangements: Yes  Individual responsible for coordination of the follow-up plan: SELF & BRENDA-SISTER  Confirmed correct DME delivered: [name] 01/28/2014    Dupree, Gardiner Rhyme

## 2014-01-28 NOTE — Progress Notes (Signed)
Subjective/Complaints: No problems overnite  Completed fam ed, sister and bro in law picking him up today  Review of Systems - Negative except weakness    Objective: Vital Signs: Blood pressure 99/64, pulse 71, temperature 97.8 F (36.6 C), temperature source Oral, resp. rate 18, height 5' 8.5" (1.74 m), weight 70.1 kg (154 lb 8.7 oz), SpO2 100.00%. No results found. No results found for this or any previous visit (from the past 72 hour(s)).    Physical Exam   Constitutional: He appears well-developed.   HENT:   Head: Normocephalic.   Eyes: EOM are normal.   Neck: Normal range of motion. Neck supple. No thyromegaly present.   Cardiovascular: Normal rate and regular rhythm.   Respiratory: Effort normal and breath sounds normal. No respiratory distress.   GI: Soft. Bowel sounds are normal. He exhibits no distension.  Neurological: He is alert.  Dyskinesias all 4 limbs.  Follows simple commands. UE grossly 4/5 prox to distally. LE 4/5 HF, KE and 4/5 distally. Sensory function intact. Decreased [place]. A bit distracted. Needed redirection to stay on task  Skin:  Surgical site clean and dry , dermabond Psychiatric:  No agitation   Assessment/Plan: 1. Functional deficits secondary to Deconditioning which require 3+ hours per day of interdisciplinary therapy in a comprehensive inpatient rehab setting.  Stable for D/C today F/u PCP in 1-2 weeks F/u [facility] 3 weeks See D/C summary See D/C instructions FIM: FIM - Bathing Bathing Steps Patient Completed: Chest;Front perineal area;Right lower leg (including foot);Right Arm;Buttocks;Left lower leg (including foot);Left Arm;Right upper leg;Abdomen;Left upper leg Bathing: 7: Complete Independence: No helper  FIM - Upper Body Dressing/Undressing Upper body dressing/undressing steps patient completed: Thread/unthread right sleeve of pullover shirt/dresss;Thread/unthread left sleeve of pullover shirt/dress;Put head through opening of pull  over shirt/dress;Pull shirt over trunk Upper body dressing/undressing: 7: Complete Independence: No helper FIM - Lower Body Dressing/Undressing Lower body dressing/undressing steps patient completed: Thread/unthread right underwear leg;Thread/unthread left underwear leg;Pull underwear up/down;Thread/unthread right pants leg;Thread/unthread left pants leg;Pull pants up/down;Fasten/unfasten pants;Don/Doff right sock;Don/Doff left sock;Don/Doff right shoe;Don/Doff left shoe;Fasten/unfasten right shoe;Fasten/unfasten left shoe Lower body dressing/undressing: 7: Complete Independence: No helper  FIM - Toileting Toileting steps completed by patient: Adjust clothing prior to toileting;Performs perineal hygiene;Adjust clothing after toileting Toileting Assistive Devices: Grab bar or rail for support Toileting: 7: Independent: No helper, no device  FIM - Radio producer Devices: Product manager Transfers: 6-Assistive device: No helper  FIM - Control and instrumentation engineer Devices: Arm rests Bed/Chair Transfer: 7: Independent: No helper  FIM - Locomotion: Wheelchair Distance: 150 Locomotion: Wheelchair: 5: Travels 150 ft or more: maneuvers on rugs and over door sills with supervision, cueing or coaxing FIM - Locomotion: Ambulation Locomotion: Ambulation Assistive Devices: Other (comment) (no AD) Ambulation/Gait Assistance: 7: Independent Locomotion: Ambulation: 7: Travels 150 ft or more independently  Comprehension Comprehension Mode: Auditory Comprehension: 6-Follows complex conversation/direction: With extra time/assistive device  Expression Expression Mode: Verbal Expression: 6-Expresses complex ideas: With extra time/assistive device  Social Interaction Social Interaction: 6-Interacts appropriately with others with medication or extra time (anti-anxiety, antidepressant).  Problem Solving Problem Solving: 5-Solves basic problems: With no  assist  Memory Memory: 6-More than reasonable amt of time  Medical Problem List and Plan:  1. Functional deficits secondary to deconditioning related to AAA repair 01/14/2014 /multiple medical issues  2. DVT Prophylaxis/Anticoagulation: Lovenox  3. Pain Management: Tylenol as needed  4. Mood/schizoaffective disorder: Continue Depakote 1000 mg each bedtime, Prozac 20 mg daily, Haldol 2  mg each bedtime  5. Neuropsych: This patient is capable of making decisions on his own behalf.  6. Acute blood loss anemia. Latest hemoglobin improved 12.2. Followup CBC  7. Chronic renal insufficiency. Baseline creatinine 2.11. Followup chemistries consistent 8. COPD/tobacco abuse/RLL infiltrate.last WBC 13.4 on 6/15, trending down Levaquin initiated 01/20/2014 x7 days. Continue nebulizer as needed, NicoDerm patch. Provide counseling. Resume Spiriva and Dulera as prior to admission  9. Hyperlipidemia. Zocor  10.GERD. Prilosec  11.Hypernatremia .resolved      LOS (Days) 6 A FACE TO FACE EVALUATION WAS PERFORMED  KIRSTEINS,ANDREW E 01/28/2014, 8:33 AM

## 2014-01-28 NOTE — Discharge Instructions (Signed)
Inpatient Rehab Discharge Instructions  Ruben Blackwell Discharge date and time: No discharge date for patient encounter.   Activities/Precautions/ Functional Status: Activity: activity as tolerated Diet: regular diet Wound Care: keep wound clean and dry Functional status:  ___ No restrictions     ___ Walk up steps independently ___ 24/7 supervision/assistance   ___ Walk up steps with assistance ___ Intermittent supervision/assistance  ___ Bathe/dress independently ___ Walk with walker     ___ Bathe/dress with assistance ___ Walk Independently    ___ Shower independently _x__ Walk with assistance    ___ Shower with assistance ___ No alcohol     ___ Return to work/school ________  Special Instructions:    COMMUNITY REFERRALS UPON DISCHARGE:    Home Health:   PT, OT, [place] UUEKC:[phone] Date of last service:01/28/2014  Medical Equipment/Items Ordered:3 IN 1  Agency/Supplier:[facility]    [phone] Other:[employer]    My questions have been answered and I understand these instructions. I will adhere to these goals and the provided educational materials after my discharge from the hospital.  Patient/Caregiver Signature _______________________________ Date __________  Clinician Signature _______________________________________ Date __________  Please bring this form and your medication list with you to all your follow-up doctor's appointments.

## 2014-01-28 NOTE — Progress Notes (Signed)
Pt. Got d/c instructions.Pt. Is ready to go home with his family.

## 2014-01-28 NOTE — Discharge Summary (Signed)
NAME:  Ruben, Blackwell NO.:  [id number]  MEDICAL RECORD NO.:  [id number]  LOCATION:  4W08C                        FACILITY:  [facility]  PHYSICIAN:  Charlett Blake, M.D.DATE OF BIRTH:  1956/05/21  DATE OF ADMISSION:  01/22/2014 DATE OF DISCHARGE:  01/28/2014                              DISCHARGE SUMMARY   DISCHARGE DIAGNOSES: 1. Functional deficits secondary to deconditioning related to triple     abdominal aortic aneurysm repair on January 14, 2014. 2. Subcutaneous Lovenox for deep venous thrombosis prophylaxis. 3. Pain management. 4. Schizoaffective disorder. 5. Acute blood loss anemia. 6. Chronic renal insufficiency. 7. Chronic obstructive pulmonary disease with tobacco abuse. 8. Hyperlipidemia. 9. Gastroesophageal reflux disease. 10.Hypernatremia, resolved.  HISTORY OF PRESENT ILLNESS:  This is a 58 year old right-handed male with history of chronic renal insufficiency with baseline creatinine 2.11, tardive dyskinesia, and schizoaffective disorder who was admitted on January 14, 2014, with a known abdominal aortic aneurysm of 4.7 cm, increased to 6.3 cm within 6 months.  He elected surgical intervention, underwent AAA repair as well as primary repair of umbilical hernia on January 14, 2014, per Dr. Trula Slade.  Postoperative respiratory failure, requiring intubation followed by Critical Care Medicine.  Acute blood loss anemia, transfused, latest hemoglobin 12.2.  Bouts of hypernatremia 166, IV fluids changed to D5 W, latest sodium 143.  Close monitoring of renal insufficiency, latest creatinine 2.87.  Spiked a low-grade fever of 100.5.  Chest x-ray showed persistent right lower lobe infiltrate, initiated Levaquin x7 days.  Maintained on subcutaneous Lovenox for DVT prophylaxis.  Bouts of loose stool, Clostridium specimen for C. diff negative.  Physical and occupational therapy ongoing.  The patient was admitted for comprehensive rehab program.  PAST MEDICAL HISTORY:   See discharge diagnoses.  SOCIAL HISTORY:  Lives alone.  FUNCTIONAL HISTORY:  Prior to admission independent.  Functional status upon admission to rehab services was moderate assist ambulate 60 feet, one-person handheld assist plus 2 physical assist stand pivot transfers, min to mod assist for activities of daily living.  PHYSICAL EXAMINATION:  VITAL SIGNS:  Blood pressure 101/57, pulse 79, temperature 97, respirations 22. GENERAL:  This was an alert male, in no acute distress, oriented x3.  He had a mild tremor to upper extremities, related to tardive dyskinesia. LUNGS:  Clear to auscultation. CARDIAC:  Regular rate and rhythm. ABDOMEN:  Soft, nontender.  Good bowel sounds.  Surgical site healing nicely.  REHABILITATION HOSPITAL COURSE:  Patient was admitted to inpatient rehab services with therapies initiated on a 3-hour daily basis consisting of physical therapy, occupational therapy, and rehabilitation nursing.  The following issues were addressed during the patient's rehabilitation stay.  Pertaining to Ruben Blackwell deconditioning related to AAA repair, surgical site is healing nicely.  He would follow up with Vascular Surgery.  He remained on subcutaneous Lovenox for DVT prophylaxis throughout his rehab course.  Pain management with the use of Tylenol and good results.  He did have a history of schizoaffective disorder, maintained on Depakote, Prozac, as well as Haldol which was a home regimen.  Acute blood loss anemia, no bleeding episodes.  Chronic renal insufficiency with baseline creatinine 2.11 and monitored.  He did have a history of COPD,  tobacco abuse.  Maintained on a NicoDerm patch. He received full counseling in regard to cessation of nicotine products. It was questionable if he would be compliant with this request. Maintained on Zocor for hyperlipidemia.  The patient received weekly collaborative interdisciplinary team conferences to discuss estimated length of  stay, family teaching, and any barriers to discharge.  He was ambulating extended distances, independent level.  Able to gather his belongings for activities of daily living.  Strength and endurance continued to improve.  The patient had a care partner to help provide and assistance in family teaching for his discharge to home.  DISCHARGE MEDICATIONS:  Depakote 1000 mg p.o. at bedtime, Prozac 20 mg p.o. at bedtime, Haldol 2 mg p.o. at bedtime, Lopressor 25 mg p.o. b.i.d., NicoDerm patch 14 mg daily and taper as directed, Protonix 40 mg p.o. daily, Zocor 40 mg p.o. daily.  DIET:  Regular.  SPECIAL INSTRUCTIONS:  The patient would follow up Dr. Alysia Penna at the outpatient rehab service office as needed, Dr. Cathlean Cower medical management, Dr. Harold Barban 2 weeks call for appointment.  Ongoing therapies dictated as per [employer].  The patient is diet was regular.     Lauraine Rinne, P.A.   ______________________________ Charlett Blake, M.D.    DA/MEDQ  D:  01/28/2014  T:  01/28/2014  Job:  [id number]  cc:   Charlett Blake, M.D. Eldridge Abrahams, MD Biagio Borg, MD

## 2014-01-28 NOTE — Discharge Summary (Signed)
Discharge summary job [id number]

## 2014-02-01 ENCOUNTER — Ambulatory Visit (INDEPENDENT_AMBULATORY_CARE_PROVIDER_SITE_OTHER): Payer: Self-pay | Admitting: Surgery

## 2014-02-01 ENCOUNTER — Encounter: Payer: Self-pay | Admitting: Surgery

## 2014-02-01 VITALS — BP 146/129 | HR 82 | Ht 68.5 in | Wt 149.0 lb

## 2014-02-01 DIAGNOSIS — I714 Abdominal aortic aneurysm, without rupture, unspecified: Secondary | ICD-10-CM

## 2014-02-01 NOTE — Addendum Note (Signed)
Addended by: Mena Goes on: 02/01/2014 04:42 PM   Modules accepted: Orders

## 2014-02-01 NOTE — Progress Notes (Signed)
This is the patient's first postoperative followup.  On 01/14/2014, he underwent open repair of infrarenal abdominal aortic aneurysm using a bifurcated 20 x 10 dacryon graft.  He also had primary repair of an umbilical hernia.  The patient had significant pulmonary insufficiency.  He was evaluated preoperatively by pulmonology.  He did require an extra day on the ventilator but was ultimately extubated.  He eventually was discharged to rehabilitation 40 completed an inpatient stay.  This is his first postoperative visit he states that his bowel function has returned to near normal.  He is no longer requiring oral narcotics for pain control.  His activity is still a limited but he is getting around onto the cord.  On examination, his midline incision is healing nicely.  There is no evidence of hernia.  There is no skin separation.  His extremities are warm well perfused.  Overall the patient has made dramatic progress since his discharge.  He is recovering very nicely.  L. plan on scheduling him for followup in 3 months with ankle-brachial indices.  His preoperative carotid studies showed no significant stenosis.  I again stressed the importance of remaining abstinent from cigarettes as well as limiting his caffeinated beverage consumption.

## 2014-02-04 ENCOUNTER — Ambulatory Visit: Payer: Medicare Other | Admitting: Internal Medicine

## 2014-02-04 ENCOUNTER — Telehealth: Payer: Self-pay | Admitting: Internal Medicine

## 2014-02-04 DIAGNOSIS — Z0289 Encounter for other administrative examinations: Secondary | ICD-10-CM

## 2014-02-04 NOTE — Telephone Encounter (Signed)
OK to always call patient to try to re-schedule, and let me know if patient has no showed x 3 for possible d/c from practice  

## 2014-02-04 NOTE — Telephone Encounter (Signed)
Patient no showed for post hospital visit.  Patients next appt with Dr. Jenny Reichmann is 05/04/14.  Please advise.  Thanks!

## 2014-02-08 ENCOUNTER — Encounter: Payer: Medicare Other | Admitting: Surgery

## 2014-02-09 DIAGNOSIS — F319 Bipolar disorder, unspecified: Secondary | ICD-10-CM

## 2014-02-09 DIAGNOSIS — F209 Schizophrenia, unspecified: Secondary | ICD-10-CM

## 2014-02-09 DIAGNOSIS — Z48812 Encounter for surgical aftercare following surgery on the circulatory system: Secondary | ICD-10-CM

## 2014-02-09 DIAGNOSIS — G2401 Drug induced subacute dyskinesia: Secondary | ICD-10-CM

## 2014-03-30 ENCOUNTER — Telehealth: Payer: Self-pay | Admitting: *Deleted

## 2014-03-30 MED ORDER — METOPROLOL TARTRATE 25 MG PO TABS: 25.0000 mg | ORAL_TABLET | Freq: Two times a day (BID) | ORAL | Status: DC

## 2014-03-30 NOTE — Telephone Encounter (Signed)
Left msg on triage needing refill on metoprolol. Notified pt rx sent to [facility]...lmb

## 2014-04-26 ENCOUNTER — Encounter (HOSPITAL_COMMUNITY): Payer: Self-pay | Admitting: Emergency Medicine

## 2014-04-26 ENCOUNTER — Emergency Department (HOSPITAL_COMMUNITY)
Admission: EM | Admit: 2014-04-26 | Discharge: 2014-04-26 | Disposition: A | Discharge status: Home / Self Care | Payer: Medicare Other | Attending: Emergency Medicine | Admitting: Emergency Medicine

## 2014-04-26 DIAGNOSIS — Z9889 Other specified postprocedural states: Secondary | ICD-10-CM | POA: Insufficient documentation

## 2014-04-26 DIAGNOSIS — J4489 Other specified chronic obstructive pulmonary disease: Secondary | ICD-10-CM | POA: Insufficient documentation

## 2014-04-26 DIAGNOSIS — F3289 Other specified depressive episodes: Secondary | ICD-10-CM | POA: Insufficient documentation

## 2014-04-26 DIAGNOSIS — N189 Chronic kidney disease, unspecified: Secondary | ICD-10-CM

## 2014-04-26 DIAGNOSIS — J449 Chronic obstructive pulmonary disease, unspecified: Secondary | ICD-10-CM | POA: Diagnosis not present

## 2014-04-26 DIAGNOSIS — F411 Generalized anxiety disorder: Secondary | ICD-10-CM | POA: Insufficient documentation

## 2014-04-26 DIAGNOSIS — Z87891 Personal history of nicotine dependence: Secondary | ICD-10-CM | POA: Insufficient documentation

## 2014-04-26 DIAGNOSIS — E785 Hyperlipidemia, unspecified: Secondary | ICD-10-CM | POA: Diagnosis not present

## 2014-04-26 DIAGNOSIS — F259 Schizoaffective disorder, unspecified: Secondary | ICD-10-CM | POA: Diagnosis not present

## 2014-04-26 DIAGNOSIS — R112 Nausea with vomiting, unspecified: Secondary | ICD-10-CM | POA: Diagnosis not present

## 2014-04-26 DIAGNOSIS — IMO0002 Reserved for concepts with insufficient information to code with codable children: Secondary | ICD-10-CM | POA: Diagnosis not present

## 2014-04-26 DIAGNOSIS — I129 Hypertensive chronic kidney disease with stage 1 through stage 4 chronic kidney disease, or unspecified chronic kidney disease: Secondary | ICD-10-CM | POA: Diagnosis not present

## 2014-04-26 DIAGNOSIS — N183 Chronic kidney disease, stage 3 unspecified: Secondary | ICD-10-CM | POA: Insufficient documentation

## 2014-04-26 DIAGNOSIS — F329 Major depressive disorder, single episode, unspecified: Secondary | ICD-10-CM | POA: Insufficient documentation

## 2014-04-26 DIAGNOSIS — I2510 Atherosclerotic heart disease of native coronary artery without angina pectoris: Secondary | ICD-10-CM | POA: Insufficient documentation

## 2014-04-26 DIAGNOSIS — R197 Diarrhea, unspecified: Secondary | ICD-10-CM | POA: Insufficient documentation

## 2014-04-26 DIAGNOSIS — Z860100 Personal history of colon polyps, unspecified: Secondary | ICD-10-CM | POA: Insufficient documentation

## 2014-04-26 DIAGNOSIS — K219 Gastro-esophageal reflux disease without esophagitis: Secondary | ICD-10-CM | POA: Insufficient documentation

## 2014-04-26 DIAGNOSIS — Z79899 Other long term (current) drug therapy: Secondary | ICD-10-CM | POA: Insufficient documentation

## 2014-04-26 LAB — CBC WITH DIFFERENTIAL/PLATELET
BASOS ABS: 0.1 10*3/uL (ref 0.0–0.1)
Basophils Relative: 1 % (ref 0–1)
Eosinophils Absolute: 0.2 10*3/uL (ref 0.0–0.7)
Eosinophils Relative: 2 % (ref 0–5)
HEMATOCRIT: 30.5 % — AB (ref 39.0–52.0)
HEMOGLOBIN: 10.5 g/dL — AB (ref 13.0–17.0)
LYMPHS PCT: 27 % (ref 12–46)
Lymphs Abs: 2.4 10*3/uL (ref 0.7–4.0)
MCH: 33.4 pg (ref 26.0–34.0)
MCHC: 34.4 g/dL (ref 30.0–36.0)
MCV: 97.1 fL (ref 78.0–100.0)
MONOS PCT: 17 % — AB (ref 3–12)
Monocytes Absolute: 1.6 10*3/uL — ABNORMAL HIGH (ref 0.1–1.0)
NEUTROS ABS: 4.9 10*3/uL (ref 1.7–7.7)
NEUTROS PCT: 53 % (ref 43–77)
Platelets: 293 10*3/uL (ref 150–400)
RBC: 3.14 MIL/uL — ABNORMAL LOW (ref 4.22–5.81)
RDW: 13.7 % (ref 11.5–15.5)
WBC: 9.1 10*3/uL (ref 4.0–10.5)

## 2014-04-26 LAB — URINALYSIS, ROUTINE W REFLEX MICROSCOPIC
Bilirubin Urine: NEGATIVE
GLUCOSE, UA: NEGATIVE mg/dL
Hgb urine dipstick: NEGATIVE
Ketones, ur: NEGATIVE mg/dL
Leukocytes, UA: NEGATIVE
Nitrite: NEGATIVE
PH: 6 (ref 5.0–8.0)
Protein, ur: 30 mg/dL — AB
Specific Gravity, Urine: 1.008 (ref 1.005–1.030)
Urobilinogen, UA: 0.2 mg/dL (ref 0.0–1.0)

## 2014-04-26 LAB — COMPREHENSIVE METABOLIC PANEL
ALBUMIN: 3.4 g/dL — AB (ref 3.5–5.2)
ALT: 14 U/L (ref 0–53)
AST: 18 U/L (ref 0–37)
Alkaline Phosphatase: 58 U/L (ref 39–117)
Anion gap: 14 (ref 5–15)
BUN: 55 mg/dL — AB (ref 6–23)
CHLORIDE: 94 meq/L — AB (ref 96–112)
CO2: 24 meq/L (ref 19–32)
Calcium: 9.2 mg/dL (ref 8.4–10.5)
Creatinine, Ser: 2.64 mg/dL — ABNORMAL HIGH (ref 0.50–1.35)
GFR calc Af Amer: 29 mL/min — ABNORMAL LOW (ref >90)
GFR calc non Af Amer: 25 mL/min — ABNORMAL LOW (ref >90)
Glucose, Bld: 90 mg/dL (ref 70–99)
Potassium: 4.5 mEq/L (ref 3.7–5.3)
Sodium: 132 mEq/L — ABNORMAL LOW (ref 137–147)
Total Protein: 7.6 g/dL (ref 6.0–8.3)

## 2014-04-26 LAB — VALPROIC ACID LEVEL: Valproic Acid Lvl: 46.4 ug/mL — ABNORMAL LOW (ref 50.0–100.0)

## 2014-04-26 LAB — URINE MICROSCOPIC-ADD ON: Urine-Other: NONE SEEN

## 2014-04-26 MED ORDER — SODIUM CHLORIDE 0.9 % IV BOLUS (SEPSIS)
1000.0000 mL | Freq: Once | INTRAVENOUS | Status: AC
  Administered 2014-04-26: 1000 mL via INTRAVENOUS

## 2014-04-26 MED ORDER — LOPERAMIDE HCL 2 MG PO CAPS: 2.0000 mg | ORAL_CAPSULE | Freq: Four times a day (QID) | ORAL | Status: AC | PRN

## 2014-04-26 NOTE — ED Provider Notes (Signed)
Medical screening examination/treatment/procedure(s) were performed by non-physician practitioner and as supervising physician I was immediately available for consultation/collaboration.   EKG Interpretation None       Charlesetta Shanks, MD 04/26/14 [id number]

## 2014-04-26 NOTE — Discharge Instructions (Signed)
Please read and follow all provided instructions.  Your diagnoses today include:  1. Nausea vomiting and diarrhea   2. Chronic renal failure, unspecified stage     Tests performed today include:  Blood counts and electrolytes  Blood tests to check liver and kidney function - slightly high kidney function  Urine test to look for infection  Depakote level - normal  Vital signs. See below for your results today.   Medications prescribed:   Imodium - medication for diarrhea  Take any prescribed medications only as directed.  Home care instructions:   Follow any educational materials contained in this packet.  Follow-up instructions: Please follow-up with your primary care provider in the next 3 days for further evaluation of your symptoms.    Return instructions:  SEEK IMMEDIATE MEDICAL ATTENTION IF:  The pain does not go away or becomes severe   A temperature above 101F develops   Repeated vomiting occurs (multiple episodes)   The pain becomes localized to portions of the abdomen. The right side could possibly be appendicitis. In an adult, the left lower portion of the abdomen could be colitis or diverticulitis.   Blood is being passed in stools or vomit (bright red or black tarry stools)   You develop chest pain, difficulty breathing, dizziness or fainting, or become confused, poorly responsive, or inconsolable (young children)  If you have any other emergent concerns regarding your health  Additional Information: Abdominal (belly) pain can be caused by many things. Your caregiver performed an examination and possibly ordered blood/urine tests and imaging (CT scan, x-rays, ultrasound). Many cases can be observed and treated at home after initial evaluation in the emergency department. Even though you are being discharged home, abdominal pain can be unpredictable. Therefore, you need a repeated exam if your pain does not resolve, returns, or worsens. Most patients with  abdominal pain don't have to be admitted to the hospital or have surgery, but serious problems like appendicitis and gallbladder attacks can start out as nonspecific pain. Many abdominal conditions cannot be diagnosed in one visit, so follow-up evaluations are very important.  Your vital signs today were: BP 211/161   Pulse 78   Temp(Src) 98.4 F (36.9 C) (Oral)   Resp 13   SpO2 96% If your blood pressure (bp) was elevated above 135/85 this visit, please have this repeated by your doctor within one month. --------------

## 2014-04-26 NOTE — ED Notes (Signed)
Patient pulled out his IV. I removed tape and  Place dressing over site. Bleeding controled

## 2014-04-26 NOTE — ED Notes (Signed)
Pt stated he is unable to void at this time has a urinal

## 2014-04-26 NOTE — ED Provider Notes (Signed)
CSN: [id number]     Arrival date & time 04/26/14  1540 History   First MD Initiated Contact with Patient 04/26/14 1546     Chief Complaint  Patient presents with  . Emesis  . Diarrhea  . Nausea     (Consider location/radiation/quality/duration/timing/severity/associated sxs/prior Treatment) HPI Comments: Pt with h/o tardive dyskinesia, schizoaffective disorder, AAA repair 01/2014, CKD -- presents with c/o diarrhea, generalized weakness for 2-3 days. Pt had some vomiting prior to this. Diarrhea is watery, non-bloody, occuring several times per day. No fever or abdominal pain. No recent antibiotic use. No CP or SOB. No urinary symptoms. Pt eating and drinking at home but reports less than usual.   Patient is a 58 y.o. male presenting with vomiting and diarrhea. The history is provided by the patient and medical records.  Emesis Associated symptoms: diarrhea   Associated symptoms: no abdominal pain, no headaches, no myalgias and no sore throat   Diarrhea Associated symptoms: vomiting (resolved)   Associated symptoms: no abdominal pain, no fever, no headaches and no myalgias     Past Medical History  Diagnosis Date  . Impaired glucose tolerance 08/27/2011  . BIPOLAR DISORDER UNSPECIFIED 05/25/2009  . COPD 08/22/2010  . CORONARY ARTERY DISEASE 10/20/2009  . HYPERLIPIDEMIA 05/20/2007  . PARKINSON'S DISEASE 05/25/2009  . 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 TARDIVE DYSKINESIA 05/20/2007  . DEPRESSION 08/18/2008  . GERD 12/01/2008  . HYPERTHYROIDISM 08/18/2008  . Unspecified chronic bronchitis 10/09/2012  . Peripheral vascular disease   . Barrett's esophagus 10/26/13    Dx EGD 10/2013  . Personal history of colonic polyps-adenomas/ssp 10/26/13  . Diverticulosis 10/26/13    mild  . Shortness of breath   . Hypertension     Pt reports that Dr. Cathlean Cower placed him on BP meds temporarily but no longer takes.  Marland Kitchen PONV (postoperative nausea and vomiting)     Pt reports nausea only after cardiac  cath  . Anxiety   . CKD (chronic kidney disease), stage III   . Cancer     "precancer; my colon and esophagus" (01/15/2014)  . AAA (abdominal aortic aneurysm)   . History of blood transfusion 01/14/2014    related to AAA repair/notes 01/15/2014   Past Surgical History  Procedure Laterality Date  . Pilonidal cyst / sinus excision    . Esophagogastroduodenoscopy    . Colonoscopy w/ biopsies    . Abdominal aortic aneurysm repair N/A 01/14/2014    Procedure: ABDOMINAL AORTIC ANEURYSM WITH BI ILIAC REPAIR; PRIMARY REPAIR OF UMBILICAL HERNIA;  Surgeon: Serafina Mitchell, MD;  Location: [facility];  Service: Vascular;  Laterality: N/A;  . Hernia repair      "belly"  . Cardiac catheterization  06-16-2009    Dr. Johnsie Cancel  . Esophageal dilation      "once"   Family History  Problem Relation Age of Onset  . Hyperlipidemia Mother   . Hypertension Mother   . Varicose Veins Mother   . Diabetes Father   . Heart disease Father     before age 54  . Heart attack Father   . Cancer Sister   . Hyperlipidemia Sister   . Hypertension Sister   . Cancer Brother   . Diabetes Brother   . Hyperlipidemia Brother   . Hypertension Brother    History  Substance Use Topics  . Smoking status: Former Smoker -- 2.50 packs/day for 40 years    Types: Cigarettes    Quit date: 12/30/2013  . Smokeless tobacco: Never Used  .  Alcohol Use: Yes     Comment: 01/15/2014 "drink very little"    Review of Systems  Constitutional: Negative for fever.  HENT: Negative for rhinorrhea and sore throat.   Eyes: Negative for redness.  Respiratory: Negative for cough.   Cardiovascular: Negative for chest pain.  Gastrointestinal: Positive for nausea, vomiting (resolved) and diarrhea. Negative for abdominal pain.  Genitourinary: Negative for dysuria.  Musculoskeletal: Negative for myalgias.  Skin: Negative for rash.  Neurological: Negative for headaches.    Allergies  Sulfonamide derivatives  Home Medications   Prior to  Admission medications   Medication Sig Start Date End Date Taking? Authorizing Provider  divalproex (DEPAKOTE) 500 MG DR tablet Take 2 tablets (1,000 mg total) by mouth at bedtime. 01/28/14   Lavon Paganini Angiulli, PA-C  FLUoxetine (PROZAC) 20 MG capsule Take 1 capsule (20 mg total) by mouth at bedtime. 01/28/14   Lavon Paganini Angiulli, PA-C  haloperidol (HALDOL) 2 MG tablet Take 1 tablet (2 mg total) by mouth at bedtime. 01/28/14   Lavon Paganini Angiulli, PA-C  metoprolol tartrate (LOPRESSOR) 25 MG tablet Take 1 tablet (25 mg total) by mouth 2 (two) times daily. 03/30/14   Biagio Borg, MD  mometasone-formoterol (DULERA) 100-5 MCG/ACT AERO Inhale 2 puffs into the lungs 2 (two) times daily.    Historical Provider, MD  nicotine (NICODERM CQ - DOSED IN MG/24 HOURS) 14 mg/24hr patch 14 mg patch daily x 3 weeks then 7 mg patch daily for 3 weeks and stop 01/28/14   Lavon Paganini Angiulli, PA-C  omeprazole (PRILOSEC) 40 MG capsule Take 1 capsule (40 mg total) by mouth daily before supper. 01/28/14   Lavon Paganini Angiulli, PA-C  simvastatin (ZOCOR) 40 MG tablet Take 1 tablet (40 mg total) by mouth at bedtime. 01/28/14   Lavon Paganini Angiulli, PA-C  tiotropium (SPIRIVA) 18 MCG inhalation capsule Place 18 mcg into inhaler and inhale daily.    Historical Provider, MD   BP 156/130  Pulse 80  Temp(Src) 99.3 F (37.4 C) (Oral)  Resp 20  SpO2 94%  Physical Exam  Nursing note and vitals reviewed. Constitutional: He appears well-developed and well-nourished.  HENT:  Head: Normocephalic and atraumatic.  Mouth/Throat: Oropharynx is clear and moist.  Mildly dry mucous membranes  Eyes: Conjunctivae are normal. Right eye exhibits no discharge. Left eye exhibits no discharge.  Neck: Normal range of motion. Neck supple.  Cardiovascular: Normal rate, regular rhythm and normal heart sounds.   Pulmonary/Chest: Effort normal and breath sounds normal. No respiratory distress. He has no wheezes. He has no rales.  Abdominal: Soft. Bowel sounds are  normal. There is no tenderness. There is no rebound and no guarding.  Musculoskeletal: He exhibits no edema and no tenderness.  Neurological: He is alert.  Baseline tremor  Skin: Skin is warm and dry.  Punctate rash bilateral upper extremities.   Psychiatric: He has a normal mood and affect.    ED Course  Procedures (including critical care time) Labs Review Labs Reviewed  CBC WITH DIFFERENTIAL - Abnormal; Notable for the following:    RBC 3.14 (*)    Hemoglobin 10.5 (*)    HCT 30.5 (*)    Monocytes Relative 17 (*)    Monocytes Absolute 1.6 (*)    All other components within normal limits  COMPREHENSIVE METABOLIC PANEL - Abnormal; Notable for the following:    Sodium 132 (*)    Chloride 94 (*)    BUN 55 (*)    Creatinine, Ser 2.64 (*)  Albumin 3.4 (*)    Total Bilirubin <0.2 (*)    GFR calc non Af Amer 25 (*)    GFR calc Af Amer 29 (*)    All other components within normal limits  URINALYSIS, ROUTINE W REFLEX MICROSCOPIC - Abnormal; Notable for the following:    Protein, ur 30 (*)    All other components within normal limits  VALPROIC ACID LEVEL - Abnormal; Notable for the following:    Valproic Acid Lvl 46.4 (*)    All other components within normal limits  URINE MICROSCOPIC-ADD ON    Imaging Review No results found.   EKG Interpretation None      3:58 PM Patient seen and examined. Work-up initiated. Fluids ordered.   Vital signs reviewed and are as follows: BP 156/130  Pulse 80  Temp(Src) 99.3 F (37.4 C) (Oral)  Resp 20  SpO2 94%  Work-up complete. Only significant lab abnormality was slight acute on chronic AKI, hyponatremia/hypochloridemia. Patient treated for mild dehydration with 2L NS. Depakote is normal.   Patient has done well in ED. One episode of diarrhea here. He has eaten, drank, and ambulated without difficulty. Feel patient is safe for discharge to home. Will give imodium for diarrhea.   Patient urged to return with worsening symptoms or  other concerns. Patient verbalized understanding and agrees with plan.    MDM   Final diagnoses:  Nausea vomiting and diarrhea  Chronic renal failure, unspecified stage   Pt with N/V/D, mostly diarrhea. Labs show mild dehydration, patient does not appear severely dehydrated on exam. Pt treated with IV fluids and has done well in ED. Eating and drinking well here. Will d/c to home with supportive therapy and strict return instructions. Abd is soft, NT. No obstruction or other complication from recent AAA repair suspected.     Carlisle Cater, PA-C 04/26/14 [phone]

## 2014-04-26 NOTE — ED Notes (Signed)
Per EMS- N, V x3 days. No longer having emesis but not having uncontrollable diarrhea. Reports weakness and dehydration. Not able to keep fluids down. No medications taken. 20G PIV in left hand. VS: 120/70 HR 80s.

## 2014-04-30 ENCOUNTER — Encounter: Payer: Self-pay | Admitting: Surgery

## 2014-04-30 ENCOUNTER — Encounter: Payer: Self-pay | Admitting: Internal Medicine

## 2014-04-30 ENCOUNTER — Ambulatory Visit (INDEPENDENT_AMBULATORY_CARE_PROVIDER_SITE_OTHER): Payer: Medicare Other | Admitting: Internal Medicine

## 2014-04-30 VITALS — BP 120/72 | HR 75 | Temp 98.4°F | Wt 159.1 lb

## 2014-04-30 DIAGNOSIS — I10 Essential (primary) hypertension: Secondary | ICD-10-CM

## 2014-04-30 DIAGNOSIS — I2510 Atherosclerotic heart disease of native coronary artery without angina pectoris: Secondary | ICD-10-CM

## 2014-04-30 DIAGNOSIS — Z23 Encounter for immunization: Secondary | ICD-10-CM

## 2014-04-30 DIAGNOSIS — R7309 Other abnormal glucose: Secondary | ICD-10-CM

## 2014-04-30 DIAGNOSIS — E782 Mixed hyperlipidemia: Secondary | ICD-10-CM

## 2014-04-30 DIAGNOSIS — R7302 Impaired glucose tolerance (oral): Secondary | ICD-10-CM

## 2014-04-30 NOTE — Progress Notes (Signed)
Subjective:    Patient ID: Ruben Blackwell, male    DOB: 1955/12/08, 58 y.o.   MRN: [id number]  HPI  Here to f/u,  Seen at ER sept 14, diarrhea now resovled. Trying to cutting back on smoking, now back on patch to help quit. Pt denies chest pain, increased sob or doe, wheezing, orthopnea, PND, increased LE swelling, palpitations, dizziness or syncope.  Pt denies new neurological symptoms such as new headache, or facial or extremity weakness or numbness   Pt denies polydipsia, polyuria, Denies worsening reflux, abd pain, dysphagia, n/v, bowel change or blood. Past Medical History  Diagnosis Date  . Impaired glucose tolerance 08/27/2011  . BIPOLAR DISORDER UNSPECIFIED 05/25/2009  . COPD 08/22/2010  . CORONARY ARTERY DISEASE 10/20/2009  . HYPERLIPIDEMIA 05/20/2007  . PARKINSON'S DISEASE 05/25/2009  . 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 TARDIVE DYSKINESIA 05/20/2007  . DEPRESSION 08/18/2008  . GERD 12/01/2008  . HYPERTHYROIDISM 08/18/2008  . Unspecified chronic bronchitis 10/09/2012  . Peripheral vascular disease   . Barrett's esophagus 10/26/13    Dx EGD 10/2013  . Personal history of colonic polyps-adenomas/ssp 10/26/13  . Diverticulosis 10/26/13    mild  . Shortness of breath   . Hypertension     Pt reports that Dr. Cathlean Cower placed him on BP meds temporarily but no longer takes.  Marland Kitchen PONV (postoperative nausea and vomiting)     Pt reports nausea only after cardiac cath  . Anxiety   . CKD (chronic kidney disease), stage III   . Cancer     "precancer; my colon and esophagus" (01/15/2014)  . AAA (abdominal aortic aneurysm)   . History of blood transfusion 01/14/2014    related to AAA repair/notes 01/15/2014   Past Surgical History  Procedure Laterality Date  . Pilonidal cyst / sinus excision    . Esophagogastroduodenoscopy    . Colonoscopy w/ biopsies    . Abdominal aortic aneurysm repair N/A 01/14/2014    Procedure: ABDOMINAL AORTIC ANEURYSM WITH BI ILIAC REPAIR; PRIMARY REPAIR OF UMBILICAL  HERNIA;  Surgeon: Serafina Mitchell, MD;  Location: [facility];  Service: Vascular;  Laterality: N/A;  . Hernia repair      "belly"  . Cardiac catheterization  06-16-2009    Dr. Johnsie Cancel  . Esophageal dilation      "once"    reports that he quit smoking about 4 months ago. His smoking use included Cigarettes. He has a 100 pack-year smoking history. He has never used smokeless tobacco. He reports that he drinks alcohol. He reports that he does not use illicit drugs. family history includes Cancer in his brother and sister; Diabetes in his brother and father; Heart attack in his father; Heart disease in his father; Hyperlipidemia in his brother, mother, and sister; Hypertension in his brother, mother, and sister; Varicose Veins in his mother. Allergies  Allergen Reactions  . Sulfonamide Derivatives     REACTION: Reaction not known   Current Outpatient Prescriptions on File Prior to Visit  Medication Sig Dispense Refill  . divalproex (DEPAKOTE) 500 MG DR tablet Take 2 tablets (1,000 mg total) by mouth at bedtime.  60 tablet  1  . FLUoxetine (PROZAC) 20 MG capsule Take 1 capsule (20 mg total) by mouth at bedtime.  30 capsule  3  . haloperidol (HALDOL) 2 MG tablet Take 1 tablet (2 mg total) by mouth at bedtime.  30 tablet  1  . loperamide (IMODIUM) 2 MG capsule Take 1 capsule (2 mg total) by mouth 4 (  four) times daily as needed for diarrhea or loose stools.  12 capsule  0  . metoprolol tartrate (LOPRESSOR) 25 MG tablet Take 1 tablet (25 mg total) by mouth 2 (two) times daily.  60 tablet  5  . mometasone-formoterol (DULERA) 100-5 MCG/ACT AERO Inhale 2 puffs into the lungs 2 (two) times daily.      . nicotine (NICODERM CQ - DOSED IN MG/24 HOURS) 14 mg/24hr patch 14 mg patch daily x 3 weeks then 7 mg patch daily for 3 weeks and stop  28 patch  0  . omeprazole (PRILOSEC) 40 MG capsule Take 1 capsule (40 mg total) by mouth daily before supper.  30 capsule  11  . simvastatin (ZOCOR) 40 MG tablet Take 1 tablet  (40 mg total) by mouth at bedtime.  30 tablet  1  . tiotropium (SPIRIVA) 18 MCG inhalation capsule Place 18 mcg into inhaler and inhale daily.       No current facility-administered medications on file prior to visit.    Review of Systems  Constitutional: Negative for unusual diaphoresis or other sweats  HENT: Negative for ringing in ear Eyes: Negative for double vision or worsening visual disturbance.  Respiratory: Negative for choking and stridor.   Gastrointestinal: Negative for vomiting or other signifcant bowel change Genitourinary: Negative for hematuria or decreased urine volume.  Musculoskeletal: Negative for other MSK pain or swelling Skin: Negative for color change and worsening wound.  Neurological: Negative for tremors and numbness other than noted  Psychiatric/Behavioral: Negative for decreased concentration or agitation other than above       Objective:   Physical Exam BP 120/72  Pulse 75  Temp(Src) 98.4 F (36.9 C) (Oral)  Wt 159 lb 2 oz (72.179 kg)  SpO2 96% VS noted,  Constitutional: Pt appears well-developed, well-nourished.  HENT: Head: NCAT.  Right Ear: External ear normal.  Left Ear: External ear normal.  Eyes: . Pupils are equal, round, and reactive to light. Conjunctivae and EOM are normal Neck: Normal range of motion. Neck supple.  Cardiovascular: Normal rate and regular rhythm.   Pulmonary/Chest: Effort normal and breath sounds normal.  Abd:  Soft, NT, ND, + BS Neurological: Pt is alert. Not confused , motor grossly intact Skin: Skin is warm. No rash Psychiatric: Pt behavior is normal. No agitation.     Assessment & Plan:

## 2014-04-30 NOTE — Progress Notes (Signed)
Pre visit review using our clinic review tool, if applicable. No additional management support is needed unless otherwise documented below in the visit note. 

## 2014-04-30 NOTE — Patient Instructions (Addendum)
You had the flu shot today  Please continue all other medications as before, and refills have been done if requested.  Please have the pharmacy call with any other refills you may need.  Please continue your efforts at being more active, low cholesterol diet, and weight control.  You are otherwise up to date with prevention measures today.  Please keep your appointments with your specialists as you may have planned  No further blood work needed today  Please return in 6 months, or sooner if needed

## 2014-05-01 NOTE — Assessment & Plan Note (Signed)
stable overall by history and exam, recent data reviewed with pt, and pt to continue medical treatment as before,  to f/u any worsening symptoms or concerns Lab Results  Component Value Date   HGBA1C 5.7 08/31/2011

## 2014-05-01 NOTE — Assessment & Plan Note (Signed)
stable overall by history and exam, recent data reviewed with pt, and pt to continue medical treatment as before,  to f/u any worsening symptoms or concerns Lab Results  Component Value Date   LDLCALC 99 11/04/2013

## 2014-05-01 NOTE — Assessment & Plan Note (Signed)
stable overall by history and exam, recent data reviewed with pt, and pt to continue medical treatment as before,  to f/u any worsening symptoms or concerns BP Readings from Last 3 Encounters:  04/30/14 120/72  04/26/14 140/100  02/01/14 146/129

## 2014-05-03 ENCOUNTER — Ambulatory Visit (HOSPITAL_COMMUNITY)
Admission: RE | Admit: 2014-05-03 | Discharge: 2014-05-03 | Disposition: A | Discharge status: Home / Self Care | Payer: Medicare Other | Source: Ambulatory Visit | Attending: Surgery | Admitting: Surgery

## 2014-05-03 ENCOUNTER — Encounter: Payer: Self-pay | Admitting: Surgery

## 2014-05-03 ENCOUNTER — Ambulatory Visit (INDEPENDENT_AMBULATORY_CARE_PROVIDER_SITE_OTHER): Payer: Medicare Other | Admitting: Surgery

## 2014-05-03 VITALS — BP 102/69 | HR 68 | Ht 68.5 in | Wt 160.0 lb

## 2014-05-03 DIAGNOSIS — G20A1 Parkinson's disease without dyskinesia, without mention of fluctuations: Secondary | ICD-10-CM | POA: Insufficient documentation

## 2014-05-03 DIAGNOSIS — F17200 Nicotine dependence, unspecified, uncomplicated: Secondary | ICD-10-CM | POA: Insufficient documentation

## 2014-05-03 DIAGNOSIS — Z538 Procedure and treatment not carried out for other reasons: Secondary | ICD-10-CM | POA: Diagnosis not present

## 2014-05-03 DIAGNOSIS — I714 Abdominal aortic aneurysm, without rupture, unspecified: Secondary | ICD-10-CM | POA: Insufficient documentation

## 2014-05-03 DIAGNOSIS — I739 Peripheral vascular disease, unspecified: Secondary | ICD-10-CM | POA: Insufficient documentation

## 2014-05-03 DIAGNOSIS — E785 Hyperlipidemia, unspecified: Secondary | ICD-10-CM | POA: Diagnosis not present

## 2014-05-03 DIAGNOSIS — G20 Parkinson's disease: Secondary | ICD-10-CM | POA: Diagnosis not present

## 2014-05-03 DIAGNOSIS — I2510 Atherosclerotic heart disease of native coronary artery without angina pectoris: Secondary | ICD-10-CM

## 2014-05-03 DIAGNOSIS — R911 Solitary pulmonary nodule: Secondary | ICD-10-CM

## 2014-05-03 NOTE — Addendum Note (Signed)
Addended by: Mena Goes on: 05/03/2014 05:53 PM   Modules accepted: Orders

## 2014-05-03 NOTE — Progress Notes (Signed)
Patient name: Ruben Blackwell MRN: [id number] DOB: [birth year] Sex: male     Chief Complaint  Patient presents with  . Re-evaluation    3 month f/u     HISTORY OF PRESENT ILLNESS: This is the patient's first postoperative followup. On 01/14/2014, he underwent open repair of infrarenal abdominal aortic aneurysm using a bifurcated 20 x 10 dacryon graft. He also had primary repair of an umbilical hernia. The patient had significant pulmonary insufficiency. He was evaluated preoperatively by pulmonology. He did require an extra day on the ventilator but was ultimately extubated.  He has had no issues  last saw him.  He did state that he started smoking again and smoked a total of 6 packs before going back onto the patch.  He is not smoking currently.  Past Medical History  Diagnosis Date  . Impaired glucose tolerance 08/27/2011  . BIPOLAR DISORDER UNSPECIFIED 05/25/2009  . COPD 08/22/2010  . CORONARY ARTERY DISEASE 10/20/2009  . HYPERLIPIDEMIA 05/20/2007  . PARKINSON'S DISEASE 05/25/2009  . 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 TARDIVE DYSKINESIA 05/20/2007  . DEPRESSION 08/18/2008  . GERD 12/01/2008  . HYPERTHYROIDISM 08/18/2008  . Unspecified chronic bronchitis 10/09/2012  . Peripheral vascular disease   . Barrett's esophagus 10/26/13    Dx EGD 10/2013  . Personal history of colonic polyps-adenomas/ssp 10/26/13  . Diverticulosis 10/26/13    mild  . Shortness of breath   . Hypertension     Pt reports that Dr. Cathlean Cower placed him on BP meds temporarily but no longer takes.  Marland Kitchen PONV (postoperative nausea and vomiting)     Pt reports nausea only after cardiac cath  . Anxiety   . CKD (chronic kidney disease), stage III   . Cancer     "precancer; my colon and esophagus" (01/15/2014)  . AAA (abdominal aortic aneurysm)   . History of blood transfusion 01/14/2014    related to AAA repair/notes 01/15/2014    Past Surgical History  Procedure Laterality Date  . Pilonidal cyst / sinus excision     . Esophagogastroduodenoscopy    . Colonoscopy w/ biopsies    . Abdominal aortic aneurysm repair N/A 01/14/2014    Procedure: ABDOMINAL AORTIC ANEURYSM WITH BI ILIAC REPAIR; PRIMARY REPAIR OF UMBILICAL HERNIA;  Surgeon: Serafina Mitchell, MD;  Location: [facility];  Service: Vascular;  Laterality: N/A;  . Hernia repair      "belly"  . Cardiac catheterization  06-16-2009    Dr. Johnsie Cancel  . Esophageal dilation      "once"    History   Social History  . Marital Status: Divorced    Spouse Name: N/A    Number of Children: N/A  . Years of Education: N/A   Occupational History  . disabled    Social History Main Topics  . Smoking status: Former Smoker -- 2.50 packs/day for 40 years    Types: Cigarettes    Quit date: 12/30/2013  . Smokeless tobacco: Never Used  . Alcohol Use: Yes     Comment: 01/15/2014 "drink very little"  . Drug Use: No  . Sexual Activity: Not Currently   Other Topics Concern  . Not on file   Social History Narrative  . No narrative on file    Family History  Problem Relation Age of Onset  . Hyperlipidemia Mother   . Hypertension Mother   . Varicose Veins Mother   . Diabetes Father   . Heart disease Father     before age  17  . Heart attack Father   . Cancer Sister   . Hyperlipidemia Sister   . Hypertension Sister   . Cancer Brother   . Diabetes Brother   . Hyperlipidemia Brother   . Hypertension Brother     Allergies as of 05/03/2014 - Review Complete 05/03/2014  Allergen Reaction Noted  . Sulfonamide derivatives      Current Outpatient Prescriptions on File Prior to Visit  Medication Sig Dispense Refill  . divalproex (DEPAKOTE) 500 MG DR tablet Take 2 tablets (1,000 mg total) by mouth at bedtime.  60 tablet  1  . FLUoxetine (PROZAC) 20 MG capsule Take 1 capsule (20 mg total) by mouth at bedtime.  30 capsule  3  . haloperidol (HALDOL) 2 MG tablet Take 1 tablet (2 mg total) by mouth at bedtime.  30 tablet  1  . loperamide (IMODIUM) 2 MG capsule Take 1  capsule (2 mg total) by mouth 4 (four) times daily as needed for diarrhea or loose stools.  12 capsule  0  . metoprolol tartrate (LOPRESSOR) 25 MG tablet Take 1 tablet (25 mg total) by mouth 2 (two) times daily.  60 tablet  5  . mometasone-formoterol (DULERA) 100-5 MCG/ACT AERO Inhale 2 puffs into the lungs 2 (two) times daily.      . nicotine (NICODERM CQ - DOSED IN MG/24 HOURS) 14 mg/24hr patch 14 mg patch daily x 3 weeks then 7 mg patch daily for 3 weeks and stop  28 patch  0  . omeprazole (PRILOSEC) 40 MG capsule Take 1 capsule (40 mg total) by mouth daily before supper.  30 capsule  11  . simvastatin (ZOCOR) 40 MG tablet Take 1 tablet (40 mg total) by mouth at bedtime.  30 tablet  1  . tiotropium (SPIRIVA) 18 MCG inhalation capsule Place 18 mcg into inhaler and inhale daily.       No current facility-administered medications on file prior to visit.     REVIEW OF SYSTEMS: Cardiovascular: No chest pain, chest pressure, palpitations, orthopnea, or dyspnea on exertion. No claudication or rest pain,  No history of DVT or phlebitis. Pulmonary: No productive cough, asthma or wheezing. Neurologic: No weakness, paresthesias, aphasia, or amaurosis. No dizziness. Hematologic: No bleeding problems or clotting disorders. Musculoskeletal: No joint pain or joint swelling. Gastrointestinal: No blood in stool or hematemesis Genitourinary: No dysuria or hematuria. Psychiatric:: No history of major depression. Integumentary: No rashes or ulcers. Constitutional: No fever or chills.  PHYSICAL EXAMINATION:   Vital signs are BP 102/69  Pulse 68  Ht 5' 8.5" (1.74 m)  Wt 160 lb (72.576 kg)  BMI 23.97 kg/m2  SpO2 95% General: The patient appears their stated age. HEENT:  No gross abnormalities Pulmonary:  Non labored breathing Abdomen: Soft and non-tender midline incision is healed without hernia Musculoskeletal: There are no major deformities. Neurologic: No focal weakness or paresthesias are  detected, Skin: There are no ulcer or rashes noted. Psychiatric: The patient has normal affect. Cardiovascular: There is a regular rate and rhythm without significant murmur appreciated.  Palpable pedal pulses   Diagnostic Studies I have reviewed his ultrasound studies today which showed triphasic bilateral waveforms.  Assessment: Status post open abdominal aortic aneurysm repair Plan: Patient is doing very well.  He has excellent blood flow to his feet.  There were no incisional complications.  His preoperative CT scan showed a pulmonary nodule for which one year followup was recommended.  He will see me back in 6 months with  a CT scan of his chest to evaluate this nodule.  I will plan on seeing him in 3 years after his 6 month visit to evaluate for proximal or distal aneurysmal degeneration of his repair.  Eldridge Abrahams, M.D. [facility] Office: [phone] Pager:  [phone]

## 2014-05-04 ENCOUNTER — Ambulatory Visit: Payer: Medicare Other | Admitting: Internal Medicine

## 2014-09-24 ENCOUNTER — Other Ambulatory Visit: Payer: Self-pay | Admitting: Internal Medicine

## 2014-10-29 ENCOUNTER — Ambulatory Visit: Payer: Medicare Other | Admitting: Internal Medicine

## 2014-10-29 ENCOUNTER — Encounter: Payer: Self-pay | Admitting: Surgery

## 2014-11-01 ENCOUNTER — Encounter: Payer: Self-pay | Admitting: Internal Medicine

## 2014-11-01 ENCOUNTER — Ambulatory Visit: Payer: Medicare Other | Admitting: Surgery

## 2014-11-01 ENCOUNTER — Inpatient Hospital Stay: Admission: RE | Admit: 2014-11-01 | Payer: Medicare Other | Source: Ambulatory Visit

## 2014-11-04 ENCOUNTER — Encounter: Payer: Self-pay | Admitting: Internal Medicine

## 2014-11-04 ENCOUNTER — Ambulatory Visit (INDEPENDENT_AMBULATORY_CARE_PROVIDER_SITE_OTHER): Payer: Medicare Other | Admitting: Internal Medicine

## 2014-11-04 ENCOUNTER — Other Ambulatory Visit (INDEPENDENT_AMBULATORY_CARE_PROVIDER_SITE_OTHER): Payer: Medicare Other

## 2014-11-04 ENCOUNTER — Encounter: Payer: Self-pay | Admitting: Surgery

## 2014-11-04 VITALS — BP 118/86 | HR 76 | Temp 99.6°F | Resp 18 | Ht 69.0 in | Wt 138.1 lb

## 2014-11-04 DIAGNOSIS — E782 Mixed hyperlipidemia: Secondary | ICD-10-CM | POA: Diagnosis not present

## 2014-11-04 DIAGNOSIS — T50905D Adverse effect of unspecified drugs, medicaments and biological substances, subsequent encounter: Secondary | ICD-10-CM

## 2014-11-04 DIAGNOSIS — I10 Essential (primary) hypertension: Secondary | ICD-10-CM | POA: Diagnosis not present

## 2014-11-04 DIAGNOSIS — E871 Hypo-osmolality and hyponatremia: Secondary | ICD-10-CM

## 2014-11-04 DIAGNOSIS — F17200 Nicotine dependence, unspecified, uncomplicated: Secondary | ICD-10-CM

## 2014-11-04 DIAGNOSIS — Z720 Tobacco use: Secondary | ICD-10-CM

## 2014-11-04 DIAGNOSIS — N320 Bladder-neck obstruction: Secondary | ICD-10-CM | POA: Diagnosis not present

## 2014-11-04 LAB — URINALYSIS, ROUTINE W REFLEX MICROSCOPIC
Bilirubin Urine: NEGATIVE
Hgb urine dipstick: NEGATIVE
Ketones, ur: NEGATIVE
Leukocytes, UA: NEGATIVE
Nitrite: NEGATIVE
PH: 6 (ref 5.0–8.0)
Specific Gravity, Urine: 1.01 (ref 1.000–1.030)
TOTAL PROTEIN, URINE-UPE24: 30 — AB
Urine Glucose: NEGATIVE
Urobilinogen, UA: 0.2 (ref 0.0–1.0)

## 2014-11-04 LAB — PSA: PSA: 0.98 ng/mL (ref 0.10–4.00)

## 2014-11-04 LAB — CBC WITH DIFFERENTIAL/PLATELET
Basophils Absolute: 0.1 10*3/uL (ref 0.0–0.1)
Basophils Relative: 1.5 % (ref 0.0–3.0)
Eosinophils Absolute: 0.3 10*3/uL (ref 0.0–0.7)
Eosinophils Relative: 5.3 % — ABNORMAL HIGH (ref 0.0–5.0)
HCT: 36.3 % — ABNORMAL LOW (ref 39.0–52.0)
Hemoglobin: 12.6 g/dL — ABNORMAL LOW (ref 13.0–17.0)
LYMPHS PCT: 21.2 % (ref 12.0–46.0)
Lymphs Abs: 1.2 10*3/uL (ref 0.7–4.0)
MCHC: 34.6 g/dL (ref 30.0–36.0)
MCV: 102.2 fl — AB (ref 78.0–100.0)
Monocytes Absolute: 0.7 10*3/uL (ref 0.1–1.0)
Monocytes Relative: 12 % (ref 3.0–12.0)
NEUTROS PCT: 60 % (ref 43.0–77.0)
Neutro Abs: 3.5 10*3/uL (ref 1.4–7.7)
PLATELETS: 223 10*3/uL (ref 150.0–400.0)
RBC: 3.55 Mil/uL — ABNORMAL LOW (ref 4.22–5.81)
RDW: 14.6 % (ref 11.5–15.5)
WBC: 5.9 10*3/uL (ref 4.0–10.5)

## 2014-11-04 LAB — HEPATIC FUNCTION PANEL
ALT: 20 U/L (ref 0–53)
AST: 24 U/L (ref 0–37)
Albumin: 3.8 g/dL (ref 3.5–5.2)
Alkaline Phosphatase: 68 U/L (ref 39–117)
Bilirubin, Direct: 0 mg/dL (ref 0.0–0.3)
TOTAL PROTEIN: 6.6 g/dL (ref 6.0–8.3)
Total Bilirubin: 0.3 mg/dL (ref 0.2–1.2)

## 2014-11-04 LAB — BASIC METABOLIC PANEL
BUN: 30 mg/dL — AB (ref 6–23)
CHLORIDE: 104 meq/L (ref 96–112)
CO2: 27 mEq/L (ref 19–32)
Calcium: 8.6 mg/dL (ref 8.4–10.5)
Creatinine, Ser: 2.37 mg/dL — ABNORMAL HIGH (ref 0.40–1.50)
GFR: 30.27 mL/min — AB (ref >60.00)
Glucose, Bld: 91 mg/dL (ref 70–99)
Potassium: 4.6 mEq/L (ref 3.5–5.1)
Sodium: 135 mEq/L (ref 135–145)

## 2014-11-04 LAB — LIPID PANEL
CHOL/HDL RATIO: 3
Cholesterol: 150 mg/dL (ref 0–200)
HDL: 59.7 mg/dL (ref >39.00)
NonHDL: 90.3
Triglycerides: 230 mg/dL — ABNORMAL HIGH (ref 0.0–149.0)
VLDL: 46 mg/dL — ABNORMAL HIGH (ref 0.0–40.0)

## 2014-11-04 LAB — LDL CHOLESTEROL, DIRECT: Direct LDL: 60 mg/dL

## 2014-11-04 LAB — TSH: TSH: 0.7 u[IU]/mL (ref 0.35–4.50)

## 2014-11-04 NOTE — Assessment & Plan Note (Signed)
stable overall by history and exam, recent data reviewed with pt, and pt to continue medical treatment as before,  to f/u any worsening symptoms or concerns Lab Results  Component Value Date   LDLCALC 99 11/04/2013

## 2014-11-04 NOTE — Progress Notes (Signed)
Pre visit review using our clinic review tool, if applicable. No additional management support is needed unless otherwise documented below in the visit note. 

## 2014-11-04 NOTE — Progress Notes (Signed)
Subjective:    Patient ID: Ruben Blackwell, male    DOB: 17-Sep-1955, 59 y.o.   MRN: [id number]  HPI  Here to f/u; overall doing ok,  Pt denies chest pain, increasing sob or doe, wheezing, orthopnea, PND, increased LE swelling, palpitations, dizziness or syncope.  Pt denies new neurological symptoms such as new headache, or facial or extremity weakness or numbness.  Pt denies polydipsia, polyuria, or low sugar episode.   Pt denies new neurological symptoms such as new headache, or facial or extremity weakness or numbness.   Pt states overall good compliance with meds, mostly trying to follow appropriate diet, with wt overall stable,  but little exercise however.  Asks for cxr and lab f/u.  Has lost wk with diet change he states as he cut out sugar soft drinks.  Has f/u with Dr Trula Slade and GI as well Wt Readings from Last 3 Encounters:  11/04/14 138 lb 1.9 oz (62.651 kg)  05/03/14 160 lb (72.576 kg)  04/30/14 159 lb 2 oz (72.179 kg)  Still smoking - now at 2 ppd for 32 yrs; qualifies for LDCT lung cancer screening but Has CT chest planned for mar 28 already per Dr Trula Slade  Past Medical History  Diagnosis Date  . Impaired glucose tolerance 08/27/2011  . BIPOLAR DISORDER UNSPECIFIED 05/25/2009  . COPD 08/22/2010  . CORONARY ARTERY DISEASE 10/20/2009  . HYPERLIPIDEMIA 05/20/2007  . PARKINSON'S DISEASE 05/25/2009  . 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 TARDIVE DYSKINESIA 05/20/2007  . DEPRESSION 08/18/2008  . GERD 12/01/2008  . HYPERTHYROIDISM 08/18/2008  . Unspecified chronic bronchitis 10/09/2012  . Peripheral vascular disease   . Barrett's esophagus 10/26/13    Dx EGD 10/2013  . Personal history of colonic polyps-adenomas/ssp 10/26/13  . Diverticulosis 10/26/13    mild  . Shortness of breath   . Hypertension     Pt reports that Dr. Cathlean Cower placed him on BP meds temporarily but no longer takes.  Marland Kitchen PONV (postoperative nausea and vomiting)     Pt reports nausea only after cardiac cath  .  Anxiety   . CKD (chronic kidney disease), stage III   . Cancer     "precancer; my colon and esophagus" (01/15/2014)  . AAA (abdominal aortic aneurysm)   . History of blood transfusion 01/14/2014    related to AAA repair/notes 01/15/2014   Past Surgical History  Procedure Laterality Date  . Pilonidal cyst / sinus excision    . Esophagogastroduodenoscopy    . Colonoscopy w/ biopsies    . Abdominal aortic aneurysm repair N/A 01/14/2014    Procedure: ABDOMINAL AORTIC ANEURYSM WITH BI ILIAC REPAIR; PRIMARY REPAIR OF UMBILICAL HERNIA;  Surgeon: Serafina Mitchell, MD;  Location: [facility];  Service: Vascular;  Laterality: N/A;  . Hernia repair      "belly"  . Cardiac catheterization  06-16-2009    Dr. Johnsie Cancel  . Esophageal dilation      "once"    reports that he quit smoking about 10 months ago. His smoking use included Cigarettes. He has a 100 pack-year smoking history. He has never used smokeless tobacco. He reports that he drinks alcohol. He reports that he does not use illicit drugs. family history includes Cancer in his brother and sister; Diabetes in his brother and father; Heart attack in his father; Heart disease in his father; Hyperlipidemia in his brother, mother, and sister; Hypertension in his brother, mother, and sister; Varicose Veins in his mother. Allergies  Allergen Reactions  . Sulfonamide  Derivatives     REACTION: Reaction not known   Current Outpatient Prescriptions on File Prior to Visit  Medication Sig Dispense Refill  . divalproex (DEPAKOTE) 500 MG DR tablet Take 2 tablets (1,000 mg total) by mouth at bedtime. 60 tablet 1  . FLUoxetine (PROZAC) 20 MG capsule Take 1 capsule (20 mg total) by mouth at bedtime. 30 capsule 3  . haloperidol (HALDOL) 2 MG tablet Take 1 tablet (2 mg total) by mouth at bedtime. 30 tablet 1  . loperamide (IMODIUM) 2 MG capsule Take 1 capsule (2 mg total) by mouth 4 (four) times daily as needed for diarrhea or loose stools. 12 capsule 0  . metoprolol tartrate  (LOPRESSOR) 25 MG tablet TAKE ONE TABLET BY MOUTH TWICE DAILY 60 tablet 11  . mometasone-formoterol (DULERA) 100-5 MCG/ACT AERO Inhale 2 puffs into the lungs 2 (two) times daily.    . nicotine (NICODERM CQ - DOSED IN MG/24 HOURS) 14 mg/24hr patch 14 mg patch daily x 3 weeks then 7 mg patch daily for 3 weeks and stop 28 patch 0  . simvastatin (ZOCOR) 40 MG tablet Take 1 tablet (40 mg total) by mouth at bedtime. 30 tablet 1  . tiotropium (SPIRIVA) 18 MCG inhalation capsule Place 18 mcg into inhaler and inhale daily.     No current facility-administered medications on file prior to visit.   Review of Systems  Constitutional: Negative for unusual diaphoresis or night sweats HENT: Negative for ringing in ear or discharge Eyes: Negative for double vision or worsening visual disturbance.  Respiratory: Negative for choking and stridor.   Gastrointestinal: Negative for vomiting or other signifcant bowel change Genitourinary: Negative for hematuria or change in urine volume.  Musculoskeletal: Negative for other MSK pain or swelling Skin: Negative for color change and worsening wound.  Neurological: Negative for tremors and numbness other than noted  Psychiatric/Behavioral: Negative for decreased concentration or agitation other than above       Objective:   Physical Exam BP 118/86 mmHg  Pulse 76  Temp(Src) 99.6 F (37.6 C) (Oral)  Resp 18  Ht 5\' 9"  (1.753 m)  Wt 138 lb 1.9 oz (62.651 kg)  BMI 20.39 kg/m2  SpO2 96% VS noted,  Constitutional: Pt appears in no significant distress HENT: Head: NCAT.  Right Ear: External ear normal.  Left Ear: External ear normal.  Eyes: . Pupils are equal, round, and reactive to light. Conjunctivae and EOM are normal Neck: Normal range of motion. Neck supple.  Cardiovascular: Normal rate and regular rhythm.   Pulmonary/Chest: Effort normal and breath sounds without rales or wheezing.  Abd:  Soft, NT, ND, + BS Neurological: Pt is alert. Not confused ,  motor grossly intact, with typical TD shaking as before Skin: Skin is warm. No rash, no LE edema Psychiatric: Pt behavior is normal. No agitation.     Assessment & Plan:

## 2014-11-04 NOTE — Assessment & Plan Note (Addendum)
Also for CT chest as planned, urged to quit

## 2014-11-04 NOTE — Assessment & Plan Note (Addendum)
stable overall by history and exam, recent data reviewed with pt, and pt to continue medical treatment as before,  to f/u any worsening symptoms or concerns BP Readings from Last 3 Encounters:  11/04/14 118/86  05/03/14 102/69  04/30/14 120/72  ECG reviewed as per emr   Note:  Total time for pt hx, exam, review of record with pt in the room, determination of diagnoses and plan for further eval and tx is > 40 min, with over 50% spent in coordination and counseling of patient

## 2014-11-04 NOTE — Patient Instructions (Signed)
Your EKG was OK today  Please continue all other medications as before, and refills have been done if requested.  Please have the pharmacy call with any other refills you may need.  Please continue your efforts at being more active, low cholesterol diet, and weight control.  You are otherwise up to date with prevention measures today.  Please keep your appointments with your specialists as you may have planned  You will be contacted regarding the referral for: CT scan for chest (to see [facility] today)  Please go to the LAB in the Basement (turn left off the elevator) for the tests to be done today  You will be contacted by phone if any changes need to be made immediately.  Otherwise, you will receive a letter about your results with an explanation, but please check with MyChart first.  Please remember to sign up for MyChart if you have not done so, as this will be important to you in the future with finding out test results, communicating by private email, and scheduling acute appointments online when needed.  Please return in 6 months, or sooner if needed

## 2014-11-04 NOTE — Assessment & Plan Note (Signed)
With improved polydipsia per pt, for f/u lab,  to f/u any worsening symptoms or concerns

## 2014-11-05 LAB — VALPROIC ACID LEVEL: VALPROIC ACID LVL: 49.9 ug/mL — AB (ref 50.0–100.0)

## 2014-11-08 ENCOUNTER — Encounter: Payer: Self-pay | Admitting: Surgery

## 2014-11-08 ENCOUNTER — Ambulatory Visit
Admission: RE | Admit: 2014-11-08 | Discharge: 2014-11-08 | Disposition: A | Discharge status: Home / Self Care | Payer: Medicare Other | Source: Ambulatory Visit | Attending: Surgery | Admitting: Surgery

## 2014-11-08 ENCOUNTER — Ambulatory Visit (INDEPENDENT_AMBULATORY_CARE_PROVIDER_SITE_OTHER): Payer: Medicare Other | Admitting: Surgery

## 2014-11-08 VITALS — BP 100/63 | HR 84 | Ht 69.0 in | Wt 139.0 lb

## 2014-11-08 DIAGNOSIS — R911 Solitary pulmonary nodule: Secondary | ICD-10-CM

## 2014-11-08 DIAGNOSIS — I714 Abdominal aortic aneurysm, without rupture, unspecified: Secondary | ICD-10-CM

## 2014-11-08 NOTE — Progress Notes (Signed)
Patient name: Ruben Blackwell MRN: [id number] DOB: [birth year] Sex: male     Chief Complaint  Patient presents with  . Re-evaluation    6 month f/u     HISTORY OF PRESENT ILLNESS: On 01/14/2014, he underwent open repair of infrarenal abdominal aortic aneurysm using a bifurcated 20 x 10 dacryon graft. He also had primary repair of an umbilical hernia. The patient had significant pulmonary insufficiency. He was evaluated preoperatively by pulmonology. He did require an extra day on the ventilator but was ultimately extubated.  He is doing very well from his vascular perspective.  He comes back today for follow-up of a pulmonary nodule seen on initial imaging studies  Past Medical History  Diagnosis Date  . Impaired glucose tolerance 08/27/2011  . BIPOLAR DISORDER UNSPECIFIED 05/25/2009  . COPD 08/22/2010  . CORONARY ARTERY DISEASE 10/20/2009  . HYPERLIPIDEMIA 05/20/2007  . PARKINSON'S DISEASE 05/25/2009  . 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 TARDIVE DYSKINESIA 05/20/2007  . DEPRESSION 08/18/2008  . GERD 12/01/2008  . HYPERTHYROIDISM 08/18/2008  . Unspecified chronic bronchitis 10/09/2012  . Peripheral vascular disease   . Barrett's esophagus 10/26/13    Dx EGD 10/2013  . Personal history of colonic polyps-adenomas/ssp 10/26/13  . Diverticulosis 10/26/13    mild  . Shortness of breath   . Hypertension     Pt reports that Dr. Cathlean Cower placed him on BP meds temporarily but no longer takes.  Marland Kitchen PONV (postoperative nausea and vomiting)     Pt reports nausea only after cardiac cath  . Anxiety   . CKD (chronic kidney disease), stage III   . Cancer     "precancer; my colon and esophagus" (01/15/2014)  . AAA (abdominal aortic aneurysm)   . History of blood transfusion 01/14/2014    related to AAA repair/notes 01/15/2014    Past Surgical History  Procedure Laterality Date  . Pilonidal cyst / sinus excision    . Esophagogastroduodenoscopy    . Colonoscopy w/ biopsies    . Abdominal  aortic aneurysm repair N/A 01/14/2014    Procedure: ABDOMINAL AORTIC ANEURYSM WITH BI ILIAC REPAIR; PRIMARY REPAIR OF UMBILICAL HERNIA;  Surgeon: Serafina Mitchell, MD;  Location: [facility];  Service: Vascular;  Laterality: N/A;  . Hernia repair      "belly"  . Cardiac catheterization  06-16-2009    Dr. Johnsie Cancel  . Esophageal dilation      "once"    History   Social History  . Marital Status: Divorced    Spouse Name: N/A  . Number of Children: N/A  . Years of Education: N/A   Occupational History  . disabled    Social History Main Topics  . Smoking status: Former Smoker -- 2.50 packs/day for 40 years    Types: Cigarettes    Quit date: 12/30/2013  . Smokeless tobacco: Never Used  . Alcohol Use: Yes     Comment: 01/15/2014 "drink very little"  . Drug Use: No  . Sexual Activity: Not Currently   Other Topics Concern  . Not on file   Social History Narrative    Family History  Problem Relation Age of Onset  . Hyperlipidemia Mother   . Hypertension Mother   . Varicose Veins Mother   . Diabetes Father   . Heart disease Father     before age 16  . Heart attack Father   . Cancer Sister   . Hyperlipidemia Sister   . Hypertension Sister   . Cancer Brother   .  Diabetes Brother   . Hyperlipidemia Brother   . Hypertension Brother     Allergies as of 11/08/2014 - Review Complete 11/08/2014  Allergen Reaction Noted  . Sulfonamide derivatives      Current Outpatient Prescriptions on File Prior to Visit  Medication Sig Dispense Refill  . divalproex (DEPAKOTE) 500 MG DR tablet Take 2 tablets (1,000 mg total) by mouth at bedtime. 60 tablet 1  . FLUoxetine (PROZAC) 20 MG capsule Take 1 capsule (20 mg total) by mouth at bedtime. 30 capsule 3  . haloperidol (HALDOL) 2 MG tablet Take 1 tablet (2 mg total) by mouth at bedtime. 30 tablet 1  . loperamide (IMODIUM) 2 MG capsule Take 1 capsule (2 mg total) by mouth 4 (four) times daily as needed for diarrhea or loose stools. 12 capsule 0  .  metoprolol tartrate (LOPRESSOR) 25 MG tablet TAKE ONE TABLET BY MOUTH TWICE DAILY 60 tablet 11  . mometasone-formoterol (DULERA) 100-5 MCG/ACT AERO Inhale 2 puffs into the lungs 2 (two) times daily.    . nicotine (NICODERM CQ - DOSED IN MG/24 HOURS) 14 mg/24hr patch 14 mg patch daily x 3 weeks then 7 mg patch daily for 3 weeks and stop 28 patch 0  . simvastatin (ZOCOR) 40 MG tablet Take 1 tablet (40 mg total) by mouth at bedtime. 30 tablet 1  . tiotropium (SPIRIVA) 18 MCG inhalation capsule Place 18 mcg into inhaler and inhale daily.     No current facility-administered medications on file prior to visit.     REVIEW OF SYSTEMS: No changes from prior visit  PHYSICAL EXAMINATION:   Vital signs are  Filed Vitals:   11/08/14 1356 11/08/14 1358  BP: 164/94 100/63  Pulse: 84   Height: 5\' 9"  (1.753 m)   Weight: 139 lb (63.05 kg)   SpO2: 100%    Body mass index is 20.52 kg/(m^2). General: The patient appears their stated age. HEENT:  No gross abnormalities Pulmonary:  Non labored breathing Abdomen: Soft and non-tender Musculoskeletal: There are no major deformities. Neurologic: No focal weakness or paresthesias are detected, Skin: There are no ulcer or rashes noted. Psychiatric: The patient has normal affect.    Diagnostic Studies CT scan was reviewed today.  The originally seen nodule is unchanged and thought to be benign.  There are no new nodules.  Assessment: #1: Pulmonary nodule #2: Abdominal aortic aneurysm Plan: #1: CT scan today shows no significant change in his lung nodule, it is therefore considered benign.  No further follow-up will be done.  #2: I have scheduled the patient for CT angiogram in 3 years to evaluate for proximal and distal anastomosis aneurysmal degeneration  V. Leia Alf, M.D. [facility] Office: [phone] Pager:  [phone]

## 2014-11-08 NOTE — Addendum Note (Signed)
Addended by: Mena Goes on: 11/08/2014 04:37 PM   Modules accepted: Orders

## 2014-11-19 IMAGING — CR DG CHEST 2V
2 series · 2 of 2 positions shown · non-contrast
Comparison: CT chest 12/21/2013.

CLINICAL DATA: Hypertension.  Preoperative chest x-ray.

EXAM:
CHEST  2 VIEW

[w chest pa]
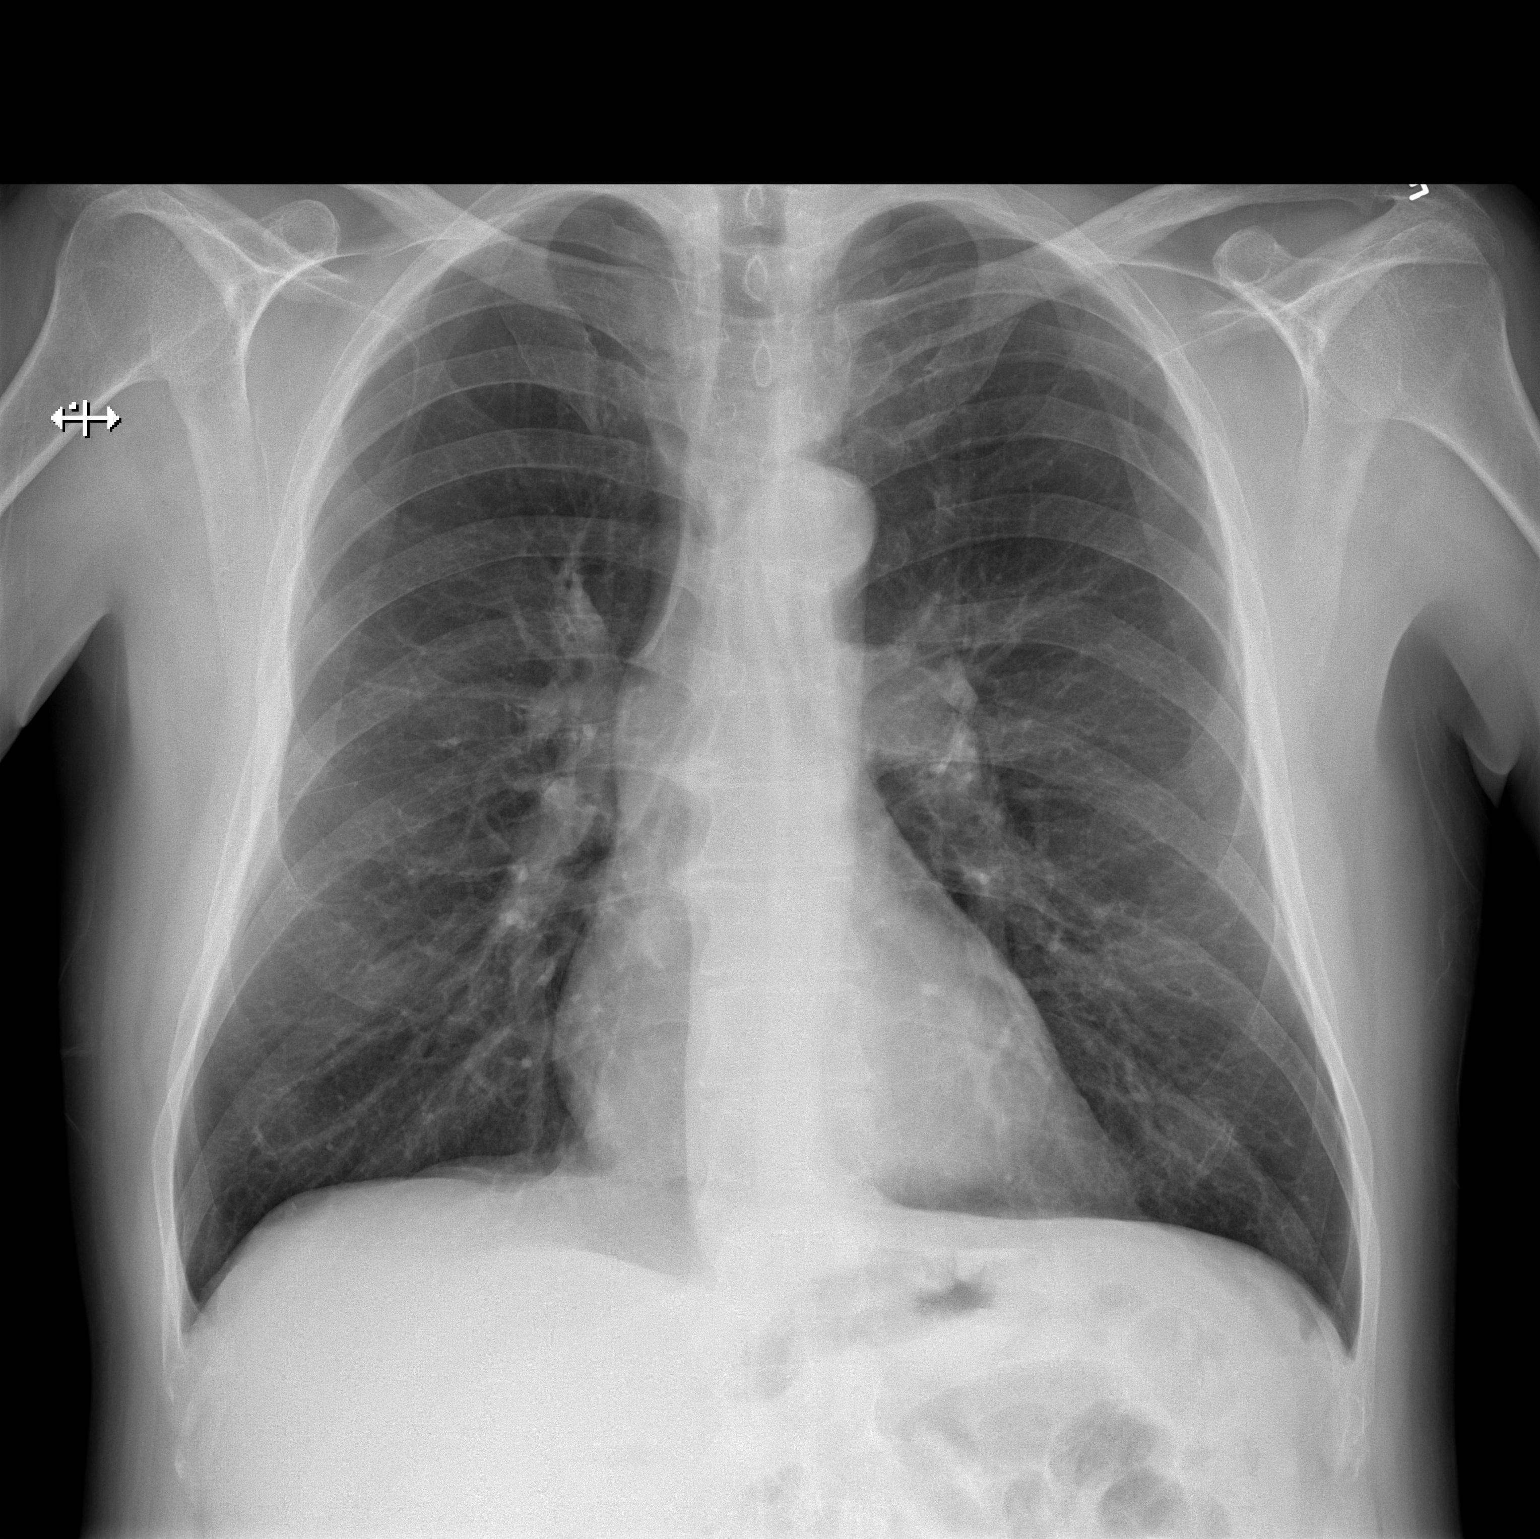

[w chest lat]
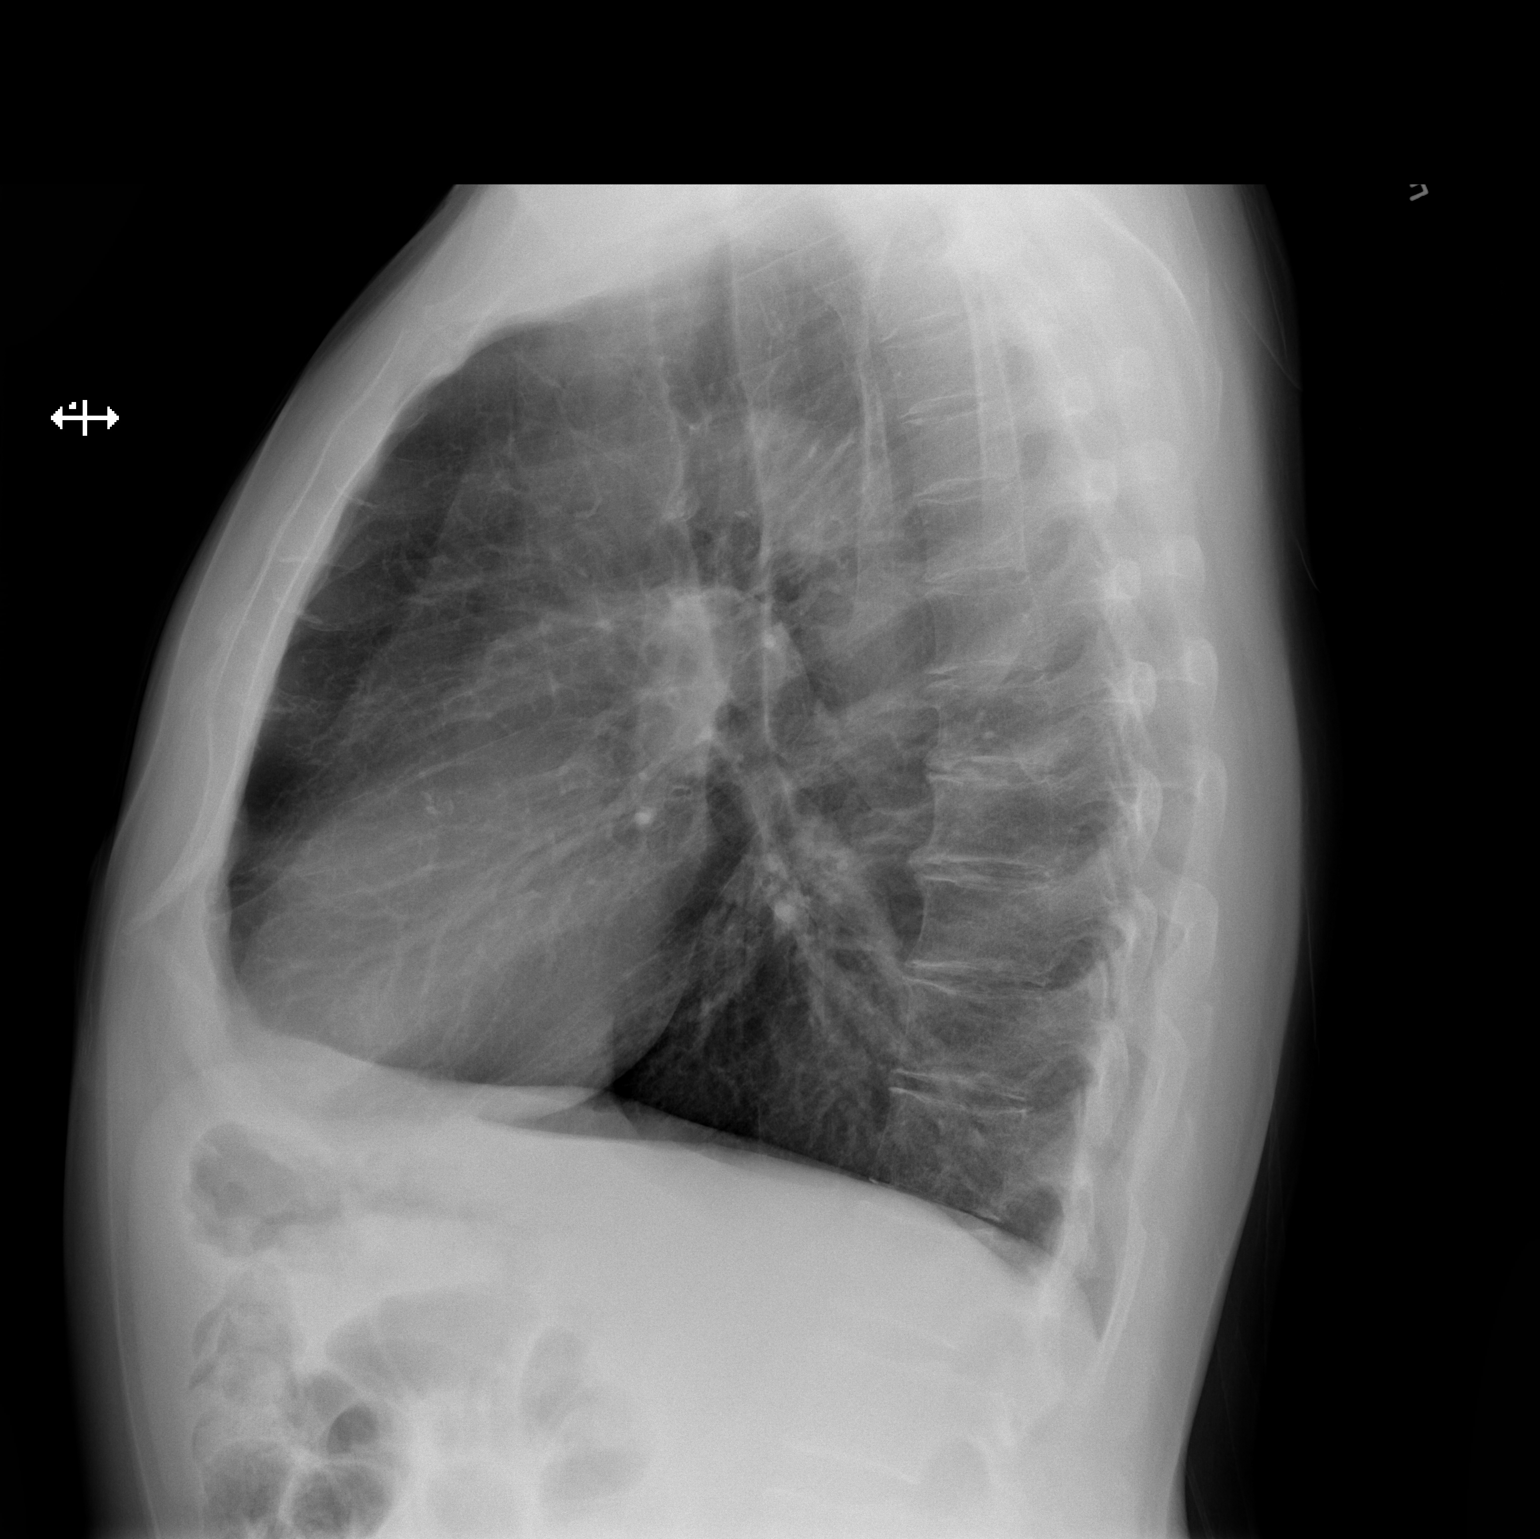

[2 of 2 positions shown; findings below may reference images not displayed]

FINDINGS: Mediastinum and hilar structures are normal. Lungs are clear of
acute infiltrates. No focal mass. No pleural effusion or
pneumothorax. Heart size normal. No acute bony abnormality.
Degenerative changes thoracic spine. Reference made to chest CT
report of 12/21/2013.
IMPRESSION: No active cardiopulmonary disease. Reference made to chest CT report
of 12/21/2013 .

## 2014-11-28 IMAGING — CR DG CHEST 1V PORT
1 series · 1 of 1 positions shown · non-contrast
Comparison: 01/15/2014

CLINICAL DATA: Check ETT

EXAM:
PORTABLE CHEST - 1 VIEW

[AP]
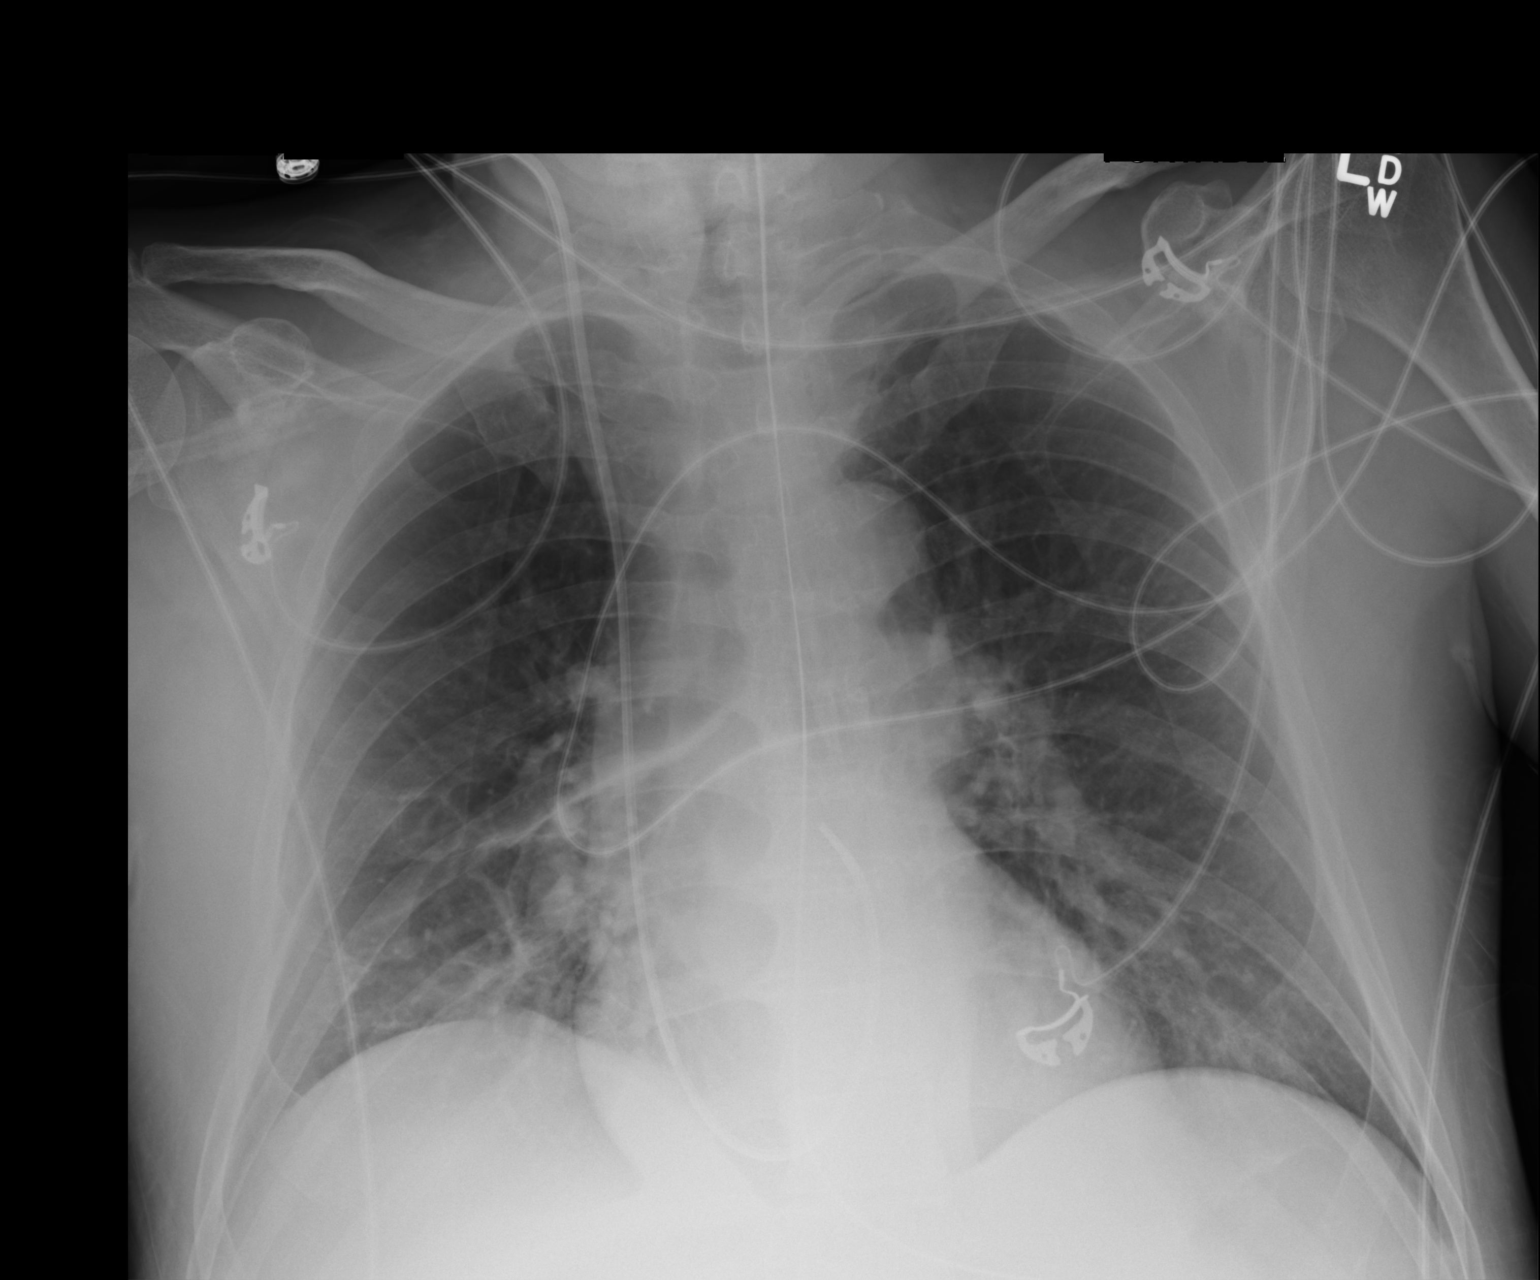

[1 of 1 positions shown; findings below may reference images not displayed]

FINDINGS: Mild patchy bilateral lower lobe opacities, likely atelectasis. No
pleural effusion or pneumothorax.

The heart is normal in size.

Interval extubation. Stable right IJ venous catheter terminating in
the main pulmonary artery. Enteric tube courses below the diaphragm.
IMPRESSION: Interval extubation.  Additional support apparatus as above.

Mild patchy bilateral lower lobe opacities, likely atelectasis.

## 2014-12-04 IMAGING — CR DG CHEST 1V
1 series · 1 of 1 positions shown · non-contrast
Comparison: 01/20/2014

CLINICAL DATA: Follow-up right lower lobe infiltrate

EXAM:
CHEST - 1 VIEW

[w chest pa]
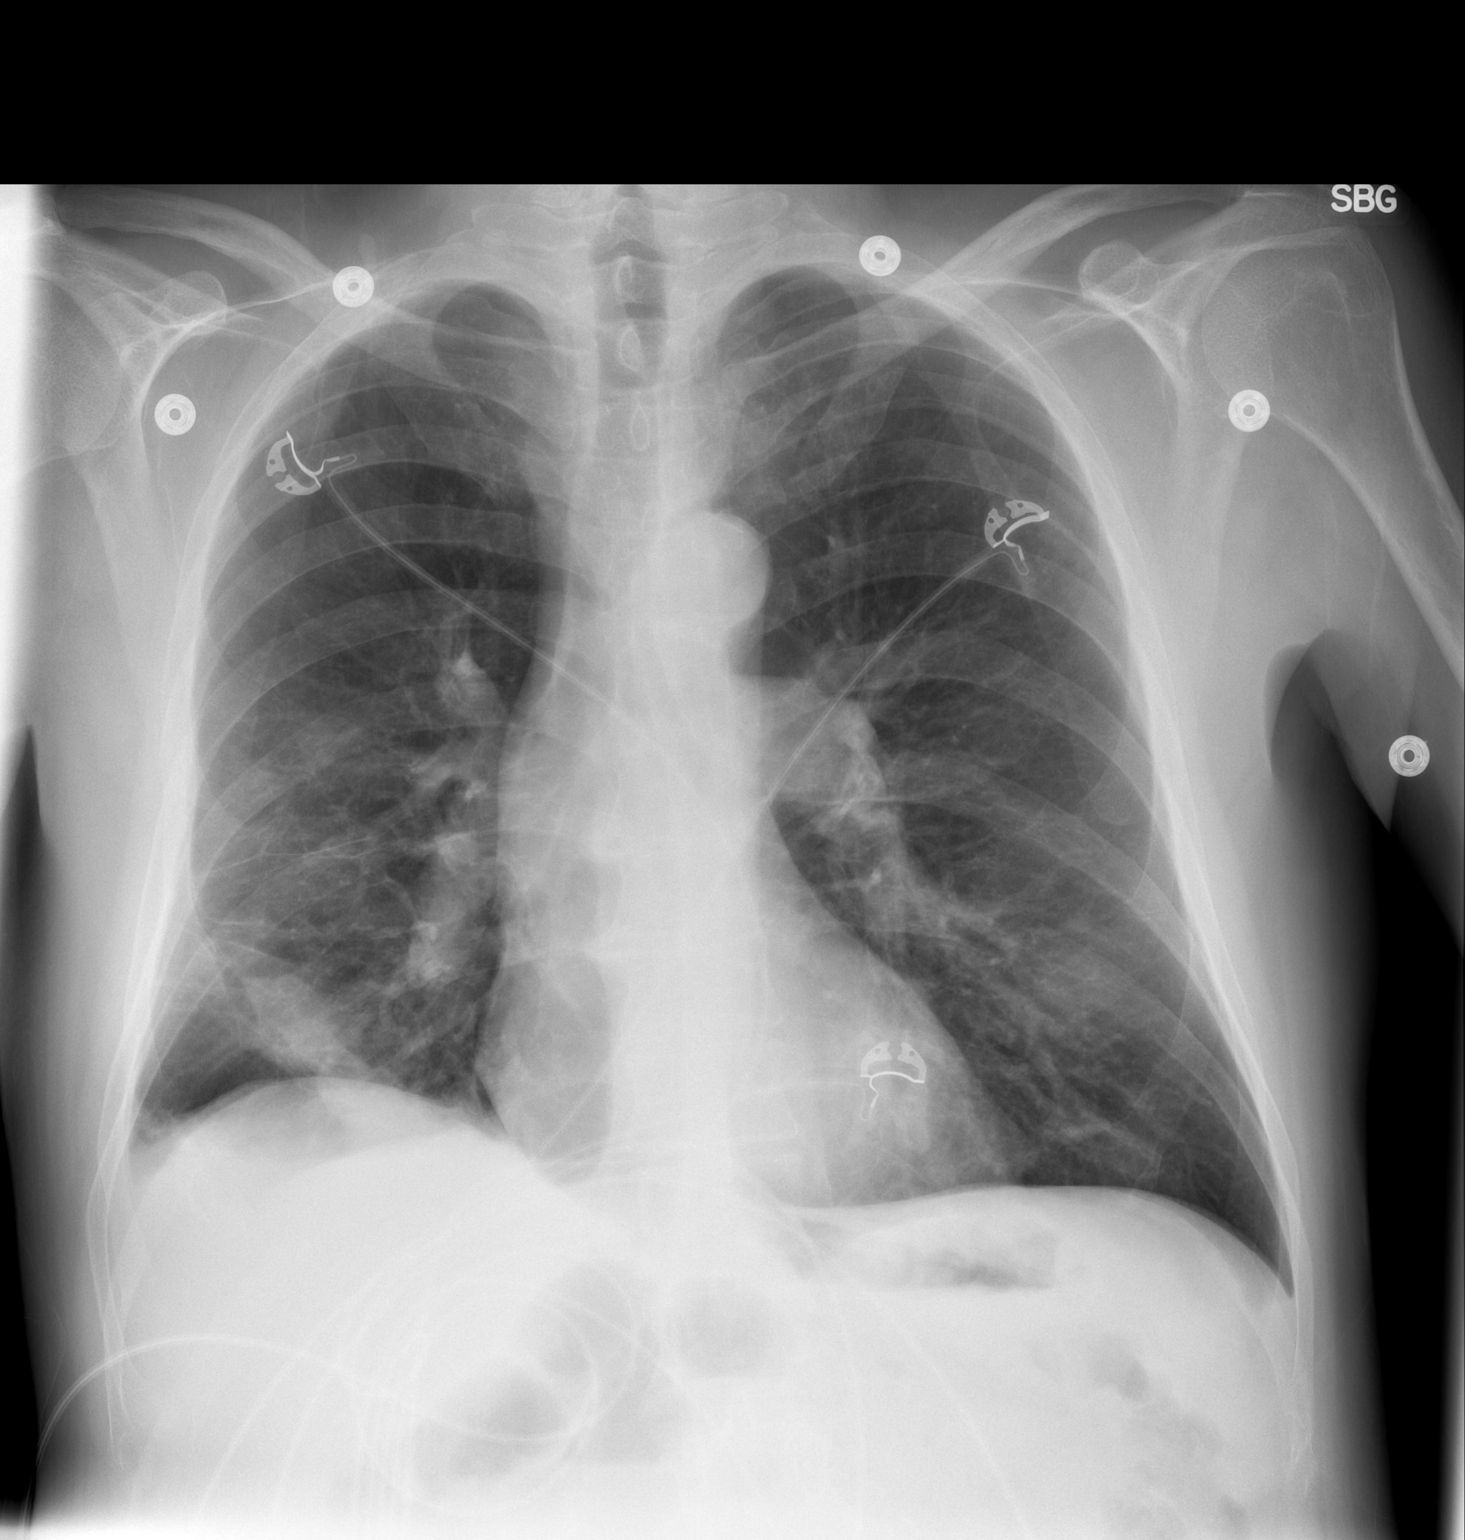

[1 of 1 positions shown; findings below may reference images not displayed]

FINDINGS: Cardiomediastinal silhouette is stable. Left lung is clear. Residual
patchy infiltrate in right lower lobe. Probable small right pleural
effusion. No pulmonary edema.
IMPRESSION: Residual patchy infiltrate in right lower lobe with slight improved
aeration. Probable small right pleural effusion. Left lung is clear.
No pulmonary edema. Follow-up to resolution recommended.

## 2014-12-12 ENCOUNTER — Other Ambulatory Visit: Payer: Self-pay | Admitting: Internal Medicine

## 2015-01-04 ENCOUNTER — Telehealth: Payer: Self-pay | Admitting: Internal Medicine

## 2015-01-04 NOTE — Telephone Encounter (Signed)
Patient want you to know he switching doctors.

## 2015-03-22 ENCOUNTER — Encounter: Payer: Self-pay | Admitting: Internal Medicine

## 2015-03-30 ENCOUNTER — Telehealth: Payer: Self-pay

## 2015-03-30 ENCOUNTER — Telehealth: Payer: Self-pay | Admitting: Internal Medicine

## 2015-04-01 ENCOUNTER — Telehealth: Payer: Self-pay

## 2015-04-01 NOTE — Telephone Encounter (Signed)
On 2015/04/26 I received a death certificate from [place] [facility] [place] death certificate is for entombment. The patient is a patient of Doctor Jenny Reichmann. The death certificate will be taken to the primary care unit at [facility] for signature this pm. On 04-26-2015 I received the death certificate back from Terri Piedra who signed the death certificate for Doctor Jenny Reichmann. I got the death certificate ready for pickup and called the funeral home to let them know the death certificate was ready for pickup.

## 2015-04-14 NOTE — Telephone Encounter (Signed)
TH RN - called today to informed us that pt had passed. EMT was called this morning and [facility] was called as well.   Paramedic: Ronalee Belts [phone] is requesting a call back in regards to PCP signing the death certificate.

## 2015-04-14 NOTE — Telephone Encounter (Signed)
Received phone call from EMS informing that patient is deceased and time of death is 3. EMS indicated that family had not heard from the patient in about 2 days and is believed to be of natural causes.

## 2015-04-14 NOTE — Telephone Encounter (Signed)
Patient Name: Ruben Blackwell  DOB: Apr 28, 1956    Initial Comment Caller states, Ronalee Belts paramedic, patient passed away    Nurse Assessment  Nurse: Cathlyn Parsons, RN, Aldona Bar Date/Time (Eastern Time): April 25, 2015 7:48:13 AM  Confirm and document reason for call. If symptomatic, describe symptoms. ---Vicenta Dunning, with EMS calling to report death. Would like to notify MD on-call.  Has the patient traveled out of the country within the last 30 days? ---Not Applicable  Does the patient require triage? ---No  Please document clinical information provided and list any resource used. ---On-Call MD paged.     Guidelines    Guideline Title Affirmed Question Affirmed Notes       Final Disposition User   Clinical Call Ruszkowski, RN, Samantha    Comments  D. Tullo returns call, information provided. Advised that I will paste the note in EPIC and call the office. She agrees

## 2015-04-14 DEATH — deceased

## 2015-05-10 ENCOUNTER — Ambulatory Visit: Payer: Medicare Other | Admitting: Internal Medicine

## 2016-01-02 ENCOUNTER — Encounter: Payer: Self-pay | Admitting: Cardiology

## 2017-08-19 ENCOUNTER — Ambulatory Visit: Payer: Medicare Other | Admitting: Family

## 2017-11-11 ENCOUNTER — Ambulatory Visit: Payer: Medicare Other | Admitting: Surgery
